# Patient Record
Sex: Male | Born: 1940
Health system: Southern US, Community
[De-identification: ages and names within clinical notes are randomized; demographics above are authoritative.]

## PROBLEM LIST (undated history)

## (undated) DIAGNOSIS — J45909 Unspecified asthma, uncomplicated: Secondary | ICD-10-CM

## (undated) DIAGNOSIS — I739 Peripheral vascular disease, unspecified: Secondary | ICD-10-CM

## (undated) DIAGNOSIS — I2510 Atherosclerotic heart disease of native coronary artery without angina pectoris: Secondary | ICD-10-CM

## (undated) DIAGNOSIS — M545 Low back pain: Secondary | ICD-10-CM

## (undated) DIAGNOSIS — H269 Unspecified cataract: Secondary | ICD-10-CM

## (undated) DIAGNOSIS — N2889 Other specified disorders of kidney and ureter: Secondary | ICD-10-CM

## (undated) DIAGNOSIS — M1990 Unspecified osteoarthritis, unspecified site: Secondary | ICD-10-CM

## (undated) DIAGNOSIS — C189 Malignant neoplasm of colon, unspecified: Secondary | ICD-10-CM

## (undated) DIAGNOSIS — I10 Essential (primary) hypertension: Secondary | ICD-10-CM

## (undated) DIAGNOSIS — I219 Acute myocardial infarction, unspecified: Secondary | ICD-10-CM

## (undated) DIAGNOSIS — Z5189 Encounter for other specified aftercare: Secondary | ICD-10-CM

## (undated) DIAGNOSIS — E785 Hyperlipidemia, unspecified: Secondary | ICD-10-CM

## (undated) DIAGNOSIS — I5032 Chronic diastolic (congestive) heart failure: Secondary | ICD-10-CM

## (undated) DIAGNOSIS — D649 Anemia, unspecified: Secondary | ICD-10-CM

## (undated) HISTORY — DX: Essential (primary) hypertension: I10

## (undated) HISTORY — DX: Other specified disorders of kidney and ureter: N28.89

## (undated) HISTORY — DX: Anemia, unspecified: D64.9

## (undated) HISTORY — DX: Malignant neoplasm of colon, unspecified: C18.9

## (undated) HISTORY — DX: Chronic diastolic (congestive) heart failure: I50.32

## (undated) HISTORY — DX: Low back pain: M54.5

## (undated) HISTORY — PX: POLYPECTOMY: SHX149

## (undated) HISTORY — DX: Unspecified osteoarthritis, unspecified site: M19.90

## (undated) HISTORY — DX: Unspecified asthma, uncomplicated: J45.909

## (undated) HISTORY — DX: Acute myocardial infarction, unspecified: I21.9

## (undated) HISTORY — DX: Peripheral vascular disease, unspecified: I73.9

## (undated) HISTORY — DX: Unspecified cataract: H26.9

## (undated) HISTORY — DX: Encounter for other specified aftercare: Z51.89

## (undated) HISTORY — DX: Hyperlipidemia, unspecified: E78.5

## (undated) HISTORY — PX: COLONOSCOPY: SHX174

## (undated) HISTORY — DX: Atherosclerotic heart disease of native coronary artery without angina pectoris: I25.10

---

## 1995-11-12 HISTORY — PX: OTHER SURGICAL HISTORY: SHX169

## 1996-01-29 ENCOUNTER — Encounter: Payer: Self-pay | Admitting: Internal Medicine

## 2007-02-10 DIAGNOSIS — N2889 Other specified disorders of kidney and ureter: Secondary | ICD-10-CM | POA: Insufficient documentation

## 2007-02-10 HISTORY — DX: Other specified disorders of kidney and ureter: N28.89

## 2007-02-10 HISTORY — PX: NEPHRECTOMY: SHX65

## 2007-02-20 ENCOUNTER — Encounter: Payer: Self-pay | Admitting: Internal Medicine

## 2007-03-23 ENCOUNTER — Encounter: Payer: Self-pay | Admitting: Internal Medicine

## 2007-03-30 ENCOUNTER — Encounter: Payer: Self-pay | Admitting: Internal Medicine

## 2007-05-21 ENCOUNTER — Encounter: Payer: Self-pay | Admitting: Internal Medicine

## 2007-11-26 ENCOUNTER — Encounter: Payer: Self-pay | Admitting: Internal Medicine

## 2007-11-26 LAB — CONVERTED CEMR LAB
Hemoglobin: 14.8 g/dL
Platelets: 259 10*3/uL
TSH: 1.81 microintl units/mL
WBC: 7.3 10*3/uL

## 2008-05-04 ENCOUNTER — Encounter: Payer: Self-pay | Admitting: Internal Medicine

## 2008-06-11 ENCOUNTER — Encounter: Payer: Self-pay | Admitting: Internal Medicine

## 2008-07-14 ENCOUNTER — Encounter: Payer: Self-pay | Admitting: Internal Medicine

## 2008-08-24 ENCOUNTER — Encounter: Payer: Self-pay | Admitting: Internal Medicine

## 2008-08-24 LAB — HM COLONOSCOPY

## 2009-01-11 ENCOUNTER — Encounter: Payer: Self-pay | Admitting: Internal Medicine

## 2009-03-08 LAB — CONVERTED CEMR LAB
ALT: 27 units/L
AST: 25 units/L
Albumin: 4 g/dL
Alkaline Phosphatase: 84 units/L
BUN: 19 mg/dL
CO2: 23 meq/L
Calcium: 9.1 mg/dL
Chloride: 108 meq/L
Cholesterol: 126 mg/dL
Creatinine, Ser: 1.57 mg/dL
Glucose, Bld: 98 mg/dL
HDL: 48 mg/dL
Hgb A1c MFr Bld: 5.9 %
LDL (calc): 62 mg/dL
Potassium: 4.5 meq/L
Sodium: 141 meq/L
Total Bilirubin: 0.7 mg/dL
Total Protein: 6.6 g/dL
Triglyceride fasting, serum: 79 mg/dL

## 2009-03-22 ENCOUNTER — Encounter: Payer: Self-pay | Admitting: Internal Medicine

## 2009-08-30 ENCOUNTER — Ambulatory Visit: Payer: Self-pay | Admitting: Internal Medicine

## 2009-08-30 ENCOUNTER — Telehealth: Payer: Self-pay | Admitting: Internal Medicine

## 2009-08-30 DIAGNOSIS — D649 Anemia, unspecified: Secondary | ICD-10-CM | POA: Insufficient documentation

## 2009-08-30 DIAGNOSIS — N289 Disorder of kidney and ureter, unspecified: Secondary | ICD-10-CM | POA: Insufficient documentation

## 2009-08-30 DIAGNOSIS — Z85038 Personal history of other malignant neoplasm of large intestine: Secondary | ICD-10-CM | POA: Insufficient documentation

## 2009-08-30 DIAGNOSIS — E785 Hyperlipidemia, unspecified: Secondary | ICD-10-CM | POA: Insufficient documentation

## 2009-08-30 DIAGNOSIS — I2510 Atherosclerotic heart disease of native coronary artery without angina pectoris: Secondary | ICD-10-CM | POA: Insufficient documentation

## 2009-08-30 DIAGNOSIS — M545 Low back pain, unspecified: Secondary | ICD-10-CM | POA: Insufficient documentation

## 2009-08-30 DIAGNOSIS — I10 Essential (primary) hypertension: Secondary | ICD-10-CM | POA: Insufficient documentation

## 2009-08-30 DIAGNOSIS — J45909 Unspecified asthma, uncomplicated: Secondary | ICD-10-CM | POA: Insufficient documentation

## 2009-08-30 DIAGNOSIS — J4520 Mild intermittent asthma, uncomplicated: Secondary | ICD-10-CM | POA: Insufficient documentation

## 2009-08-30 DIAGNOSIS — Z8719 Personal history of other diseases of the digestive system: Secondary | ICD-10-CM | POA: Insufficient documentation

## 2009-08-30 DIAGNOSIS — I739 Peripheral vascular disease, unspecified: Secondary | ICD-10-CM | POA: Insufficient documentation

## 2009-09-11 ENCOUNTER — Encounter: Payer: Self-pay | Admitting: Internal Medicine

## 2009-10-11 ENCOUNTER — Telehealth (INDEPENDENT_AMBULATORY_CARE_PROVIDER_SITE_OTHER): Payer: Self-pay | Admitting: *Deleted

## 2009-10-11 DIAGNOSIS — M545 Low back pain, unspecified: Secondary | ICD-10-CM | POA: Insufficient documentation

## 2009-10-11 HISTORY — PX: LUMBAR LAMINECTOMY/DECOMPRESSION MICRODISCECTOMY: SHX5026

## 2009-10-11 HISTORY — DX: Low back pain, unspecified: M54.50

## 2009-10-17 ENCOUNTER — Ambulatory Visit: Payer: Self-pay | Admitting: Internal Medicine

## 2009-10-20 ENCOUNTER — Telehealth: Payer: Self-pay | Admitting: Internal Medicine

## 2009-10-25 ENCOUNTER — Ambulatory Visit (HOSPITAL_COMMUNITY): Admission: RE | Admit: 2009-10-25 | Discharge: 2009-10-25 | Payer: Self-pay | Admitting: Internal Medicine

## 2009-10-26 ENCOUNTER — Telehealth: Payer: Self-pay | Admitting: Internal Medicine

## 2009-10-26 ENCOUNTER — Encounter: Payer: Self-pay | Admitting: Internal Medicine

## 2009-10-27 ENCOUNTER — Ambulatory Visit: Payer: Self-pay | Admitting: Internal Medicine

## 2009-10-27 LAB — CONVERTED CEMR LAB
ALT: 33 units/L (ref 0–53)
AST: 19 units/L (ref 0–37)
Albumin: 3.5 g/dL (ref 3.5–5.2)
Alkaline Phosphatase: 71 units/L (ref 39–117)
BUN: 15 mg/dL (ref 6–23)
Basophils Absolute: 0.1 10*3/uL (ref 0.0–0.1)
Basophils Relative: 1.1 % (ref 0.0–3.0)
Bilirubin, Direct: 0.1 mg/dL (ref 0.0–0.3)
CO2: 29 meq/L (ref 19–32)
Calcium: 9.2 mg/dL (ref 8.4–10.5)
Chloride: 104 meq/L (ref 96–112)
Creatinine, Ser: 1.4 mg/dL (ref 0.4–1.5)
Eosinophils Absolute: 0.4 10*3/uL (ref 0.0–0.7)
Eosinophils Relative: 4.9 % (ref 0.0–5.0)
GFR calc non Af Amer: 53.52 mL/min (ref >60)
Glucose, Bld: 86 mg/dL (ref 70–99)
HCT: 41.3 % (ref 39.0–52.0)
Hemoglobin: 14.2 g/dL (ref 13.0–17.0)
INR: 1 (ref 0.8–1.0)
Lymphocytes Relative: 24 % (ref 12.0–46.0)
Lymphs Abs: 1.7 10*3/uL (ref 0.7–4.0)
MCHC: 34.3 g/dL (ref 30.0–36.0)
MCV: 99.6 fL (ref 78.0–100.0)
Monocytes Absolute: 0.8 10*3/uL (ref 0.1–1.0)
Monocytes Relative: 11.8 % (ref 3.0–12.0)
Neutro Abs: 4.2 10*3/uL (ref 1.4–7.7)
Neutrophils Relative %: 58.2 % (ref 43.0–77.0)
Platelets: 229 10*3/uL (ref 150.0–400.0)
Potassium: 4.4 meq/L (ref 3.5–5.1)
Prothrombin Time: 10.9 s (ref 9.1–11.7)
RBC: 4.15 M/uL — ABNORMAL LOW (ref 4.22–5.81)
RDW: 12.9 % (ref 11.5–14.6)
Sodium: 137 meq/L (ref 135–145)
Total Bilirubin: 1 mg/dL (ref 0.3–1.2)
Total Protein: 6.2 g/dL (ref 6.0–8.3)
WBC: 7.2 10*3/uL (ref 4.5–10.5)
aPTT: 28.3 s (ref 21.7–28.8)

## 2009-11-23 ENCOUNTER — Encounter: Payer: Self-pay | Admitting: Internal Medicine

## 2009-11-28 ENCOUNTER — Ambulatory Visit: Payer: Self-pay | Admitting: Internal Medicine

## 2009-11-29 LAB — CONVERTED CEMR LAB
ALT: 22 units/L (ref 0–53)
AST: 19 units/L (ref 0–37)
Albumin: 3.5 g/dL (ref 3.5–5.2)
Alkaline Phosphatase: 63 units/L (ref 39–117)
BUN: 19 mg/dL (ref 6–23)
Bilirubin, Direct: 0.1 mg/dL (ref 0.0–0.3)
CO2: 25 meq/L (ref 19–32)
Calcium: 9.3 mg/dL (ref 8.4–10.5)
Chloride: 109 meq/L (ref 96–112)
Cholesterol: 209 mg/dL — ABNORMAL HIGH (ref 0–200)
Creatinine, Ser: 1.4 mg/dL (ref 0.4–1.5)
Direct LDL: 159.7 mg/dL
GFR calc non Af Amer: 53.5 mL/min (ref >60)
Glucose, Bld: 96 mg/dL (ref 70–99)
HDL: 36.5 mg/dL — ABNORMAL LOW (ref >39.00)
Potassium: 4.5 meq/L (ref 3.5–5.1)
Sodium: 142 meq/L (ref 135–145)
Total Bilirubin: 1 mg/dL (ref 0.3–1.2)
Total CHOL/HDL Ratio: 6
Total Protein: 6.2 g/dL (ref 6.0–8.3)
Triglycerides: 98 mg/dL (ref 0.0–149.0)
VLDL: 19.6 mg/dL (ref 0.0–40.0)

## 2009-12-06 ENCOUNTER — Telehealth (INDEPENDENT_AMBULATORY_CARE_PROVIDER_SITE_OTHER): Payer: Self-pay | Admitting: *Deleted

## 2009-12-06 ENCOUNTER — Ambulatory Visit: Payer: Self-pay | Admitting: Internal Medicine

## 2009-12-20 ENCOUNTER — Encounter: Payer: Self-pay | Admitting: Internal Medicine

## 2010-01-18 ENCOUNTER — Encounter: Payer: Self-pay | Admitting: Internal Medicine

## 2010-01-26 ENCOUNTER — Ambulatory Visit: Payer: Self-pay | Admitting: Cardiology

## 2010-02-05 ENCOUNTER — Telehealth: Payer: Self-pay | Admitting: Cardiology

## 2010-02-15 ENCOUNTER — Ambulatory Visit (HOSPITAL_COMMUNITY)
Admission: RE | Admit: 2010-02-15 | Discharge: 2010-02-15 | Payer: Self-pay | Source: Home / Self Care | Admitting: Cardiology

## 2010-02-15 ENCOUNTER — Ambulatory Visit: Payer: Self-pay | Admitting: Cardiology

## 2010-02-15 ENCOUNTER — Ambulatory Visit: Payer: Self-pay

## 2010-02-15 ENCOUNTER — Encounter: Payer: Self-pay | Admitting: Cardiology

## 2010-03-21 ENCOUNTER — Ambulatory Visit: Payer: Self-pay | Admitting: Internal Medicine

## 2010-03-21 DIAGNOSIS — I5032 Chronic diastolic (congestive) heart failure: Secondary | ICD-10-CM | POA: Insufficient documentation

## 2010-03-21 LAB — CONVERTED CEMR LAB
ALT: 30 units/L (ref 0–53)
AST: 25 units/L (ref 0–37)
Albumin: 4 g/dL (ref 3.5–5.2)
Alkaline Phosphatase: 80 units/L (ref 39–117)
Bilirubin, Direct: 0.2 mg/dL (ref 0.0–0.3)
Cholesterol: 135 mg/dL (ref 0–200)
HDL: 41.2 mg/dL (ref >39.00)
LDL Cholesterol: 76 mg/dL (ref 0–99)
Total Bilirubin: 1 mg/dL (ref 0.3–1.2)
Total CHOL/HDL Ratio: 3
Total Protein: 6.6 g/dL (ref 6.0–8.3)
Triglycerides: 90 mg/dL (ref 0.0–149.0)
VLDL: 18 mg/dL (ref 0.0–40.0)

## 2010-04-19 ENCOUNTER — Encounter: Payer: Self-pay | Admitting: Internal Medicine

## 2010-05-22 ENCOUNTER — Telehealth: Payer: Self-pay | Admitting: Internal Medicine

## 2010-07-25 ENCOUNTER — Ambulatory Visit: Payer: Self-pay | Admitting: Internal Medicine

## 2010-07-25 ENCOUNTER — Encounter: Payer: Self-pay | Admitting: Internal Medicine

## 2010-07-26 ENCOUNTER — Ambulatory Visit: Payer: Self-pay | Admitting: Oncology

## 2010-07-26 LAB — CONVERTED CEMR LAB
ALT: 29 units/L (ref 0–53)
AST: 21 units/L (ref 0–37)
Albumin: 3.8 g/dL (ref 3.5–5.2)
Alkaline Phosphatase: 73 units/L (ref 39–117)
Bilirubin, Direct: 0.2 mg/dL (ref 0.0–0.3)
Cholesterol: 130 mg/dL (ref 0–200)
HDL: 33.7 mg/dL — ABNORMAL LOW (ref >39.00)
LDL Cholesterol: 82 mg/dL (ref 0–99)
Total Bilirubin: 0.6 mg/dL (ref 0.3–1.2)
Total CHOL/HDL Ratio: 4
Total Protein: 6.3 g/dL (ref 6.0–8.3)
Triglycerides: 70 mg/dL (ref 0.0–149.0)
VLDL: 14 mg/dL (ref 0.0–40.0)

## 2010-08-09 ENCOUNTER — Ambulatory Visit: Payer: Self-pay | Admitting: Cardiology

## 2010-08-27 ENCOUNTER — Ambulatory Visit: Payer: Self-pay | Admitting: Oncology

## 2010-08-28 ENCOUNTER — Encounter: Payer: Self-pay | Admitting: Internal Medicine

## 2010-11-22 ENCOUNTER — Other Ambulatory Visit: Payer: Self-pay | Admitting: Internal Medicine

## 2010-11-22 ENCOUNTER — Ambulatory Visit
Admission: RE | Admit: 2010-11-22 | Discharge: 2010-11-22 | Payer: Self-pay | Source: Home / Self Care | Attending: Internal Medicine | Admitting: Internal Medicine

## 2010-11-22 LAB — HEPATIC FUNCTION PANEL
ALT: 28 U/L (ref 0–53)
AST: 22 U/L (ref 0–37)
Albumin: 3.8 g/dL (ref 3.5–5.2)
Alkaline Phosphatase: 79 U/L (ref 39–117)
Bilirubin, Direct: 0.1 mg/dL (ref 0.0–0.3)
Total Bilirubin: 0.9 mg/dL (ref 0.3–1.2)
Total Protein: 6.6 g/dL (ref 6.0–8.3)

## 2010-11-22 LAB — LIPID PANEL
Cholesterol: 138 mg/dL (ref 0–200)
HDL: 39.7 mg/dL (ref >39.00)
LDL Cholesterol: 82 mg/dL (ref 0–99)
Total CHOL/HDL Ratio: 3
Triglycerides: 84 mg/dL (ref 0.0–149.0)
VLDL: 16.8 mg/dL (ref 0.0–40.0)

## 2010-11-23 ENCOUNTER — Encounter: Payer: Self-pay | Admitting: Internal Medicine

## 2010-11-27 ENCOUNTER — Telehealth (INDEPENDENT_AMBULATORY_CARE_PROVIDER_SITE_OTHER): Payer: Self-pay | Admitting: *Deleted

## 2010-12-10 ENCOUNTER — Ambulatory Visit
Admission: RE | Admit: 2010-12-10 | Discharge: 2010-12-10 | Payer: Self-pay | Source: Home / Self Care | Attending: Internal Medicine | Admitting: Internal Medicine

## 2010-12-10 DIAGNOSIS — K5730 Diverticulosis of large intestine without perforation or abscess without bleeding: Secondary | ICD-10-CM | POA: Insufficient documentation

## 2010-12-11 NOTE — Assessment & Plan Note (Signed)
Summary: 4 MO ROV /NWS  #   Vital Signs:  Patient profile:   70 year old male Height:      71 inches (180.34 cm) Weight:      196.4 pounds (89.27 kg) O2 Sat:      95 % on Room air Temp:     98.0 degrees F (36.67 degrees C) oral Pulse rate:   84 / minute BP sitting:   140 / 84  (left arm) Cuff size:   regular  Vitals Entered By: Orlan Leavens RMA (July 25, 2010 10:37 AM)  O2 Flow:  Room air CC: 4 month follow-up Is Patient Diabetic? No Pain Assessment Patient in pain? no      Comments Req refill on enalapril & d[facility]em [facility]   Primary Care :  Newt Lukes MD  CC:  4 month follow-up.  History of Present Illness: here for followup -  1) low back pain with ruptured disc hx 10/2009 - s/p ortho spine surg 10/2009 -  overall improved - little residual pain, weakness has improved -  playing golf without problems, no longer in PT  2) HTN -  reports compliance with ongoing medical treatment and no changes in medication dose or frequency. denies adverse side effects related to current therapy.  no CP or edema - "BP always up at the doc's office - white coat"  3) dyslipidemia - resumed lipitor 11/2009, reports 100% med compliance - denies weakness or muscle pain, no adverse side effects - fasting for lab recheck today  4) CAD hx - no angina - did well w/o problem periop 10/2009 - reports compliance with ongoing medical treatment and no changes in medication dose or frequency. denies adverse side effects related to current therapy.  s/p review by cards 02/2009 - echo with no wma and norm lv ef  5) hx right renal mass 2008 - benign path s/p total nephrect (attempt at partial unsuccessful due to size) subsquent mild renal insuff by labs (on review) annually checks with uro for same - 04/2010 uro eval showed normal [place] - to reck 2 years  would like onc eval due to hx colon ca 1998 and renal growth/surg 2008 -  prev followed annually with onc in  [place]   Clinical Review Panels:  Lipid Management   Cholesterol:  135 (03/21/2010)   LDL (bad choesterol):  76 (03/21/2010)   HDL (good cholesterol):  41.20 (03/21/2010)   Triglycerides:  79 (03/08/2009)  CBC   WBC:  7.2 (10/27/2009)   RBC:  4.15 (10/27/2009)   Hgb:  14.2 (10/27/2009)   Hct:  41.3 (10/27/2009)   Platelets:  229.0 (10/27/2009)   MCV  99.6 (10/27/2009)   MCHC  34.3 (10/27/2009)   RDW  12.9 (10/27/2009)   PMN:  58.2 (10/27/2009)   Lymphs:  24.0 (10/27/2009)   Monos:  11.8 (10/27/2009)   Eosinophils:  4.9 (10/27/2009)   Basophil:  1.1 (10/27/2009)  Complete Metabolic Panel   Glucose:  96 (11/28/2009)   Sodium:  142 (11/28/2009)   Potassium:  4.5 (11/28/2009)   Chloride:  109 (11/28/2009)   CO2:  25 (11/28/2009)   BUN:  19 (11/28/2009)   Creatinine:  1.4 (11/28/2009)   Albumin:  4.0 (03/21/2010)   Total Protein:  6.6 (03/21/2010)   Calcium:  9.3 (11/28/2009)   Total Bili:  1.0 (03/21/2010)   Alk Phos:  80 (03/21/2010)   SGPT (ALT):  30 (03/21/2010)   SGOT (AST):  25 (03/21/2010)  Current Medications (verified): 1)  Enalapril Maleate 20 Mg Tabs (Enalapril Maleate) .... Take 1 Po Qd 2)  Diltiazem Hcl Coated Beads 180 Mg Xr24h-Cap (Diltiazem Hcl Coated Beads) .... Take 1 By Mouth Qd 3)  Bayer Low Strength 81 Mg Tbec (Aspirin) .... Take 1 By Mouth Qd 4)  Centrum Silver  Tabs (Multiple Vitamins-Minerals) .... Take 1 By Mouth Qd 5)  Proair Hfa 108 (90 Base) Mcg/act Aers (Albuterol Sulfate) .... Inhale 2 Puff Four Times A Day As Needed 6)  Lipitor 10 Mg Tabs (Atorvastatin Calcium) .Marland Kitchen.. 1 By Mouth At Bedtime 7)  Fish Oil Omega 3 2000mg  .... 1 By Mouth Once Daily  Allergies (verified): 1)  ! Penicillin  Past History:  Past Medical History: 1. Colon cancer - adenoca 1997 s/p rectosigmoid resection. 2. Coronary artery disease - 2 vessel disease by cath in 4/08 in [place]: 70% mid LAD, 90% dist RCA managed medically; LVEF 55%.  ETT-myoview 8/09 - fixed  deficit at basal inferior segment (small), low risk 3. PAD - Abnormal ABIs in 9/09 per patient.  He says a further workup was done (he is not sure what) and he was told things were "ok"   4. Diverticulitis, hx of 5. Hyperlipidemia 6. Hypertension 7. 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  8. Asthma 9. Low back pain s/p ruptured disc, had spine surgery in 12/10.   MD roster - ortho spine - cohen cards - Philbert Riser - wrenn  Review of Systems  The patient denies weight loss, chest pain, syncope, and headaches.    Physical Exam  General:  alert, well-developed, well-nourished, and cooperative to examination.   Lungs:  normal respiratory effort, no intercostal retractions or use of accessory muscles; normal breath sounds bilaterally - no crackles and no wheezes.    Heart:  normal rate, regular rhythm, no murmur, and no rub. BLE without edema.  Psych:  Oriented X3, memory intact for recent and remote, normally interactive, good eye contact, not anxious appearing, not depressed appearing, and not agitated.      Impression & Recommendations:  Problem # 1:  HYPERTENSION (ICD-401.9) always "white coat" - reports 120s/60s at home His updated medication list for this problem includes:    Enalapril Maleate 20 Mg Tabs (Enalapril maleate) .Marland Kitchen... Take 1 po qd    Diltiazem Hcl Coated Beads 180 Mg Xr24h-cap (Diltiazem hcl coated beads) .Marland Kitchen... Take 1 by mouth qd  BP today: 140/84 Prior BP: 142/80 (03/21/2010)  Labs Reviewed: K+: 4.5 (11/28/2009) Creat: : 1.4 (11/28/2009)   Chol: 135 (03/21/2010)   HDL: 41.20 (03/21/2010)   LDL: 76 (03/21/2010)   TG: 90.0 (03/21/2010)  Problem # 2:  HYPERLIPIDEMIA (ICD-272.4)  His updated medication list for this problem includes:    Lipitor 10 Mg Tabs (Atorvastatin calcium) .Marland Kitchen... 1 by mouth at bedtime  Orders: TLB-Lipid Panel (80061-LIPID) TLB-Lipid Panel (80061-LIPID)  Labs Reviewed: SGOT: 25 (03/21/2010)   SGPT: 30 (03/21/2010)    HDL:41.20 (03/21/2010), 36.50 (11/28/2009)  LDL:76 (03/21/2010), 62 (03/08/2009)  Chol:135 (03/21/2010), 209 (11/28/2009)  Trig:90.0 (03/21/2010), 98.0 (11/28/2009)  Problem # 3:  CORONARY ARTERY DISEASE (ICD-414.00)  His updated medication list for this problem includes:    Enalapril Maleate 20 Mg Tabs (Enalapril maleate) .Marland Kitchen... Take 1 po qd    Diltiazem Hcl Coated Beads 180 Mg Xr24h-cap (Diltiazem hcl coated beads) .Marland Kitchen... Take 1 by mouth qd    Bayer Low Strength 81 Mg Tbec (Aspirin) .Marland Kitchen... Take 1 by mouth qd  per last cards noted  02/2010: Stable with no ischemic symptoms.  Last myoview in 8/09 was low-risk.  Needs aggressive risk factor modification (BP control and lipids control).  Continue ASA, Lipitor, ACEI.  echocardiogram spring 2011: normal LV EF, grade 2 disat dysfx  Labs Reviewed: Chol: 209 (11/28/2009)   HDL: 36.50 (11/28/2009)   LDL: 62 (03/08/2009)   TG: 98.0 (11/28/2009)  Problem # 4:  KIDNEY DISEASE (ICD-593.9)  Orders: Oncology Referral (Oncology)  s/p right nephrectomy 5/08 b/c benign mass -  Cr elev but stable over past 12 mos reviewed uro consult 04/2010 - planned f/u q 2years to ensure stability (followed annually in [place] before moving here)  Problem # 5:  PERSONAL HISTORY MALIG NEOPLASM LARGE INTESTINE (ICD-V10.05)  Orders: Oncology Referral (Oncology)  last colonoscopy 2009 reviewed - no recurrence and neg CEA on labs - chart reviewed, copied into EMR prior for onc f/u at pt request  Problem # 6:  LUMBAGO (ICD-724.2)  His updated medication list for this problem includes:    Bayer Low Strength 81 Mg Tbec (Aspirin) .Marland Kitchen... Take 1 by mouth qd  s/p decompression surg 10/2009 - cohen - improved - cont activity as tol  Complete Medication List: 1)  Enalapril Maleate 20 Mg Tabs (Enalapril maleate) .... Take 1 po qd 2)  Diltiazem Hcl Coated Beads 180 Mg Xr24h-cap (Diltiazem hcl coated beads) .... Take 1 by mouth qd 3)  Bayer Low Strength 81 Mg Tbec (Aspirin) ....  Take 1 by mouth qd 4)  Centrum Silver Tabs (Multiple vitamins-minerals) .... Take 1 by mouth qd 5)  Proair Hfa 108 (90 Base) Mcg/act Aers (Albuterol sulfate) .... Inhale 2 puff four times a day as needed 6)  Lipitor 10 Mg Tabs (Atorvastatin calcium) .Marland Kitchen.. 1 by mouth at bedtime 7)  Fish Oil Omega 3 2000mg   .... 1 by mouth once daily  Other Orders: TLB-Hepatic/Liver Function Pnl (80076-HEPATIC) TLB-Hepatic/Liver Function Pnl (80076-HEPATIC)  Patient Instructions: 1)  it was good to see you today. 2)  test(s) ordered today - your results will be posted on the phone tree for review in 48-72 hours from the time of test completion; call [phone] and enter your 9 digit MRN (listed above on this page, just below your name); if any changes need to be made or there are abnormal results, you will be contacted directly.  3)  we'll make referral to oncology. Our office will contact you regarding this appointment once made.  4)  Please schedule a follow-up appointment in 4 months, sooner if problems. Prescriptions: PROAIR HFA 108 (90 BASE) MCG/ACT AERS (ALBUTEROL SULFATE) inhale 2 puff four times a day as needed  #4 x 1   Entered by:   Orlan Leavens RMA   Authorized by:   Newt Lukes MD   Signed by:   Orlan Leavens RMA on 07/25/2010   Method used:   Faxed to ...       [facility]* (mail-order)             , [place]         Ph: [phone]       Fax: [phone]   RxID:   [id number] DILTIAZEM HCL COATED BEADS 180 MG XR24H-CAP (DILTIAZEM HCL COATED BEADS) take 1 by mouth qd  #90 x 3   Entered by:   Orlan Leavens RMA   Authorized by:   Newt Lukes MD   Signed by:   Orlan Leavens RMA on 07/25/2010   Method used:   Faxed to .Marland KitchenMarland Kitchen  [facility]* (mail-order)             , [place]         Ph: [phone]       Fax: [phone]   RxID:   [id number] ENALAPRIL MALEATE 20 MG TABS (ENALAPRIL MALEATE) take 1 po qd  #90 x 3   Entered by:   Orlan Leavens RMA   Authorized by:   Newt Lukes  MD   Signed by:   Orlan Leavens RMA on 07/25/2010   Method used:   Faxed to ...       [facility]* (mail-order)             , [place]         Ph: [phone]       Fax: [phone]   RxID:   [id number]

## 2010-12-11 NOTE — Progress Notes (Signed)
Summary: B/P readings  Phone Note Outgoing Call   Call placed by: Katina Dung, RN, BSN,  February 05, 2010 8:30 AM Call placed to: Patient Summary of Call: B/P readings  Follow-up for Phone Call        get B/P readings--B/P 148/78 in office 01-26-10 [facility] Katina Dung, RN, BSN  February 05, 2010 10:43 AM  talked with pt 01/26/10 127/65 01/27/10 121/61 01/28/10 118/66 01/29/10 129/65 01/30/10 115/64 01/31/10 117/63 02/01/10 1115/60 02/02/10 124/69 02/03/10 114/62 02/04/10 127/65 pt without complaints will forward to Dr Shirlee Latch for review     Appended Document: B/P readings looks good, continue current meds.   Appended Document: B/P readings discussed with patient

## 2010-12-11 NOTE — Letter (Signed)
Summary: [facility]   Imported By: Sherian Rein 04/24/2010 10:19:32  _____________________________________________________________________  External Attachment:    Type:   Image     Comment:   External Document

## 2010-12-11 NOTE — Progress Notes (Signed)
----   Converted from flag ---- ---- 12/06/2009 11:52 AM, Edman Circle wrote: appt 2/8 @ 3:30 with Shirlee Latch  ---- 12/06/2009 11:01 AM, Dagoberto Reef wrote: Thanks  ---- 12/06/2009 10:57 AM, Newt Lukes MD wrote: The following orders have been entered for this patient and placed on Admin Hold:  Type:     Referral       Code:   Cardiology Description:   Cardiology Referral Order Date:   12/06/2009   Authorized By:   Newt Lukes MD Order #:   [id number] Clinical Notes:   [facility] cards - eval and tx CAD ------------------------------

## 2010-12-11 NOTE — Progress Notes (Signed)
Summary: pro air  Phone Note Refill Request Message from:  Patient wife on May 22, 2010 10:32 AM  Refills Requested: Medication #1:  PROAIR HFA 108 (90 BASE) MCG/ACT AERS use prn requesting [facility]  Initial call taken by: Orlan Leavens,  May 22, 2010 10:33 AM    New/Updated Medications: PROAIR HFA 108 (90 BASE) MCG/ACT AERS (ALBUTEROL SULFATE) inhale 2 puff four times a day as needed Prescriptions: PROAIR HFA 108 (90 BASE) MCG/ACT AERS (ALBUTEROL SULFATE) inhale 2 puff four times a day as needed  #1 x 2   Entered by:   Orlan Leavens   Authorized by:   Newt Lukes MD   Signed by:   Orlan Leavens on 05/22/2010   Method used:   Faxed to ...       [facility] [employer])             , [place]         Ph: [phone]       Fax: [phone]   RxID:   [id number]

## 2010-12-11 NOTE — Assessment & Plan Note (Signed)
Summary: f11m   Visit Type:  Follow-up Primary :  Newt Lukes MD  CC:  no complaints.  History of Present Illness: 70 yo with history of CAD, colon cancer, HTN, and right nephrectomy presentsfor cardiology followup.  Patient had pre-op testing before nephrectomy in 2008.  Stress test was suggestive of coronary disease.  He had never had chest pain or exertional dyspnea.  Cardiac cath was done showing 70% mid LAD and 90% distal RCA.  Given lack of symptoms, he was managed medically.  Since that time, he has had no cardiac problems. He had a low risk myoview in 2009.  He still has never had chest pain.  He walks for 2 miles on occasion for exercise with no exertional dyspnea.   He was not as active over the summer because it was too hot.  His lipid panel reflects this: excellent in 5/11, a little worse in 9/11.  Echo in 4/11 showed preserved LV systolic function and moderate diastolic dysfunction.  BP is good today.  No claudication.     Labs (1/11): creatinine 1.4, K 4.5, HDL 37, LDL 160 (off Lipitor) Labs (5/11): LDL 76, HDL 41 Labs (9/11): LDL 82, HDL 33.7  ECG: NSR, LAE, otherwise normal  Current Medications (verified): 1)  Enalapril Maleate 20 Mg Tabs (Enalapril Maleate) .... Take 1 Po Qd 2)  Diltiazem Hcl Coated Beads 180 Mg Xr24h-Cap (Diltiazem Hcl Coated Beads) .... Take 1 By Mouth Qd 3)  Bayer Low Strength 81 Mg Tbec (Aspirin) .... Take 1 By Mouth Qd 4)  Centrum Silver  Tabs (Multiple Vitamins-Minerals) .... Take 1 By Mouth Qd 5)  Proair Hfa 108 (90 Base) Mcg/act Aers (Albuterol Sulfate) .... Inhale 2 Puff Four Times A Day As Needed 6)  Lipitor 10 Mg Tabs (Atorvastatin Calcium) .Marland Kitchen.. 1 By Mouth At Bedtime 7)  Fish Oil Omega 3 2000mg  .... 1 By Mouth Once Daily  Allergies (verified): 1)  ! Penicillin  Past History:  Past Medical History: 1. Colon cancer - adenoca 1997 s/p rectosigmoid resection. 2. Coronary artery disease - 2 vessel disease by cath in 4/08 in [place]: 70% mid LAD, 90% dist RCA managed medically; LVEF 55%.  ETT-myoview 8/09 - fixed deficit at basal inferior segment (small), low risk.  Echo (4/11): EF 55-60%, no regional wall motion abnormalities, moderate diastolic dysfunction, normal RV and normal PA pressure.  3. PAD - Abnormal ABIs in 9/09 per patient.  He says a further workup was done (he is not sure what) and he was told things were "ok"   4. Diverticulitis, hx of 5. Hyperlipidemia 6. Hypertension 7. 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  8. Asthma 9. Low back pain s/p ruptured disc, had spine surgery in 12/10.   MD roster - ortho spine - cohen cards - Philbert Riser - wrenn  Vital Signs:  Patient profile:   70 year old male Height:      71 inches Weight:      198.50 pounds BMI:     27.79 Pulse rate:   72 / minute BP sitting:   134 / 74  (left arm) Cuff size:   large  Vitals Entered By: Caralee Ates CMA (August 09, 2010 11:40 AM)  Physical Exam  General:  Well developed, well nourished, in no acute distress. Neck:  Neck supple, no JVD. No masses, thyromegaly or abnormal cervical nodes. Lungs:  Clear bilaterally to auscultation and percussion. Heart:  Non-displaced PMI, chest non-tender; regular rate and rhythm,  S1, S2 without murmurs, rubs or gallops. Carotid upstroke normal, no bruit.  Pedals normal pulses. No edema, no varicosities. Abdomen:  Bowel sounds positive; abdomen soft and non-tender without masses, organomegaly, or hernias noted. No hepatosplenomegaly. Extremities:  No clubbing or cyanosis. Neurologic:  Alert and oriented x 3. Psych:  Normal affect.   Impression & Recommendations:  Problem # 1:  CORONARY ARTERY DISEASE (ICD-414.00) Stable with no ischemic symptoms.  Last myoview in 8/09 was low-risk.  Needs aggressive risk factor modification (BP control and lipids control).  Continue ASA, Lipitor, ACEI.  EF preserved on echo in 4/11.   Problem # 2:  HYPERLIPIDEMIA  (ICD-272.4) LDL a little higher and HDL a little lower in 9/11 than in 5/11.  Goal LDL < 70 given CAD.  He needs to get back into regular exercise and watch his diet.   Problem # 3:  HYPERTENSION (ICD-401.9) BP at goal on current regimen.   Patient Instructions: 1)  Your physician wants you to follow-up in: 1 year with Dr Shirlee Latch.   You will receive a reminder letter in the mail two months in advance. If you don't receive a letter, please call our office to schedule the follow-up appointment.

## 2010-12-11 NOTE — Consult Note (Signed)
Summary: [facility]ine [facility] Scoliosis   Lum[facility] Spine Pain/Spine & Scoliosis   Imported By: Sherian Rein 11/27/2009 11:53:30  _____________________________________________________________________  External Attachment:    Type:   Image     Comment:   External Document

## 2010-12-11 NOTE — Assessment & Plan Note (Signed)
Summary: 3 MO ROV /NWS #   Vital Signs:  Patient profile:   70 year old male Height:      71 inches (180.34 cm) Weight:      191.0 pounds (86.82 kg) O2 Sat:      97 % on Room air Temp:     99.0 degrees F (37.22 degrees C) oral Pulse rate:   83 / minute BP sitting:   148 / 86  (left arm) Cuff size:   large  Vitals Entered By: Orlan Leavens (December 06, 2009 10:26 AM)  O2 Flow:  Room air CC: 3 month follow-up Is Patient Diabetic? No Pain Assessment Patient in pain? no        Primary Care Provider:  Newt Lukes MD  CC:  3 month follow-up.  History of Present Illness: here for followup -  1)low back pain with ruptured disc - s/p ortho spine surg 10/2009 - now doing well - no residual pain, weakness has improved  2) HTN -  reports compliance with ongoing medical treatment and no changes in medication dose or frequency. denies adverse side effects related to current therapy.   3) dyslipidemia - stopped lipitor>1 mo ago due to muscl skel pain - as pain/weakness related to neurosurg issue, willing to resume statin now -   4) CAD hx - no angina - did well w/o probs periop 10/2009 - reports compliance with ongoing medical treatment and no changes in medication dose or frequency. denies adverse side effects related to current therapy.  would like review by cards for maintence  Current Medications (verified): 1)  Enalapril Maleate 20 Mg Tabs (Enalapril Maleate) .... Take 1 Po Qd 2)  Diltiazem Hcl Coated Beads 180 Mg Xr24h-Cap (Diltiazem Hcl Coated Beads) .... Take 1 By Mouth Qd 3)  Bayer Low Strength 81 Mg Tbec (Aspirin) .... Take 1 By Mouth Qd 4)  Centrum Silver  Tabs (Multiple Vitamins-Minerals) .... Take 1 By Mouth Qd 5)  Proair Hfa 108 (90 Base) Mcg/act Aers (Albuterol Sulfate) .... Use Prn  Allergies (verified): 1)  ! Penicillin  Past History:  Past Medical History: colon cancer - adenoca 1997 s/p rectsig recection Coronary artery disease - 2v cath 4/08:  70%midLAD, 90% dist RCA; LVEF 55%    exercise stress test 8/09 - fixed deficit at basal inf segment (small), low risk PAD -LLE - ABI 9/09 Diverticulitis, hx of Hyperlipidemia Hypertension hx renal mass (benign- oncocytoma) 4/08 hx asthma  MD rooster - ortho spine - cohen cards - ([facility]) uro -  Past Surgical History: right kidney removed (03/2007) rectosigmoidectomy (1997) lumbar decompression/laminectomy (10/2009 - cohen)  Review of Systems  The patient denies anorexia, fever, weight loss, chest pain, syncope, dyspnea on exertion, and headaches.    Physical Exam  General:  alert, well-developed, well-nourished, and cooperative to examination.   Lungs:  normal respiratory effort, no intercostal retractions or use of accessory muscles; normal breath sounds bilaterally - no crackles and no wheezes.    Heart:  normal rate, regular rhythm, no murmur, and no rub. BLE without edema.  Psych:  Oriented X3, memory intact for recent and remote, normally interactive, good eye contact, not anxious appearing, not depressed appearing, and not agitated.      Impression & Recommendations:  Problem # 1:  HYPERLIPIDEMIA (ICD-272.4)  labs reviewed - considerable adverse trend in FLP off meds -  will resume statin now - recheck 6-38mos His updated medication list for this problem includes:    Lipitor 10 Mg  Tabs (Atorvastatin calcium) .Marland Kitchen... 1 by mouth at bedtime  Labs Reviewed: SGOT: 19 (11/28/2009)   SGPT: 22 (11/28/2009)   HDL:36.50 (11/28/2009), 48 (03/08/2009)  LDL:62 (03/08/2009)  Chol:209 (11/28/2009), 126 (03/08/2009)  Trig:98.0 (11/28/2009), 79 (03/08/2009)  Problem # 2:  HYPERTENSION (ICD-401.9)  His updated medication list for this problem includes:    Enalapril Maleate 20 Mg Tabs (Enalapril maleate) .Marland Kitchen... Take 1 po qd    Diltiazem Hcl Coated Beads 180 Mg Xr24h-cap (Diltiazem hcl coated beads) .Marland Kitchen... Take 1 by mouth qd  BP today: 148/86 Prior BP: 128/62 (10/27/2009)  Labs  Reviewed: K+: 4.5 (11/28/2009) Creat: : 1.4 (11/28/2009)   Chol: 209 (11/28/2009)   HDL: 36.50 (11/28/2009)   LDL: 62 (03/08/2009)   TG: 98.0 (11/28/2009)  Problem # 3:  CORONARY ARTERY DISEASE (ICD-414.00)  His updated medication list for this problem includes:    Enalapril Maleate 20 Mg Tabs (Enalapril maleate) .Marland Kitchen... Take 1 po qd    Diltiazem Hcl Coated Beads 180 Mg Xr24h-cap (Diltiazem hcl coated beads) .Marland Kitchen... Take 1 by mouth qd    Bayer Low Strength 81 Mg Tbec (Aspirin) .Marland Kitchen... Take 1 by mouth qd  cath report 4/08 and stress test 9/09 reviewed no active symptoms -  cont med mgmt as ongoing will refer to cards locally  Orders: Cardiology Referral (Cardiology)  Problem # 4:  LUMBAGO (ICD-724.2) s/p decompression surg 10/2009 - cohen - improved - cont activity as tol His updated medication list for this problem includes:    Bayer Low Strength 81 Mg Tbec (Aspirin) .Marland Kitchen... Take 1 by mouth qd  Problem # 5:  KIDNEY DISEASE (ICD-593.9)  s/p right nephrectomy 5/08 b/c benign mass -  Cr elev but stable over past 12 mos plan refer for eval by uro in future OV to ensure stability (followed annually in [place] before moving here)  Complete Medication List: 1)  Enalapril Maleate 20 Mg Tabs (Enalapril maleate) .... Take 1 po qd 2)  Diltiazem Hcl Coated Beads 180 Mg Xr24h-cap (Diltiazem hcl coated beads) .... Take 1 by mouth qd 3)  Bayer Low Strength 81 Mg Tbec (Aspirin) .... Take 1 by mouth qd 4)  Centrum Silver Tabs (Multiple vitamins-minerals) .... Take 1 by mouth qd 5)  Proair Hfa 108 (90 Base) Mcg/act Aers (Albuterol sulfate) .... Use prn 6)  Lipitor 10 Mg Tabs (Atorvastatin calcium) .Marland Kitchen.. 1 by mouth at bedtime  Patient Instructions: 1)  it was good to see you today.  2)  resume Lipitor now as discussed - 3)  we'll make referral to [facility] cardiology. Our office will contact you regarding this appointment once made.  4)  Please schedule a follow-up appointment in 3-4 months, sooner if  problems.

## 2010-12-11 NOTE — Assessment & Plan Note (Signed)
Summary: np6/CAD/jml   Referring Provider:  Rene Paci, MD Primary Provider:  Newt Lukes MD  CC:  new patient with hx of silent heart attack in 2008.  Pt new to area and establishing.  Pt has no cardiac concerns at this time.Marland Kitchen  History of Present Illness: 70 yo with history of CAD, colon cancer, HTN, and right nephrectomy presents to establish cardiology care.  Patient had pre-op testing before nephrectomy in 2008.  Stress test was suggestive of coronary disease.  He had never had chest pain or exertional dyspnea.  Cardiac cath was done showing 70% mid LAD and 90% distal RCA.  Given lack of symptoms, he was managed medically.  Since that time, he has had no cardiac problems. He had a low risk myoview in 2009.  He still has never had chest pain.  He walks for 2 miles several times a week for exercise with no exertional dyspnea.  He was diagnosed with PAD by ABIs in 2009.  He had some form of followup testing (he does not remember what) and was told everything was "ok."  He has no claudication-type symptoms.  Patient had recent spinal surgery in 12/10 which he tolerated without problems.  He stopped his statin this winter due to concern that it was contributing to his back pain, so his last LDL was quite high.  He has subsequently restarted his statin.    BP is a bit high today but per patient has been running 115-125/65-70 at home.   Labs (1/11): creatinine 1.4, K 4.5, HDL 37, LDL 160 (off Lipitor)  ECG: NSR, slight inferior Qs  Current Medications (verified): 1)  Enalapril Maleate 20 Mg Tabs (Enalapril Maleate) .... Take 1 Po Qd 2)  Diltiazem Hcl Coated Beads 180 Mg Xr24h-Cap (Diltiazem Hcl Coated Beads) .... Take 1 By Mouth Qd 3)  Bayer Low Strength 81 Mg Tbec (Aspirin) .... Take 1 By Mouth Qd 4)  Centrum Silver  Tabs (Multiple Vitamins-Minerals) .... Take 1 By Mouth Qd 5)  Proair Hfa 108 (90 Base) Mcg/act Aers (Albuterol Sulfate) .... Use Prn 6)  Lipitor 10 Mg Tabs  (Atorvastatin Calcium) .Marland Kitchen.. 1 By Mouth At Bedtime  Allergies (verified): 1)  ! Penicillin  Past History:  Past Medical History: 1. Colon cancer - adenoca 1997 s/p rectosigmoid resection. 2. Coronary artery disease - 2 vessel disease by cath in 4/08 in [place]: 70% mid LAD, 90% dist RCA managed medically; LVEF 55%.  ETT-myoview 8/09 - fixed deficit at basal inferior segment (small), low risk 3. PAD - Abnormal ABIs in 9/09 per patient.  He says a further workup was done (he is not sure what) and he was told things were "ok."   4. Diverticulitis, hx of 5. Hyperlipidemia 6. Hypertension 7. Renal mass (benign- oncocytoma) 4/08: had right nephrectomy.  8. Asthma 9. Low back pain s/p ruptured disc, had spine surgery in 12/10.   MD rooster - ortho spine - cohen cards - ([facility]) uro -  Family History: Reviewed history from 08/30/2009 and no changes required. Family History of Alcoholism/Addiction (parent) Family History of Colon CA 1st degree relative <60 (parent) Heart disease (parent)  dad expired age 53 - MI mom expired age 25 - colon ca  Social History: Never smoked occ social alcohol use married, lives with wife, retired 2007 moved from [place] to [place] 04/2009  Review of Systems       All systems reviewed and negative except as per HPI.   Vital Signs:  Patient profile:   70 year old male Height:      71 inches Weight:      194 pounds BMI:     27.16 Pulse rate:   83 / minute Pulse rhythm:   regular BP sitting:   148 / 78  (left arm) Cuff size:   regular  Vitals Entered By: Judithe Modest CMA (January 26, 2010 10:46 AM)  Physical Exam  General:  Well developed, well nourished, in no acute distress. Head:  normocephalic and atraumatic Nose:  no deformity, discharge, inflammation, or lesions Mouth:  Teeth, gums and palate normal. Oral mucosa normal. Neck:  Neck supple, no JVD. No masses, thyromegaly or abnormal cervical nodes. Lungs:  Clear bilaterally to  auscultation and percussion. Heart:  Non-displaced PMI, chest non-tender; regular rate and rhythm, S1, S2 without murmurs, rubs. +S4. Carotid upstroke normal, no bruit.  Pedals normal pulses. No edema, no varicosities. Abdomen:  Bowel sounds positive; abdomen soft and non-tender without masses, organomegaly, or hernias noted. No hepatosplenomegaly. Msk:  Back normal, normal gait. Muscle strength and tone normal. Extremities:  No clubbing or cyanosis. Neurologic:  Alert and oriented x 3. Skin:  Intact without lesions or rashes. Psych:  Normal affect.   Impression & Recommendations:  Problem # 1:  CORONARY ARTERY DISEASE (ICD-414.00) Stable with no ischemic symptoms.  Last myoview in 8/09 was low-risk.  Needs aggressive risk factor modification (BP control and lipids control).  Continue ASA, Lipitor, ACEI.  I will get an echocardiogram to assess LV systolic function.    Problem # 2:  HYPERLIPIDEMIA (ICD-272.4) LDL much too high off Lipitor.  He has restarted Lipitor and needs followup lipids in 5/11 (ordered already by Dr. Felicity Coyer per patient).  Goal LDL < 70.   Problem # 3:  HYPERTENSION (ICD-401.9) BP is too high today but may be stress response to visiting a new doctor.  He will check and record BP daily for the next week.  We will call him to see what pressure is running at home.    Other Orders: EKG w/ Interpretation (93000) Echocardiogram (Echo)  Patient Instructions: 1)  Your physician has requested that you have an echocardiogram.  Echocardiography is a painless test that uses sound waves to create images of your heart. It provides your doctor with information about the size and shape of your heart and how well your heart's chambers and valves are working.  This procedure takes approximately one hour. There are no restrictions for this procedure. 2)  Take and record your blood pressure and pulse --I will call you in a week and get the readings Luana Shu [phone]  3)   Your physician wants you to follow-up in: 6 months with Dr Shirlee Latch.  You will receive a reminder letter in the mail two months in advance. If you don't receive a letter, please call our office to schedule the follow-up appointment.

## 2010-12-11 NOTE — Letter (Signed)
Summary: [facility]  [facility]   Imported By: Sherian Rein 09/07/2010 09:18:19  _____________________________________________________________________  External Attachment:    Type:   Image     Comment:   External Document

## 2010-12-11 NOTE — Miscellaneous (Signed)
Summary: PT Discharge Summary/[facility]  PT Discharge Summary/[facility]   Imported By: Sherian Rein 01/01/2010 13:52:55  _____________________________________________________________________  External Attachment:    Type:   Image     Comment:   External Document

## 2010-12-11 NOTE — Assessment & Plan Note (Signed)
Summary: 3-4 MO ROV /NWS  #   Vital Signs:  Patient profile:   70 year old male Height:      71 inches Weight:      194.13 pounds BMI:     27.17 O2 Sat:      97 % on Room air Temp:     9.4 degrees F oral Pulse rate:   88 / minute BP sitting:   142 / 80  (left arm) Cuff size:   regular  Vitals Entered By: Margaret Pyle, CMA (Mar 21, 2010 10:50 AM)  O2 Flow:  Room air  Primary Care Provider:  Newt Lukes MD   History of Present Illness: here for followup -  1) low back pain with ruptured disc hx 10/2009 - s/p ortho spine surg 10/2009 - now doing well - no residual pain, weakness has improved - playing golf without problems, no longer in PT  2) HTN -  reports compliance with ongoing medical treatment and no changes in medication dose or frequency. denies adverse side effects related to current therapy.  no CP or edema - "BP always up at the doc's office"  3) dyslipidemia - resumed lipitor 11/2009, reports 100% med compliance - denies weakness or muscle pain, no adverse side effects - fasting for lab recheck today  4) CAD hx - no angina - did well w/o problem periop 10/2009 - reports compliance with ongoing medical treatment and no changes in medication dose or frequency. denies adverse side effects related to current therapy.  s/p review by cards 02/2009 - echo with no wma and norm lv ef  5) hx right renal mass 2008 - benign path s/p total nephrect (attempt at partial unsuccessful due to size) subsquent mild renal insuff by labs (on review) annually checks with uro for same - overdue at this time   Clinical Review Panels:  Prevention   Last Colonoscopy:  Done @ [facility]  Hx of colon cancer  Impression: 1. Internal hemorrhoids 2. diverticulosis 3. a few diverticuli colon (08/24/2008)  Lipid Management   Cholesterol:  209 (11/28/2009)   HDL (good cholesterol):  36.50 (11/28/2009)   Triglycerides:  79 (03/08/2009)  CBC  WBC:  7.2 (10/27/2009)   RBC:  4.15 (10/27/2009)   Hgb:  14.2 (10/27/2009)   Hct:  41.3 (10/27/2009)   Platelets:  229.0 (10/27/2009)   MCV  99.6 (10/27/2009)   MCHC  34.3 (10/27/2009)   RDW  12.9 (10/27/2009)   PMN:  58.2 (10/27/2009)   Lymphs:  24.0 (10/27/2009)   Monos:  11.8 (10/27/2009)   Eosinophils:  4.9 (10/27/2009)   Basophil:  1.1 (10/27/2009)  Complete Metabolic Panel   Glucose:  96 (11/28/2009)   Sodium:  142 (11/28/2009)   Potassium:  4.5 (11/28/2009)   Chloride:  109 (11/28/2009)   CO2:  25 (11/28/2009)   BUN:  19 (11/28/2009)   Creatinine:  1.4 (11/28/2009)   Albumin:  3.5 (11/28/2009)   Total Protein:  6.2 (11/28/2009)   Calcium:  9.3 (11/28/2009)   Total Bili:  1.0 (11/28/2009)   Alk Phos:  63 (11/28/2009)   SGPT (ALT):  22 (11/28/2009)   SGOT (AST):  19 (11/28/2009)   Current Medications (verified): 1)  Enalapril Maleate 20 Mg Tabs (Enalapril Maleate) .... Take 1 Po Qd 2)  Diltiazem Hcl Coated Beads 180 Mg Xr24h-Cap (Diltiazem Hcl Coated Beads) .... Take 1 By Mouth Qd 3)  Bayer Low Strength 81 Mg Tbec (Aspirin) .... Take 1 By Mouth Qd  4)  Centrum Silver  Tabs (Multiple Vitamins-Minerals) .... Take 1 By Mouth Qd 5)  Proair Hfa 108 (90 Base) Mcg/act Aers (Albuterol Sulfate) .... Use Prn 6)  Lipitor 10 Mg Tabs (Atorvastatin Calcium) .Marland Kitchen.. 1 By Mouth At Bedtime 7)  Fish Oil Omega 3 2000mg  .... 1 By Mouth Once Daily  Allergies (verified): 1)  ! Penicillin  Past History:  Past Medical History: 1. Colon cancer - adenoca 1997 s/p rectosigmoid resection. 2. Coronary artery disease - 2 vessel disease by cath in 4/08 in [place]: 70% mid LAD, 90% dist RCA managed medically; LVEF 55%.  ETT-myoview 8/09 - fixed deficit at basal inferior segment (small), low risk 3. PAD - Abnormal ABIs in 9/09 per patient.  He says a further workup was done (he is not sure what) and he was told things were "ok."   4. Diverticulitis, hx of 5. Hyperlipidemia 6. Hypertension 7.  Renal mass (benign- oncocytoma) 4/08: had right nephrectomy.  8. Asthma 9. Low back pain s/p ruptured disc, had spine surgery in 12/10.   MD rooster - ortho spine - cohen cards - Philbert Riser -  Review of Systems  The patient denies anorexia, chest pain, syncope, and abdominal pain.    Physical Exam  General:  alert, well-developed, well-nourished, and cooperative to examination.   Lungs:  normal respiratory effort, no intercostal retractions or use of accessory muscles; normal breath sounds bilaterally - no crackles and no wheezes.    Heart:  normal rate, regular rhythm, no murmur, and no rub. BLE without edema.    Impression & Recommendations:  Problem # 1:  HYPERLIPIDEMIA (ICD-272.4)  His updated medication list for this problem includes:    Lipitor 10 Mg Tabs (Atorvastatin calcium) .Marland Kitchen... 1 by mouth at bedtime  Labs Reviewed: SGOT: 19 (11/28/2009)   SGPT: 22 (11/28/2009)   HDL:36.50 (11/28/2009), 48 (03/08/2009)  LDL:62 (03/08/2009)  Chol:209 (11/28/2009), 126 (03/08/2009)  Trig:98.0 (11/28/2009), 79 (03/08/2009)  Orders: TLB-Lipid Panel (80061-LIPID)  Problem # 2:  HYPERTENSION (ICD-401.9)  His updated medication list for this problem includes:    Enalapril Maleate 20 Mg Tabs (Enalapril maleate) .Marland Kitchen... Take 1 po qd    Diltiazem Hcl Coated Beads 180 Mg Xr24h-cap (Diltiazem hcl coated beads) .Marland Kitchen... Take 1 by mouth qd  BP today: 142/80 Prior BP: 148/78 (01/26/2010)  Labs Reviewed: K+: 4.5 (11/28/2009) Creat: : 1.4 (11/28/2009)   Chol: 209 (11/28/2009)   HDL: 36.50 (11/28/2009)   LDL: 62 (03/08/2009)   TG: 98.0 (11/28/2009)  Problem # 3:  LUMBAGO (ICD-724.2)  His updated medication list for this problem includes:    Bayer Low Strength 81 Mg Tbec (Aspirin) .Marland Kitchen... Take 1 by mouth qd  s/p decompression surg 10/2009 - cohen - improved - cont activity as tol  Problem # 4:  KIDNEY DISEASE (ICD-593.9)  s/p right nephrectomy 5/08 b/c benign mass -  Cr elev but stable over  past 12 mos plan refer for eval by uro  to ensure stability (followed annually in [place] before moving here)  Orders: Urology Referral (Urology)  Problem # 5:  ANEMIA-NOS (ICD-285.9) no evidence of same with last CBC - plan f/u onc/GI given hx colon ca but not urgent at this time  Hgb: 14.2 (10/27/2009)   Hct: 41.3 (10/27/2009)   Platelets: 229.0 (10/27/2009) RBC: 4.15 (10/27/2009)   RDW: 12.9 (10/27/2009)   WBC: 7.2 (10/27/2009) MCV: 99.6 (10/27/2009)   MCHC: 34.3 (10/27/2009) TSH: 1.81 (11/26/2007)  Problem # 6:  CORONARY ARTERY DISEASE (ICD-414.00)  His updated  medication list for this problem includes:    Enalapril Maleate 20 Mg Tabs (Enalapril maleate) .Marland Kitchen... Take 1 po qd    Diltiazem Hcl Coated Beads 180 Mg Xr24h-cap (Diltiazem hcl coated beads) .Marland Kitchen... Take 1 by mouth qd    Bayer Low Strength 81 Mg Tbec (Aspirin) .Marland Kitchen... Take 1 by mouth once daily  per last cards noted 02/2010: Stable with no ischemic symptoms.  Last myoview in 8/09 was low-risk.  Needs aggressive risk factor modification (BP control and lipids control).  Continue ASA, Lipitor, ACEI.  echocardiogram to assess LV systolic function.  - normal LV EF, grade 2 disat dysfx  Labs Reviewed: Chol: 209 (11/28/2009)   HDL: 36.50 (11/28/2009)   LDL: 62 (03/08/2009)   TG: 98.0 (11/28/2009)  Problem # 7:  CHRONIC DIASTOLIC HEART FAILURE (ICD-428.32) echo 02/19/10 reviewed - grade 2 - no edema or decompensation symptoms - cont med mgmt - per cards as needed  His updated medication list for this problem includes:    Enalapril Maleate 20 Mg Tabs (Enalapril maleate) .Marland Kitchen... Take 1 po qd    Bayer Low Strength 81 Mg Tbec (Aspirin) .Marland Kitchen... Take 1 by mouth qd  Complete Medication List: 1)  Enalapril Maleate 20 Mg Tabs (Enalapril maleate) .... Take 1 po qd 2)  Diltiazem Hcl Coated Beads 180 Mg Xr24h-cap (Diltiazem hcl coated beads) .... Take 1 by mouth qd 3)  Bayer Low Strength 81 Mg Tbec (Aspirin) .... Take 1 by mouth qd 4)  Centrum Silver  Tabs (Multiple vitamins-minerals) .... Take 1 by mouth qd 5)  Proair Hfa 108 (90 Base) Mcg/act Aers (Albuterol sulfate) .... Use prn 6)  Lipitor 10 Mg Tabs (Atorvastatin calcium) .Marland Kitchen.. 1 by mouth at bedtime 7)  Fish Oil Omega 3 2000mg   .... 1 by mouth once daily  Other Orders: TLB-Hepatic/Liver Function Pnl (80076-HEPATIC)  Patient Instructions: 1)  it was good to see you today. 2)  test(s) ordered today - your results will be posted on the phone tree for review in 48-72 hours from the time of test completion; call [phone] and enter your 9 digit MRN (listed above on this page, just below your name); if any changes need to be made or there are abnormal results, you will be contacted directly.  3)  we'll make referral to urology. Our office will contact you regarding this appointment once made.  4)  Please schedule a follow-up appointment in 4 months, sooner if problems. will plan referral to oncology as needed at that time Prescriptions: LIPITOR 10 MG TABS (ATORVASTATIN CALCIUM) 1 by mouth at bedtime  #90 x 2   Entered by:   Margaret Pyle, CMA   Authorized by:   Newt Lukes MD   Signed by:   Margaret Pyle, CMA on 03/21/2010   Method used:   Faxed to ...       [facility])             , [place]         Ph: [phone]       Fax: [phone]   RxID:   [id number] LIPITOR 10 MG TABS (ATORVASTATIN CALCIUM) 1 by mouth at bedtime  #90 x 2   Entered by:   Margaret Pyle, CMA   Authorized by:   Newt Lukes MD   Signed by:   Margaret Pyle, CMA on 03/21/2010   Method used:   Printed then faxed to ...       [facility] [employer] [facility])             ,  [place]         Ph: [phone]       Fax: [phone]   RxID:   [id number]

## 2010-12-11 NOTE — Consult Note (Signed)
Summary: [facility]  [facility]   Imported By: [place]ester [place] 01/24/2010 08:24:39  _____________________________________________________________________  External Attachment:    Type:   Image     Comment:   External Document

## 2010-12-13 NOTE — Letter (Signed)
Summary: New Patient letter  [facility]  [address] [place], [place] [address]   Phone: [phone]  Fax: [phone]       11/23/2010 MRN: [id number]  Caleb Gray [address] [place], [place]  [address]  Dear Mr. Caleb Gray,  Welcome to the Gastroenterology Division at [facility].    You are scheduled to see Dr.  Marina Goodell  on 12-10-2010 at 9:15am on the 3rd floor at [facility], [address].  We ask that you try to arrive at our office 15 minutes prior to your appointment time to allow for check-in.  We would like you to complete the enclosed self-administered evaluation form prior to your visit and bring it with you on the day of your appointment.  We will review it with you.  Also, please bring a complete list of all your medications or, if you prefer, bring the medication bottles and we will list them.  Please bring your insurance card so that we may make a copy of it.  If your insurance requires a referral to see a specialist, please bring your referral form from your primary care physician.  Co-payments are due at the time of your visit and may be paid by cash, check or credit card.     Your office visit will consist of a consult with your physician (includes a physical exam), any laboratory testing he/she may order, scheduling of any necessary diagnostic testing (e.g. x-ray, ultrasound, CT-scan), and scheduling of a procedure (e.g. Endoscopy, Colonoscopy) if required.  Please allow enough time on your schedule to allow for any/all of these possibilities.    If you cannot keep your appointment, please call [phone] to cancel or reschedule prior to your appointment date.  This allows [place] the opportunity to schedule an appointment for another patient in need of care.  If you do not cancel or reschedule by 5 p.m. the business day prior to your appointment date, you will be charged a $50.00 late cancellation/no-show fee.    Thank you for choosing   Gastroenterology for your medical needs.  We appreciate the opportunity to care for you.  Please visit [place] at our website  to learn more about our practice.                     Sincerely,                                                             The Gastroenterology Division

## 2010-12-13 NOTE — Assessment & Plan Note (Signed)
Summary: 4 MTH FU--STC   Vital Signs:  Patient profile:   70 year old male Height:      71 inches (180.34 cm) Weight:      197.8 pounds (89.91 kg) O2 Sat:      97 % on Room air Temp:     98.8 degrees F (37.11 degrees C) oral Pulse rate:   81 / minute BP sitting:   130 / 82  (left arm) Cuff size:   large  Vitals Entered By: Orlan Leavens RMA (November 22, 2010 10:36 AM)  O2 Flow:  Room air CC: 4 month follow-up Is Patient Diabetic? No Pain Assessment Patient in pain? no        Primary Care Provider:  Newt Lukes MD  CC:  4 month follow-up.  History of Present Illness: here for followup -  1) low back pain with ruptured disc hx 10/2009 - s/p ortho spine surg 10/2009 -  overall improved - little residual pain, weakness has improved - plays golf without problems, no longer in PT - spasm and tight after prolonged car travel across low back - relieved with motrin  2) HTN -  reports compliance with ongoing medical treatment and no changes in medication dose or frequency. denies adverse side effects related to current therapy.  no CP or edema - "BP always up at the doc's office - white coat"  3) dyslipidemia - resumed lipitor 11/2009, reports 100% med compliance - denies weakness or muscle pain, no adverse side effects - fasting for labs today - ?low hdl concern  4) CAD hx - no angina - reports compliance with ongoing medical treatment and no changes in medication dose or frequency. denies adverse side effects related to current therapy.  s/p review by cards 02/2009 - echo with no wma and norm lv ef  5) hx right renal mass 2008 - benign path s/p total nephrect (attempt at partial unsuccessful due to size) subsquent mild renal insuff by labs (on review) annually checks with uro for same - 04/2010 uro eval showed normal [place] - to reck 2 years  6) colon ca 1998 - s/p resection prev followed annually with onc in [place] - has seen local onc 07/2010 needs est with [facility] for colo  q2-3yr   Clinical Review Panels:  Prevention   Last Colonoscopy:  Done @ [facility]  Hx of colon cancer  Impression: 1. Internal hemorrhoids 2. diverticulosis 3. a few diverticuli colon (08/24/2008)  Lipid Management   Cholesterol:  130 (07/25/2010)   LDL (bad choesterol):  82 (07/25/2010)   HDL (good cholesterol):  33.70 (07/25/2010)   Triglycerides:  79 (03/08/2009)  Diabetes Management   HgBA1C:  5.9 (03/08/2009)   Creatinine:  1.4 (11/28/2009)  CBC   WBC:  7.2 (10/27/2009)   RBC:  4.15 (10/27/2009)   Hgb:  14.2 (10/27/2009)   Hct:  41.3 (10/27/2009)   Platelets:  229.0 (10/27/2009)   MCV  99.6 (10/27/2009)   MCHC  34.3 (10/27/2009)   RDW  12.9 (10/27/2009)   PMN:  58.2 (10/27/2009)   Lymphs:  24.0 (10/27/2009)   Monos:  11.8 (10/27/2009)   Eosinophils:  4.9 (10/27/2009)   Basophil:  1.1 (10/27/2009)  Complete Metabolic Panel   Glucose:  96 (11/28/2009)   Sodium:  142 (11/28/2009)   Potassium:  4.5 (11/28/2009)   Chloride:  109 (11/28/2009)   CO2:  25 (11/28/2009)   BUN:  19 (11/28/2009)   Creatinine:  1.4 (11/28/2009)  Albumin:  3.8 (07/25/2010)   Total Protein:  6.3 (07/25/2010)   Calcium:  9.3 (11/28/2009)   Total Bili:  0.6 (07/25/2010)   Alk Phos:  73 (07/25/2010)   SGPT (ALT):  29 (07/25/2010)   SGOT (AST):  21 (07/25/2010)   Current Medications (verified): 1)  Enalapril Maleate 20 Mg Tabs (Enalapril Maleate) .... Take 1 Po Qd 2)  Diltiazem Hcl Coated Beads 180 Mg Xr24h-Cap (Diltiazem Hcl Coated Beads) .... Take 1 By Mouth Qd 3)  Bayer Low Strength 81 Mg Tbec (Aspirin) .... Take 1 By Mouth Qd 4)  Centrum Silver  Tabs (Multiple Vitamins-Minerals) .... Take 1 By Mouth Qd 5)  Proair Hfa 108 (90 Base) Mcg/act Aers (Albuterol Sulfate) .... Inhale 2 Puff Four Times A Day As Needed 6)  Lipitor 10 Mg Tabs (Atorvastatin Calcium) .Marland Kitchen.. 1 By Mouth At Bedtime 7)  Fish Oil Omega 3 2000mg  .... 1 By Mouth Once Daily  Allergies  (verified): 1)  ! Penicillin  Past History:  Past Medical History: 1. Colon cancer - adenoca 1997 s/p rectosigmoid resection. 2. Coronary artery disease - 2 vessel disease by cath in 4/08 in [place]: 70% mid LAD, 90% dist RCA managed medically; LVEF 55%.  ETT-myoview 8/09 - fixed deficit at basal inferior segment (small), low risk.  Echo (4/11): EF 55-60%, no regional wall motion abnormalities, moderate diastolic dysfunction, normal RV and normal PA pressure.  3. PAD - Abnormal ABIs in 9/09 per patient.  He says a further workup was done (he is not sure what) and he was told things were "ok"   4. Diverticulitis, hx of 5. Hyperlipidemia 6. Hypertension 7. 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  8. Asthma 9. Low back pain s/p ruptured disc, -spine surgery in 12/10.   MD roster - ortho spine - cohen cards - Philbert Riser - [facility]renn onc - shadad  Review of Systems  The patient denies fever, weight loss, chest pain, dyspnea on exertion, peripheral edema, headaches, and difficulty walking.    Physical Exam  General:  alert, well-developed, well-nourished, and cooperative to examination.   Lungs:  normal respiratory effort, no intercostal retractions or use of accessory muscles; normal breath sounds bilaterally - no crackles and no wheezes.    Heart:  normal rate, regular rhythm, no murmur, and no rub. BLE without edema.  Msk:  back: full range of motion of lumbar spine. Min tender to palpation around lower paraspinal region due to myofascial spasm. Able to walk without difficulty and ambulates with a normal unaided gait.    Impression & Recommendations:  Problem # 1:  HYPERLIPIDEMIA (ICD-272.4)  His updated medication list for this problem includes:    Lipitor 10 Mg Tabs (Atorvastatin calcium) .Marland Kitchen... 1 by mouth at bedtime  Orders: TLB-Lipid Panel (80061-LIPID)  LDL a little higher and HDL a little lower in 9/11 than in 5/11.  Goal LDL < 70 given CAD.  He needs to  get back into regular exercise and watch his diet.  recheck now and consider inc lipitor dose  Labs Reviewed: SGOT: 21 (07/25/2010)   SGPT: 29 (07/25/2010)   HDL:33.70 (07/25/2010), 41.20 (03/21/2010)  LDL:82 (07/25/2010), 76 (03/21/2010)  Chol:130 (07/25/2010), 135 (03/21/2010)  Trig:70.0 (07/25/2010), 90.0 (03/21/2010)  Problem # 2:  LUMBAGO (ICD-724.2)  His updated medication list for this problem includes:    Bayer Low Strength 81 Mg Tbec (Aspirin) .Marland Kitchen... Take 1 by mouth qd    Tylenol Arthritis Pain 650 Mg Cr-tabs (Acetaminophen) .Marland Kitchen... 1 by mouth  two times a day    Robaxin 500 Mg Tabs (Methocarbamol) .Marland Kitchen... 1 by mouth at bedtime as needed spasm  s/p decompression surg 10/2009 - cohen - improved - cont activity as tol rec tyelnol for ddd component and robaxin for spasm pain as needed - cont back exercises as per PT education post op  Orders: Prescription Created Electronically [phone])  Problem # 3:  HYPERTENSION (ICD-401.9)  His updated medication list for this problem includes:    Enalapril Maleate 20 Mg Tabs (Enalapril maleate) .Marland Kitchen... Take 1 po qd    Diltiazem Hcl Coated Beads 180 Mg Xr24h-cap (Diltiazem hcl coated beads) .Marland Kitchen... Take 1 by mouth qd  BP today: 130/82 Prior BP: 134/74 (08/09/2010)  Labs Reviewed: K+: 4.5 (11/28/2009) Creat: : 1.4 (11/28/2009)   Chol: 130 (07/25/2010)   HDL: 33.70 (07/25/2010)   LDL: 82 (07/25/2010)   TG: 70.0 (07/25/2010)  Problem # 4:  PERSONAL HISTORY MALIG NEOPLASM LARGE INTESTINE (ICD-V10.05)  refr to GI as per onc to arrange for next screening colo when due f/u onc q6-81mo as planned  Orders: Gastroenterology Referral (GI)  Complete Medication List: 1)  Enalapril Maleate 20 Mg Tabs (Enalapril maleate) .... Take 1 po qd 2)  Diltiazem Hcl Coated Beads 180 Mg Xr24h-cap (Diltiazem hcl coated beads) .... Take 1 by mouth qd 3)  Bayer Low Strength 81 Mg Tbec (Aspirin) .... Take 1 by mouth qd 4)  Centrum Silver Tabs (Multiple  vitamins-minerals) .... Take 1 by mouth qd 5)  Proair Hfa 108 (90 Base) Mcg/act Aers (Albuterol sulfate) .... Inhale 2 puff four times a day as needed 6)  Lipitor 10 Mg Tabs (Atorvastatin calcium) .Marland Kitchen.. 1 by mouth at bedtime 7)  Fish Oil Omega 3 2000mg   .... 1 by mouth once daily 8)  Tylenol Arthritis Pain 650 Mg Cr-tabs (Acetaminophen) .Marland Kitchen.. 1 by mouth two times a day 9)  Robaxin 500 Mg Tabs (Methocarbamol) .Marland Kitchen.. 1 by mouth at bedtime as needed spasm  Other Orders: TLB-Hepatic/Liver Function Pnl (80076-HEPATIC)  Patient Instructions: 1)  it was good to see you today. 2)  test(s) ordered today - your results will be posted on the phone tree for review in 48-72 hours from the time of test completion; call [phone] and enter your 9 digit MRN (listed above on this page, just below your name); if any changes need to be made or there are abnormal results, you will be contacted directly.  3)  we'll make referral to [facility]. Our office will contact you regarding this appointment once made.  4)  start tylenol arthritis two times a day and use robaxin for muscle spasm qhsprn - your prescription has been electronically submitted to your pharmacy. Please take as directed. Contact our office if you believe you're having problems with the medication(s).  5)  Please schedule a follow-up appointment in 4-6 months, call sooner if problems.  Prescriptions: ROBAXIN 500 MG TABS (METHOCARBAMOL) 1 by mouth at bedtime as needed spasm  #30 x 1   Entered and Authorized by:   Newt Lukes MD   Signed by:   Newt Lukes MD on 11/22/2010   Method used:   Electronically to        [facility]  [place] (retail)       [address]       [place], [place]  [address]       Ph: [phone] or [phone]       Fax: [phone]   RxID:   [id number]  Orders Added: 1)  TLB-Lipid Panel [80061-LIPID] 2)  TLB-Hepatic/Liver Function Pnl [80076-HEPATIC] 3)  Est. Patient Level IV [46962] 4)   Prescription Created Electronically [[id number]] 5)  Gastroenterology Referral [GI]

## 2010-12-19 NOTE — Assessment & Plan Note (Signed)
Summary: HX OF COLON CA 1997 (to establish)   History of Present Illness Visit Type: Initial Consult Primary GI MD: Yancey Flemings MD Primary Provider: Rene Paci, MD Requesting Provider: Dr Rene Paci Chief Complaint: Patient here to discuss having a colonoscopy due to his previous hx of colon cancer in 1998. He denies any current GI problems. History of Present Illness:   70 year oldwhite male with hypertension, hyperlipidemia, coronary artery disease, asthma, and remote colon cancer. He presents today regarding surveillance colonoscopy. Patient has a history of stage III colon cancer (T3, N1) diagnosed in 1997. He is status post rectosigmoid resection followed by adjuvant chemotherapy. He tells me that he has had surveillance colonoscopies about every 2-1/2 years since. Initial followup exams revealed small benign polyps with more recent examinations being polyp free. I have reviewed his last colonoscopy report by Dr. Karl Bales in [place]. The examination was complete with a good preparation. Evidence of sigmoid resection noted. Diverticular disease and internal hemorrhoids present. No polyps. Patient's GI review of systems is unremarkable. Laboratories have been unremarkable. He has established with multiple local physicians upon his relocation to this area, including Dr. Clelia Croft of oncology.   GI Review of Systems      Denies abdominal pain, acid reflux, belching, bloating, chest pain, dysphagia with liquids, dysphagia with solids, heartburn, loss of appetite, nausea, vomiting, vomiting blood, weight loss, and  weight gain.      Reports diverticulosis and  hemorrhoids.     Denies anal fissure, black tarry stools, change in bowel habit, constipation, diarrhea, fecal incontinence, heme positive stool, irritable bowel syndrome, jaundice, light color stool, liver problems, rectal bleeding, and  rectal pain. Preventive Screening-Counseling & Management  Alcohol-Tobacco  Smoking Status: quit      Drug Use:  no.      Current Medications (verified): 1)  Enalapril Maleate 20 Mg Tabs (Enalapril Maleate) .... Take 1 Po Qd 2)  Diltiazem Hcl Coated Beads 180 Mg Xr24h-Cap (Diltiazem Hcl Coated Beads) .... Take 1 By Mouth Qd 3)  Bayer Low Strength 81 Mg Tbec (Aspirin) .... Take 1 By Mouth Qd 4)  Centrum Silver  Tabs (Multiple Vitamins-Minerals) .... Take 1 By Mouth Qd 5)  Proair Hfa 108 (90 Base) Mcg/act Aers (Albuterol Sulfate) .... Inhale 2 Puff Four Times A Day As Needed 6)  Lipitor 20 Mg Tabs (Atorvastatin Calcium) .Marland Kitchen.. 1 By Mouth At Bedtime 7)  Fish Oil Omega 3 2000mg  .... 1 By Mouth Once Daily 8)  Tylenol Arthritis Pain 650 Mg Cr-Tabs (Acetaminophen) .Marland Kitchen.. 1 By Mouth Two Times A Day  Allergies (verified): 1)  ! Penicillin  Past History:  Past Medical History: Reviewed history from 11/22/2010 and no changes required. 1. Colon cancer - adenoca 1997 s/p rectosigmoid resection. 2. Coronary artery disease - 2 vessel disease by cath in 4/08 in [place]: 70% mid LAD, 90% dist RCA managed medically; LVEF 55%.  ETT-myoview 8/09 - fixed deficit at basal inferior segment (small), low risk.  Echo (4/11): EF 55-60%, no regional wall motion abnormalities, moderate diastolic dysfunction, normal RV and normal PA pressure.  3. PAD - Abnormal ABIs in 9/09 per patient.  He says a further workup was done (he is not sure what) and he was told things were "ok"   4. Diverticulitis, hx of 5. Hyperlipidemia 6. Hypertension 7. 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  8. Asthma 9. Low back pain s/p ruptured disc, -spine surgery in 12/10.   MD roster - ortho spine - cohen  cards - Shirlee Latch uro - [facility] onc - shadad  Past Surgical History: right kidney removed (03/2007) rectosigmoidectomy (1997) lumbar decompression/laminectomy (10/2009 - cohen)  Family History: Family History of Alcoholism/Addiction (parent) Family History of Colon CA 1st degree relative  <60 (parent) Heart disease (parent) dad expired age 53 - MI mom expired age 56 - colon ca Family History of Diabetes: Mother  Social History: occ social alcohol use-2 drinks on weekends married, lives with wife, retired 2007 moved from [place] to [place] 04/2009 Patient is a former smoker. Stopped at age 69 (32s) Daily Caffeine Use-2 cups daily Illicit Drug Use - no Smoking Status:  quit Drug Use:  no  Review of Systems       The patient complains of allergy/sinus and back pain.  The patient denies anemia, anxiety-new, arthritis/joint pain, blood in urine, breast changes/lumps, change in vision, confusion, cough, coughing up blood, depression-new, fainting, fatigue, fever, headaches-new, hearing problems, heart murmur, heart rhythm changes, itching, menstrual pain, muscle pains/cramps, night sweats, nosebleeds, pregnancy symptoms, shortness of breath, skin rash, sleeping problems, sore throat, swelling of feet/legs, swollen lymph glands, thirst - excessive , urination - excessive , urination changes/pain, urine leakage, vision changes, and voice change.    Vital Signs:  Patient profile:   70 year old male Height:      71 inches Weight:      200.25 pounds BMI:     28.03 BSA:     2.11 Pulse rate:   84 / minute Pulse rhythm:   regular BP sitting:   138 / 80  (left arm)  Vitals Entered By: Lamona Curl CMA Duncan Dull) (December 10, 2010 9:01 AM)  Physical Exam  General:  Well developed, well nourished, no acute distress. Head:  Normocephalic and atraumatic. Eyes:  PERRLA, no icterus. Mouth:  No deformity or lesions. Neck:  Supple; no masses or thyromegaly. Lungs:  Clear throughout to auscultation. Heart:  Regular rate and rhythm; no murmurs, rubs,  or bruits. Abdomen:  Soft, nontender and nondistended. No masses, hepatosplenomegaly or hernias noted. Normal bowel sounds. Pulses:  Normal pulses noted. Neurologic:  Alert and  oriented x4. Skin:  Intact without  significant lesions or rashes. Psych:  Alert and cooperative. Normal mood and affect.   Impression & Recommendations:  Problem # 1:  PERSONAL HISTORY MALIG NEOPLASM LARGE INTESTINE (ICD-V10.05) remote history of colon cancer as described. Multiple surveillance colonoscopies with the most recent examination in October of 2009 being negative for neoplasia. Currently asymptomatic. I have reviewed with him the current guidelines for colon cancer surveillance in patients with a history of colon cancer. Based on these, his followup interval would be 5 years from his last colonoscopy, or October 2014. He understands and is appreciative. A recall has been placed in the computer for this date  Problem # 2:  DIVERTICULOSIS-COLON (ICD-562.10) asymptomatic  Patient Instructions: 1)  Recall colonoscopy will be September 2014.  You will receive a letter to let you know when it is time to schedule. 2)  Please schedule a follow-up appointment as needed.  3)  Copy sent to : Rene Paci, MD, Eli Hose, MD 4)  The medication list was reviewed and reconciled.  All changed / newly prescribed medications were explained.  A complete medication list was provided to the patient / caregiver.

## 2010-12-27 NOTE — Progress Notes (Signed)
Summary: PA-Methocarbarmol  Phone Note From Pharmacy   Summary of Call: PA-Methocarbamol called [facility] @ [phone], awaiting form. Dagoberto Reef  November 27, 2010 4:40 PM Form received and given to Dr Felicity Coyer to complete. Dagoberto Reef  November 27, 2010 4:48 PM Faxed to [facility] @ [phone], Malva Cogan approval. Dagoberto Reef  November 28, 2010 4:13 PM Medication Removed on 12/10/10 . Initial call taken by: Dagoberto Reef,  December 18, 2010 4:41 PM

## 2011-01-15 ENCOUNTER — Encounter: Payer: Self-pay | Admitting: Internal Medicine

## 2011-01-15 ENCOUNTER — Telehealth: Payer: Self-pay | Admitting: Internal Medicine

## 2011-01-16 ENCOUNTER — Other Ambulatory Visit (HOSPITAL_COMMUNITY): Payer: Self-pay | Admitting: Orthopaedic Surgery

## 2011-01-16 DIAGNOSIS — M549 Dorsalgia, unspecified: Secondary | ICD-10-CM

## 2011-01-22 NOTE — Progress Notes (Signed)
Summary: Rx?  Phone Note Call from Patient Call back at [facility] Phone [phone]   Caller: Patient Summary of Call: Pt called stating Dr Noel Gerold Rxd Celebrex but pt is requesting VAL's advisement on if this medication is okay to take with other prescription meds.  Initial call taken by: Margaret Pyle, CMA,  January 15, 2011 1:34 PM  Follow-up for Phone Call        added med to list - yes ok to take Follow-up by: Newt Lukes MD,  January 16, 2011 8:20 AM  Additional Follow-up for Phone Call Additional follow up Details #1::        no answer, no VM.Margaret Pyle, CMA  January 16, 2011 8:29 AM   Pt informed Additional Follow-up by: Margaret Pyle, CMA,  January 16, 2011 10:05 AM    New/Updated Medications: CELEBREX 200 MG CAPS (CELECOXIB) 1 by mouth once daily (or as directed)

## 2011-01-24 ENCOUNTER — Ambulatory Visit (HOSPITAL_COMMUNITY)
Admission: RE | Admit: 2011-01-24 | Discharge: 2011-01-24 | Disposition: A | Discharge status: Home / Self Care | Payer: Medicare Other | Source: Ambulatory Visit | Attending: Orthopaedic Surgery | Admitting: Orthopaedic Surgery

## 2011-01-24 DIAGNOSIS — M549 Dorsalgia, unspecified: Secondary | ICD-10-CM

## 2011-01-24 DIAGNOSIS — M5126 Other intervertebral disc displacement, lumbar region: Secondary | ICD-10-CM | POA: Insufficient documentation

## 2011-01-29 NOTE — Consult Note (Signed)
Summary: [facility]  [facility]   Imported By: Sherian Rein 01/25/2011 12:30:50  _____________________________________________________________________  External Attachment:    Type:   Image     Comment:   External Document

## 2011-02-12 LAB — CREATININE, SERUM
Creatinine, Ser: 1.41 mg/dL (ref 0.4–1.5)
GFR calc non Af Amer: 50 mL/min — ABNORMAL LOW (ref >60)

## 2011-02-28 ENCOUNTER — Other Ambulatory Visit: Payer: Self-pay | Admitting: Oncology

## 2011-02-28 ENCOUNTER — Encounter (HOSPITAL_BASED_OUTPATIENT_CLINIC_OR_DEPARTMENT_OTHER): Payer: Medicare Other | Admitting: Oncology

## 2011-02-28 DIAGNOSIS — C189 Malignant neoplasm of colon, unspecified: Secondary | ICD-10-CM

## 2011-02-28 DIAGNOSIS — Z905 Acquired absence of kidney: Secondary | ICD-10-CM

## 2011-02-28 DIAGNOSIS — C649 Malignant neoplasm of unspecified kidney, except renal pelvis: Secondary | ICD-10-CM

## 2011-02-28 DIAGNOSIS — Z9049 Acquired absence of other specified parts of digestive tract: Secondary | ICD-10-CM

## 2011-02-28 LAB — COMPREHENSIVE METABOLIC PANEL
ALT: 33 U/L (ref 0–53)
AST: 24 U/L (ref 0–37)
Alkaline Phosphatase: 77 U/L (ref 39–117)
Creatinine, Ser: 1.43 mg/dL (ref 0.40–1.50)
Sodium: 138 mEq/L (ref 135–145)
Total Bilirubin: 0.8 mg/dL (ref 0.3–1.2)
Total Protein: 6.8 g/dL (ref 6.0–8.3)

## 2011-02-28 LAB — CBC WITH DIFFERENTIAL/PLATELET
BASO%: 0.9 % (ref 0.0–2.0)
Basophils Absolute: 0.1 10*3/uL (ref 0.0–0.1)
EOS%: 2.7 % (ref 0.0–7.0)
HGB: 14.9 g/dL (ref 13.0–17.1)
MCH: 33 pg (ref 27.2–33.4)
RBC: 4.51 10*6/uL (ref 4.20–5.82)
RDW: 13.2 % (ref 11.0–14.6)
lymph#: 1.7 10*3/uL (ref 0.9–3.3)

## 2011-03-26 ENCOUNTER — Encounter: Payer: Self-pay | Admitting: Internal Medicine

## 2011-04-25 ENCOUNTER — Ambulatory Visit (INDEPENDENT_AMBULATORY_CARE_PROVIDER_SITE_OTHER): Payer: Medicare Other | Admitting: Internal Medicine

## 2011-04-25 ENCOUNTER — Other Ambulatory Visit (INDEPENDENT_AMBULATORY_CARE_PROVIDER_SITE_OTHER): Payer: Medicare Other

## 2011-04-25 ENCOUNTER — Encounter: Payer: Self-pay | Admitting: Internal Medicine

## 2011-04-25 VITALS — BP 134/80 | HR 72 | Temp 98.6°F | Ht 71.0 in | Wt 198.0 lb

## 2011-04-25 DIAGNOSIS — M545 Low back pain, unspecified: Secondary | ICD-10-CM

## 2011-04-25 DIAGNOSIS — E785 Hyperlipidemia, unspecified: Secondary | ICD-10-CM

## 2011-04-25 DIAGNOSIS — I10 Essential (primary) hypertension: Secondary | ICD-10-CM

## 2011-04-25 LAB — LIPID PANEL
HDL: 43.7 mg/dL (ref >39.00)
Total CHOL/HDL Ratio: 3
VLDL: 13.4 mg/dL (ref 0.0–40.0)

## 2011-04-25 MED ORDER — ALBUTEROL SULFATE HFA 108 (90 BASE) MCG/ACT IN AERS: 2.0000 | INHALATION_SPRAY | Freq: Four times a day (QID) | RESPIRATORY_TRACT | Status: DC | PRN

## 2011-04-25 NOTE — Assessment & Plan Note (Signed)
S/p L4-5 laminectomy/decompression 10/2009 - residual back issues but initial leg pain resolved Ongoing evl/tx by PMR and PT ([facility]) - continue same Alternate tylenol and NSAIDs as ongoing for pain relief as needed

## 2011-04-25 NOTE — Patient Instructions (Signed)
It was good to see you today. We have reviewed your prior records including labs and tests today Test(s) ordered today. Your results will be called to you after review (48-72hours after test completion). If any changes need to be made, you will be notified at that time. Medications reviewed, no changes at this time. Refill on medication(s) as discussed today. Please schedule followup in 6 months, call sooner if problems.  

## 2011-04-25 NOTE — Assessment & Plan Note (Signed)
The current medical regimen is effective;  continue present plan and medications. BP Readings from Last 3 Encounters:  04/25/11 134/80  12/10/10 138/80  11/22/10 130/82

## 2011-04-25 NOTE — Assessment & Plan Note (Signed)
Lipitor increase 11/2010 from 10qd to 20qd to get LDL<70 Recheck now - adjust prn 

## 2011-04-25 NOTE — Progress Notes (Signed)
Subjective:    Patient ID: Caleb Gray, male    DOB: [birth year], 70 y.o.   MRN: [id number]  HPI Here for follow up - reviewed chronic medical issues today:  low back pain with ruptured disc hx 10/2009 - s/p decompression/laminectomy 10/2009 -  overall improved - little residual pain, weakness has improved - plays golf without problems  ongoing PT for mild daily discomfort-  spasm and tight after prolonged car travel (or walking) across low back  - relieved with motrin  Also ongoing ESI with Dr. Retia Passe as needed  HTN - reports compliance with ongoing medical treatment and no changes in medication dose or frequency. denies adverse side effects related to current therapy. no CP or edema - "BP always up at the doc's office - white coat"   dyslipidemia - resumed lipitor 11/2009, increased dose 02/2011 as LDL>70 - reports 100% med compliance - denies weakness or muscle pain, no adverse side effects -   CAD hx - no angina - reports compliance with ongoing medical treatment and no changes in medication dose or frequency. denies adverse side effects related to current therapy. Follows with cards annually- 02/2009 echo with no wma and norm lv ef   hx right renal mass 2008 - benign path oncocytoma s/p total nephrect (attempt at partial unsuccessful due to size)  subsquent mild renal insuff by labs (on review)  annually checks with uro for same - last uro eval showed normal [place] - to reck 2 years (2013)  colon ca 1998 - s/p resection  prev followed annually with onc in [place] - est with local onc 07/2010  s/p est with [facility] 11/2010 - recommends 5year follow up 08/2013 (sooner if symptoms)  Past Medical History  Diagnosis Date  . CORONARY ARTERY DISEASE   . ANEMIA-NOS   . ASTHMA   . Colon cancer     Adenoca 1997 stage III T3, N1 s/p rectosigmoid resection  . HYPERLIPIDEMIA   . HYPERTENSION   . Chronic diastolic heart failure   . UNSPECIFIED PERIPHERAL VASCULAR DISEASE   . Low back pain 10/2009   s/p laminectomy/decompression L4-5  . Right kidney mass 02/2007    benign (oncocytoma) s/p resection/total nephrectomy     Review of Systems  Respiratory: Negative for cough.   Cardiovascular: Negative for chest pain.  Neurological: Negative for weakness and headaches.       Objective:   Physical Exam BP 134/80  Pulse 72  Temp(Src) 98.6 F (37 C) (Oral)  Ht 5\' 11"  (1.803 m)  Wt 198 lb (89.812 kg)  BMI 27.62 kg/m2  SpO2 98% Wt Readings from Last 3 Encounters:  04/25/11 198 lb (89.812 kg)  12/10/10 200 lb 4 oz (90.833 kg)  11/22/10 197 lb 12.8 oz (89.721 kg)   Physical Exam  Constitutional:  oriented to person, place, and time. appears well-developed and well-nourished. No distress.  Neck: Normal range of motion. Neck supple. No JVD present. No thyromegaly present.  Cardiovascular: Normal rate, regular rhythm and normal heart sounds.  No murmur heard. no BLE edema Pulmonary/Chest: Effort normal and breath sounds normal. No respiratory distress. no wheezes.  Neurological: he is alert and oriented to person, place, and time. No cranial nerve deficit. Coordination normal.  Psychiatric: he has a normal mood and affect. behavior is normal. Judgment and thought content normal.   Lab Results  Component Value Date   WBC 7.2 10/27/2009   HGB 14.9 02/28/2011   HCT 43.4 02/28/2011   PLT 230 02/28/2011  CHOL 138 11/22/2010   TRIG 84.0 11/22/2010   HDL 39.70 11/22/2010   LDLDIRECT 159.7 11/28/2009   ALT 33 02/28/2011   ALT 33 02/28/2011   AST 24 02/28/2011   AST 24 02/28/2011   NA 138 02/28/2011   NA 138 02/28/2011   K 4.4 02/28/2011   K 4.4 02/28/2011   CL 102 02/28/2011   CL 102 02/28/2011   CREATININE 1.43 02/28/2011   CREATININE 1.43 02/28/2011   BUN 17 02/28/2011   BUN 17 02/28/2011   CO2 26 02/28/2011   CO2 26 02/28/2011   TSH 1.81 11/26/2007   INR 1.0 ratio 10/27/2009   HGBA1C 5.9 03/08/2009       Assessment & Plan:  See problem list. Medications and labs reviewed today. Time  spent with pt today 25 minutes, greater than 50% time spent counseling patient on back pain, hypertension, lipids and medication review. Also review of prior records

## 2011-07-05 ENCOUNTER — Other Ambulatory Visit: Payer: Self-pay

## 2011-07-05 MED ORDER — DILTIAZEM HCL ER COATED BEADS 180 MG PO CP24: 180.0000 mg | ORAL_CAPSULE | Freq: Every day | ORAL | Status: DC

## 2011-07-30 ENCOUNTER — Other Ambulatory Visit: Payer: Self-pay | Admitting: *Deleted

## 2011-07-30 MED ORDER — ENALAPRIL MALEATE 20 MG PO TABS: 20.0000 mg | ORAL_TABLET | Freq: Every day | ORAL | Status: DC

## 2011-07-30 MED ORDER — ATORVASTATIN CALCIUM 20 MG PO TABS: 20.0000 mg | ORAL_TABLET | Freq: Every day | ORAL | Status: DC

## 2011-07-30 NOTE — Telephone Encounter (Signed)
[facility].Marland KitchenMarland Kitchen9/18/12@1 :30pm/LMB

## 2011-09-06 ENCOUNTER — Other Ambulatory Visit: Payer: Self-pay | Admitting: Oncology

## 2011-09-06 ENCOUNTER — Encounter (HOSPITAL_BASED_OUTPATIENT_CLINIC_OR_DEPARTMENT_OTHER): Payer: Medicare Other | Admitting: Oncology

## 2011-09-06 DIAGNOSIS — C649 Malignant neoplasm of unspecified kidney, except renal pelvis: Secondary | ICD-10-CM

## 2011-09-06 DIAGNOSIS — C189 Malignant neoplasm of colon, unspecified: Secondary | ICD-10-CM

## 2011-09-06 DIAGNOSIS — Z9049 Acquired absence of other specified parts of digestive tract: Secondary | ICD-10-CM

## 2011-09-06 DIAGNOSIS — Z905 Acquired absence of kidney: Secondary | ICD-10-CM

## 2011-09-06 LAB — CEA: CEA: <0.5 ng/mL (ref 0.0–5.0)

## 2011-09-06 LAB — CBC WITH DIFFERENTIAL/PLATELET
BASO%: 0.3 % (ref 0.0–2.0)
HCT: 40.6 % (ref 38.4–49.9)
LYMPH%: 20.9 % (ref 14.0–49.0)
MCHC: 34.5 g/dL (ref 32.0–36.0)
MCV: 98.8 fL — ABNORMAL HIGH (ref 79.3–98.0)
MONO#: 0.7 10*3/uL (ref 0.1–0.9)
MONO%: 9 % (ref 0.0–14.0)
NEUT%: 63.4 % (ref 39.0–75.0)
Platelets: 230 10*3/uL (ref 140–400)
RBC: 4.11 10*6/uL — ABNORMAL LOW (ref 4.20–5.82)
WBC: 7.9 10*3/uL (ref 4.0–10.3)

## 2011-09-06 LAB — COMPREHENSIVE METABOLIC PANEL
ALT: 32 U/L (ref 0–53)
Alkaline Phosphatase: 65 U/L (ref 39–117)
CO2: 24 mEq/L (ref 19–32)
Creatinine, Ser: 1.34 mg/dL (ref 0.50–1.35)
Glucose, Bld: 103 mg/dL — ABNORMAL HIGH (ref 70–99)
Total Bilirubin: 0.6 mg/dL (ref 0.3–1.2)

## 2011-09-17 ENCOUNTER — Encounter: Payer: Self-pay | Admitting: Cardiology

## 2011-09-17 ENCOUNTER — Ambulatory Visit (INDEPENDENT_AMBULATORY_CARE_PROVIDER_SITE_OTHER): Payer: Medicare Other | Admitting: Cardiology

## 2011-09-17 VITALS — BP 166/78 | HR 76 | Ht 71.0 in | Wt 202.0 lb

## 2011-09-17 DIAGNOSIS — I10 Essential (primary) hypertension: Secondary | ICD-10-CM

## 2011-09-17 DIAGNOSIS — E785 Hyperlipidemia, unspecified: Secondary | ICD-10-CM

## 2011-09-17 DIAGNOSIS — I2510 Atherosclerotic heart disease of native coronary artery without angina pectoris: Secondary | ICD-10-CM

## 2011-09-17 NOTE — Patient Instructions (Signed)
Take and record your blood pressure. Call me in 2 weeks to give me the readings. Luana Shu [phone]  Your physician wants you to follow-up in: 1 year with Dr Shirlee Latch. (November 2013). You will receive a reminder letter in the mail two months in advance. If you don't receive a letter, please call our office to schedule the follow-up appointment.

## 2011-09-18 NOTE — Assessment & Plan Note (Signed)
LDL was near goal (< 70) when last checked.

## 2011-09-18 NOTE — Progress Notes (Signed)
PCP: Dr. Felicity Coyer  70 yo with history of CAD, colon cancer, HTN, and right nephrectomy presents for cardiology followup. Patient had pre-op testing before nephrectomy in 2008. Stress test was suggestive of coronary disease. He had never had chest pain or exertional dyspnea. Cardiac cath was done showing 70% mid LAD and 90% distal RCA. Given lack of symptoms, he was managed medically. Since that time, he has had no cardiac problems. He had a low risk myoview in 2009. He still has never had chest pain. He walks for 2 miles on occasion for exercise and golfs with no exertional dyspnea. Echo in 4/11 showed preserved LV systolic function and moderate diastolic dysfunction.  No claudication.  BP is high today at 166/78.   Labs (1/11): creatinine 1.4, K 4.5, HDL 37, LDL 160 (off Lipitor)  Labs (5/11): LDL 76, HDL 41  Labs (9/11): LDL 82, HDL 33.7  Labs (6/12): LDL 72, HDL 44 Labs (10/12): K 4.2, creatinine 1.34  ECG: NSR, old inferior MI  Allergies (verified):  1) ! Penicillin   Past Medical History:  1. Colon cancer - adenoca 1997 s/p rectosigmoid resection.  2. Coronary artery disease - 2 vessel disease by cath in 4/08 in [place]: 70% mid LAD, 90% dist RCA managed medically; LVEF 55%. ETT-myoview 8/09 - fixed deficit at basal inferior segment (small), low risk. Echo (4/11): EF 55-60%, no regional wall motion abnormalities, moderate diastolic dysfunction, normal RV and normal PA pressure.  3. PAD - Abnormal ABIs in 9/09 per patient. He says a further workup was done (he is not sure what) and he was told things were "ok"  4. Diverticulitis, hx of  5. Hyperlipidemia  6. Hypertension  7. 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  8. Asthma  9. Low back pain s/p ruptured disc, had spine surgery in 12/10.   Current Outpatient Prescriptions  Medication Sig Dispense Refill  . acetaminophen (TYLENOL) 650 MG CR tablet Take 650 mg by mouth as needed.       Marland Kitchen albuterol (PROAIR HFA)  108 (90 BASE) MCG/ACT inhaler Inhale 2 puffs into the lungs 4 (four) times daily as needed.  18 g  3  . aspirin 81 MG tablet Take 81 mg by mouth daily.        Marland Kitchen atorvastatin (LIPITOR) 20 MG tablet Take 1 tablet (20 mg total) by mouth at bedtime.  90 tablet  3  . diltiazem (CARDIZEM CD) 180 MG 24 hr capsule Take 1 capsule (180 mg total) by mouth daily.  90 capsule  3  . enalapril (VASOTEC) 20 MG tablet Take 1 tablet (20 mg total) by mouth daily.  90 tablet  3  . ibuprofen (ADVIL,MOTRIN) 800 MG tablet Take 800 mg by mouth as needed.        . Multiple Vitamins-Minerals (CENTRUM SILVER) tablet Take 1 tablet by mouth daily.        . Omega-3 Fatty Acids (FISH OIL) 1000 MG CAPS Take by mouth daily.          BP 166/78  Pulse 76  Ht 5\' 11"  (1.803 m)  Wt 91.627 kg (202 lb)  BMI 28.17 kg/m2 General: NAD Neck: No JVD, no thyromegaly or thyroid nodule.  Lungs: Clear to auscultation bilaterally with normal respiratory effort. CV: Nondisplaced PMI.  Heart regular S1/S2, no S3/S4, no murmur.  No peripheral edema.  No carotid bruit.  Normal pedal pulses.  Abdomen: Soft, nontender, no hepatosplenomegaly, no distention.  Neurologic: Alert and oriented x 3.  Psych: Normal affect. Extremities: No clubbing or cyanosis.

## 2011-09-18 NOTE — Assessment & Plan Note (Signed)
Stable with no ischemic symptoms.  His ECG this year shows inferior Qs.  It is possible that his distal RCA occluded (90% stenosis on prior cath) but he has had no symptoms consistent with this.  Continue ASA, statin, enalapril.

## 2011-09-18 NOTE — Assessment & Plan Note (Signed)
BP is high today but tends to run better at home.  He will check his BP daily for 2 weeks and call us with the numbers to see if his antihypertensive regimen needs to be adjusted (would probably change diltiazem to amlodipine).

## 2011-10-08 ENCOUNTER — Telehealth: Payer: Self-pay | Admitting: Cardiology

## 2011-10-08 NOTE — Telephone Encounter (Signed)
Most of these are ok.  No changes.

## 2011-10-08 NOTE — Telephone Encounter (Signed)
I talked with pt. He is aware no changes.

## 2011-10-08 NOTE — Telephone Encounter (Signed)
Pt calling re BP readings

## 2011-10-08 NOTE — Telephone Encounter (Signed)
I talked with pt. Recent BP readings: 09/18/11 139/75  09/19/11 125/76   09/20/11 134/73  09/21/11  132/75  09/22/11  126/68  09/23/11  133/74  09/24/11  148/73   09/25/11  134/70  09/26/11  142/80  09/27/11  129/64  09/28/11  133/75  09/29/11  130/69  09/30/11 124/67   10/01/11 132/72   10/02/11 121/69  Pt has no complaints. I will forward to Dr Shirlee Latch for review.

## 2011-10-24 ENCOUNTER — Encounter: Payer: Self-pay | Admitting: Internal Medicine

## 2011-10-24 ENCOUNTER — Other Ambulatory Visit (INDEPENDENT_AMBULATORY_CARE_PROVIDER_SITE_OTHER): Payer: Medicare Other

## 2011-10-24 ENCOUNTER — Ambulatory Visit (INDEPENDENT_AMBULATORY_CARE_PROVIDER_SITE_OTHER): Payer: Medicare Other | Admitting: Internal Medicine

## 2011-10-24 DIAGNOSIS — E785 Hyperlipidemia, unspecified: Secondary | ICD-10-CM

## 2011-10-24 DIAGNOSIS — I10 Essential (primary) hypertension: Secondary | ICD-10-CM

## 2011-10-24 LAB — LIPID PANEL
LDL Cholesterol: 53 mg/dL (ref 0–99)
Total CHOL/HDL Ratio: 3
Triglycerides: 83 mg/dL (ref 0.0–149.0)

## 2011-10-24 NOTE — Progress Notes (Signed)
Subjective:    Patient ID: Caleb Gray, male    DOB: [birth year], 70 y.o.   MRN: [id number]  HPI  Here for follow up - reviewed chronic medical issues today:  low back pain with ruptured disc hx 10/2009 - s/p decompression/laminectomy 10/2009 -  overall improved - little residual pain, weakness has improved - plays golf without problems  ongoing PT for mild daily discomfort-  spasm and tight after prolonged car travel (or walking) across low back  - relieved with motrin - periodic ESI with Dr. Retia Passe as needed  HTN - reports compliance with ongoing medical treatment and no changes in medication dose or frequency. denies adverse side effects related to current therapy. no chest pain  or edema - "BP always high at the doc's office -> white coat"   dyslipidemia - resumed lipitor 11/2009, increased dose 02/2011 as LDL>70 - reports 100% med compliance - denies weakness or muscle pain, no adverse side effects -   CAD hx - no angina - reports compliance with ongoing medical treatment and no changes in medication dose or frequency. denies adverse side effects related to current therapy. Follows with cards annually- 02/2009 echo with no wma and norm lv ef   hx right renal mass 2008 - benign path oncocytoma s/p total nephrect (attempt at partial unsuccessful due to size)  subsquent mild renal insuff by labs (on review)  annually checks with uro for same - last uro eval showed normal [place] -recommended recheck 2013  colon ca 1998 - s/p resection  prev followed annually with onc in [place] - est with local onc 07/2010  s/p est with [facility] 11/2010 - recommends 5year follow up 08/2013 (sooner if symptoms)  Past Medical History  Diagnosis Date  . CORONARY ARTERY DISEASE   . ANEMIA-NOS   . ASTHMA   . Colon cancer     [place]den[place] 1997 stage III T3, N1 s/p rectosigmoid resection  . HYPERLIPIDEMIA   . HYPERTENSION   . Chronic diastolic heart failure   . UNSPECIFIED PERIPHERAL VASCULAR DISEASE   . Low back pain  10/2009    s/p laminectomy/decompression L4-5  . Right kidney mass 02/2007    benign (oncocytoma) s/p resection/total nephrectomy     Review of Systems  Respiratory: Negative for cough and shortness of breath.   Cardiovascular: Negative for chest pain and leg swelling.  Neurological: Negative for weakness and headaches.       Objective:   Physical Exam  BP 128/74  Pulse 81  Temp(Src) 98.8 F (37.1 C) (Oral)  Wt 201 lb 3.2 oz (91.264 kg)  SpO2 97% Wt Readings from Last 3 Encounters:  10/24/11 201 lb 3.2 oz (91.264 kg)  09/17/11 202 lb (91.627 kg)  04/25/11 198 lb (89.812 kg)   Constitutional: He appears well-developed and well-nourished. No distress.  Neck: Normal range of motion. Neck supple. No JVD present. No thyromegaly present.  Cardiovascular: Normal rate, regular rhythm and normal heart sounds.  No murmur heard. no BLE edema Pulmonary/Chest: Effort normal and breath sounds normal. No respiratory distress. no wheezes.  Psychiatric: he has a normal mood and affect. behavior is normal. Judgment and thought content normal.   Lab Results  Component Value Date   WBC 7.9 09/06/2011   HGB 14.0 09/06/2011   HCT 40.6 09/06/2011   PLT 230 09/06/2011   CHOL 129 04/25/2011   TRIG 67.0 04/25/2011   HDL 43.70 04/25/2011   LDLDIRECT 159.7 11/28/2009   ALT 32 09/06/2011   ALT 32  09/06/2011   AST 26 09/06/2011   AST 26 09/06/2011   NA 141 09/06/2011   NA 141 09/06/2011   K 4.2 09/06/2011   K 4.2 09/06/2011   CL 108 09/06/2011   CL 108 09/06/2011   CREATININE 1.34 09/06/2011   CREATININE 1.34 09/06/2011   BUN 19 09/06/2011   BUN 19 09/06/2011   CO2 24 09/06/2011   CO2 24 09/06/2011   TSH 1.81 11/26/2007   INR 1.0 ratio 10/27/2009   HGBA1C 5.9 03/08/2009       Assessment & Plan:  See problem list. Medications and labs reviewed today.

## 2011-10-24 NOTE — Assessment & Plan Note (Signed)
The current medical regimen is effective;  continue present plan and medications. BP Readings from Last 3 Encounters:  10/24/11 128/74  09/17/11 166/78  04/25/11 134/80

## 2011-10-24 NOTE — Assessment & Plan Note (Signed)
Lipitor increase 11/2010 from 10qd to 20qd to get LDL<70 Recheck now - adjust prn 

## 2011-10-24 NOTE — Patient Instructions (Signed)
It was good to see you today. We have reviewed your prior records including labs and tests today Test(s) ordered today. Your results will be called to you after review (48-72hours after test completion). If any changes need to be made, you will be notified at that time. Medications reviewed, no changes at this time. Refill on medication(s) as discussed today. Please schedule followup in 6 months, call sooner if problems.  

## 2011-11-27 ENCOUNTER — Ambulatory Visit (INDEPENDENT_AMBULATORY_CARE_PROVIDER_SITE_OTHER): Payer: Medicare Other | Admitting: Internal Medicine

## 2011-11-27 ENCOUNTER — Encounter: Payer: Self-pay | Admitting: Internal Medicine

## 2011-11-27 VITALS — BP 148/92 | HR 87 | Temp 98.7°F | Wt 197.8 lb

## 2011-11-27 DIAGNOSIS — J329 Chronic sinusitis, unspecified: Secondary | ICD-10-CM

## 2011-11-27 DIAGNOSIS — J069 Acute upper respiratory infection, unspecified: Secondary | ICD-10-CM

## 2011-11-27 DIAGNOSIS — I10 Essential (primary) hypertension: Secondary | ICD-10-CM

## 2011-11-27 DIAGNOSIS — J209 Acute bronchitis, unspecified: Secondary | ICD-10-CM

## 2011-11-27 MED ORDER — AZITHROMYCIN 250 MG PO TABS: ORAL_TABLET | ORAL | Status: AC

## 2011-11-27 NOTE — Assessment & Plan Note (Signed)
The current medical regimen is generally effective;  continue present plan and medications. Educated to avoid decongestants as able BP Readings from Last 3 Encounters:  11/27/11 148/92  10/24/11 128/74  09/17/11 166/78

## 2011-11-27 NOTE — Patient Instructions (Signed)
It was good to see you today. Zpak antibiotics - Your prescription(s) have been submitted to your pharmacy. Please take as directed and contact our office if you believe you are having problem(s) with the medication(s). Continue Robitussin DM as neeed - keep hydrated and rest! Alternate between ibuprofen and tylenol for aches, pain and fever symptoms as discussed Call if symptoms worse or unimporved

## 2011-11-27 NOTE — Progress Notes (Signed)
  Subjective:    HPI  complains of cold symptoms  Onset >3 week ago, wax/wane symptoms  Initially associated with rhinorrhea, sore throat, mild headache and low grade fever Also myalgias, sinus pressure with nasal discharge and mild-mod chest congestion >thick yellow/white sputum No relief with OTC meds Precipitated by sick contacts and holiday travels  Past Medical History  Diagnosis Date  . CORONARY ARTERY DISEASE   . ANEMIA-NOS   . ASTHMA   . Colon cancer     Aden[place] 1997 stage III T3, N1 s/p rectosigmoid resection  . HYPERLIPIDEMIA   . HYPERTENSION   . Chronic diastolic heart failure   . UNSPECIFIED PERIPHERAL VASCULAR DISEASE   . Low back pain 10/2009    s/p laminectomy/decompression L4-5  . Right kidney mass 02/2007    benign (oncocytoma) s/p resection/total nephrectomy    Review of Systems Constitutional: No night sweats, no unexpected weight change Pulmonary: No pleurisy or hemoptysis Cardiovascular: No chest pain or palpitations     Objective:   Physical Exam BP 148/92  Pulse 87  Temp(Src) 98.7 F (37.1 C) (Oral)  Wt 197 lb 12.8 oz (89.721 kg)  SpO2 97% GEN: mildly ill appearing and audible head/chest congestion HENT: NCAT, mild sinus tenderness bilaterally, nares with clear discharge, oropharynx mild erythema, no exudate Eyes: Vision grossly intact, no conjunctivitis Lungs: Clear to auscultation without rhonchi or wheeze, no increased work of breathing Cardiovascular: Regular rate and rhythm, no bilateral edema      Assessment & Plan:  Viral URI 3 weeks ago>> residual sinusitis and bronchitis Cough, postnasal drip related to above    Empiric antibiotics prescribed due to symptom duration greater than 7 days OTC cough suppression - new prescriptions done Symptomatic care with Tylenol or Advil, hydration and rest -  salt gargle advised as needed

## 2012-01-15 DIAGNOSIS — H2510 Age-related nuclear cataract, unspecified eye: Secondary | ICD-10-CM | POA: Diagnosis not present

## 2012-03-05 ENCOUNTER — Ambulatory Visit (HOSPITAL_BASED_OUTPATIENT_CLINIC_OR_DEPARTMENT_OTHER): Payer: Medicare Other | Admitting: Oncology

## 2012-03-05 ENCOUNTER — Telehealth: Payer: Self-pay | Admitting: Oncology

## 2012-03-05 ENCOUNTER — Other Ambulatory Visit (HOSPITAL_BASED_OUTPATIENT_CLINIC_OR_DEPARTMENT_OTHER): Payer: Medicare Other | Admitting: Lab

## 2012-03-05 ENCOUNTER — Ambulatory Visit: Payer: Medicare Other | Admitting: Oncology

## 2012-03-05 ENCOUNTER — Other Ambulatory Visit: Payer: Self-pay | Admitting: Oncology

## 2012-03-05 VITALS — BP 162/83 | HR 68 | Temp 97.9°F | Ht 71.0 in | Wt 200.6 lb

## 2012-03-05 DIAGNOSIS — Z905 Acquired absence of kidney: Secondary | ICD-10-CM | POA: Diagnosis not present

## 2012-03-05 DIAGNOSIS — Z85528 Personal history of other malignant neoplasm of kidney: Secondary | ICD-10-CM

## 2012-03-05 DIAGNOSIS — Z85038 Personal history of other malignant neoplasm of large intestine: Secondary | ICD-10-CM

## 2012-03-05 DIAGNOSIS — C189 Malignant neoplasm of colon, unspecified: Secondary | ICD-10-CM | POA: Diagnosis not present

## 2012-03-05 DIAGNOSIS — Z9049 Acquired absence of other specified parts of digestive tract: Secondary | ICD-10-CM | POA: Diagnosis not present

## 2012-03-05 LAB — CBC WITH DIFFERENTIAL/PLATELET
BASO%: 1.2 % (ref 0.0–2.0)
Basophils Absolute: 0.1 10*3/uL (ref 0.0–0.1)
EOS%: 4.9 % (ref 0.0–7.0)
HGB: 13.6 g/dL (ref 13.0–17.1)
MCH: 33 pg (ref 27.2–33.4)
MCHC: 34.8 g/dL (ref 32.0–36.0)
MONO#: 0.7 10*3/uL (ref 0.1–0.9)
RDW: 12.7 % (ref 11.0–14.6)
WBC: 7.4 10*3/uL (ref 4.0–10.3)
lymph#: 1.8 10*3/uL (ref 0.9–3.3)

## 2012-03-05 LAB — COMPREHENSIVE METABOLIC PANEL
AST: 24 U/L (ref 0–37)
BUN: 15 mg/dL (ref 6–23)
CO2: 27 mEq/L (ref 19–32)
Calcium: 9.3 mg/dL (ref 8.4–10.5)
Chloride: 106 mEq/L (ref 96–112)
Creatinine, Ser: 1.49 mg/dL — ABNORMAL HIGH (ref 0.50–1.35)
Glucose, Bld: 76 mg/dL (ref 70–99)

## 2012-03-05 LAB — CEA: CEA: <0.5 ng/mL (ref 0.0–5.0)

## 2012-03-05 NOTE — Progress Notes (Signed)
Hematology and Oncology Follow Up Visit  Caleb Gray [id number] [birth year] 71 y.o. 03/05/2012 2:11 PM  CC: Caleb Ports A. Felicity Coyer, MD  Caleb Gray, M.D.    Principle Diagnosis:  This is a 71 year old gentleman with the following diagnoses: 1. History of T3 N1 colon cancer diagnosed in 1997, status post rectosigmoid resection, adjuvant chemotherapy with 5-FU without any evidence to suggest recurrent disease. 2.     Oncocytic tumor of the kidney, status post nephrectomy of the right kidney.  No evidence of any recurrent disease since 2008.    Current therapy: :  Observation and surveillance.  Interim History: [name] presents today for a followup visit.  He is a very pleasant gentleman with the above history.  His previous treatment was done in [place], predominantly his nephrectomy as well as colon resection.  He established care with me in October 2011.  Since the last time I saw him, he had not reported any major problems, had not reported any chest pain, had not reported any difficulty breathing, had not reported any abdominal pain or urinary symptoms.  For the most part, activity level remains about the same. No new complaints today.   Medications: I have reviewed the patient's current medications. Current outpatient prescriptions:albuterol (PROAIR HFA) 108 (90 BASE) MCG/ACT inhaler, Inhale 2 puffs into the lungs 4 (four) times daily as needed., Disp: 18 g, Rfl: 3;  aspirin 81 MG tablet, Take 81 mg by mouth daily.  , Disp: , Rfl: ;  atorvastatin (LIPITOR) 20 MG tablet, Take 1 tablet (20 mg total) by mouth at bedtime., Disp: 90 tablet, Rfl: 3 diltiazem (CARDIZEM CD) 180 MG 24 hr capsule, Take 1 capsule (180 mg total) by mouth daily., Disp: 90 capsule, Rfl: 3;  enalapril (VASOTEC) 20 MG tablet, Take 1 tablet (20 mg total) by mouth daily., Disp: 90 tablet, Rfl: 3;  ibuprofen (ADVIL,MOTRIN) 800 MG tablet, Take 800 mg by mouth as needed.  , Disp: , Rfl: ;  Multiple Vitamins-Minerals  (CENTRUM SILVER) tablet, Take 1 tablet by mouth daily.  , Disp: , Rfl:  Omega-3 Fatty Acids (FISH OIL) 1000 MG CAPS, Take by mouth 2 (two) times daily. , Disp: , Rfl:   Allergies:  Allergies  Allergen Reactions  . Penicillins     Past Medical History, Surgical history, Social history, and Family History were reviewed and updated.  Review of Systems:no chest pain or dyspnea on exertion Constitutional:  Negative for fever, chills, night sweats, anorexia, weight loss, pain. Cardiovascular: negative. Respiratory: negative Neurological: negative Dermatological: negative ENT: negative Skin: Negative. Gastrointestinal: negative Genito-Urinary: negative Hematological and Lymphatic: negative Breast: negative Musculoskeletal: negative Remaining ROS negative. Physical Exam: Blood pressure 162/83, pulse 68, temperature 97.9 F (36.6 C), temperature source Oral, height 5\' 11"  (1.803 m), weight 200 lb 9.6 oz (90.992 kg). ECOG: 0 General appearance: alert Head: Normocephalic, without obvious abnormality, atraumatic Neck: no adenopathy, no carotid bruit, no JVD, supple, symmetrical, trachea midline and thyroid not enlarged, symmetric, no tenderness/mass/nodules Lymph nodes: Cervical, supraclavicular, and axillary nodes normal. Heart:regular rate and rhythm, S1, S2 normal, no murmur, click, rub or gallop Lung:chest clear, no wheezing, rales, normal symmetric air entry Abdomin: soft, non-tender, without masses or organomegaly EXT:no evidence of joint effusion   Lab Results: Lab Results  Component Value Date   WBC 7.4 03/05/2012   HGB 13.6 03/05/2012   HCT 39.1 03/05/2012   MCV 94.8 03/05/2012   PLT 214 03/05/2012      Impression and Plan:  This is a  pleasant 71 year old gentleman with the following issues: 1. Colon cancer diagnosed in 1997.  He had stage III disease with no evidence to suggest recurrent disease.  I will repeat his liver function tests and CEA today.  2. Oncocytic  tumor of the kidney, status post nephrectomy.  No evidence to suggest recurrent disease.  Continue on active surveillance.  I will schedule him for followup every 6 months.    ,, MD4/25/20132:11 PM

## 2012-03-05 NOTE — Telephone Encounter (Signed)
appt made and printed for pt aom °

## 2012-03-11 ENCOUNTER — Telehealth: Payer: Self-pay | Admitting: Cardiology

## 2012-03-11 MED ORDER — AMLODIPINE BESYLATE 5 MG PO TABS: 5.0000 mg | ORAL_TABLET | Freq: Every day | ORAL | Status: DC

## 2012-03-11 NOTE — Telephone Encounter (Signed)
Pt b/p is elevated at 3:30 it was 192/104 and pt was wondering why this would be ans he wants to talk to someone about

## 2012-03-11 NOTE — Telephone Encounter (Signed)
Spoke with pt. He has had several readings today in the 190/100 range. Pt denies any changes. Pt states his BP yesterday was 140/82. Pt did check his BP on his neighbor's machine --?120s/70s. Pt not sure this is accurate. Pt is going to drug store to check his BP now.

## 2012-03-11 NOTE — Telephone Encounter (Signed)
Spoke with pt. Pt states his BP at drug store was in the 190/200/100-110 range. Pt went home and took a diltiazem CD 180. He states his BP is now 194/101.

## 2012-03-11 NOTE — Telephone Encounter (Signed)
Per Dr Tawanna Solo diltiazem and start amlodipine 5mg  daily. Spoke with pt about Dr Alford Highland recommendations. He will call in about 2 weeks with BP readings. I also discussed limiting ibuprofen use with pt.

## 2012-03-11 NOTE — Telephone Encounter (Signed)
I reviewed with Dr Shirlee Latch. Per Dr Shirlee Latch pt can take extra enalapril tonight.

## 2012-04-15 DIAGNOSIS — D3000 Benign neoplasm of unspecified kidney: Secondary | ICD-10-CM | POA: Diagnosis not present

## 2012-04-15 DIAGNOSIS — N189 Chronic kidney disease, unspecified: Secondary | ICD-10-CM | POA: Diagnosis not present

## 2012-04-15 DIAGNOSIS — N400 Enlarged prostate without lower urinary tract symptoms: Secondary | ICD-10-CM | POA: Diagnosis not present

## 2012-04-16 DIAGNOSIS — D3000 Benign neoplasm of unspecified kidney: Secondary | ICD-10-CM | POA: Diagnosis not present

## 2012-04-23 ENCOUNTER — Other Ambulatory Visit (INDEPENDENT_AMBULATORY_CARE_PROVIDER_SITE_OTHER): Payer: Medicare Other

## 2012-04-23 ENCOUNTER — Encounter: Payer: Self-pay | Admitting: Internal Medicine

## 2012-04-23 ENCOUNTER — Ambulatory Visit (INDEPENDENT_AMBULATORY_CARE_PROVIDER_SITE_OTHER): Payer: Medicare Other | Admitting: Internal Medicine

## 2012-04-23 VITALS — BP 146/82 | HR 78 | Temp 97.9°F | Ht 71.0 in | Wt 196.8 lb

## 2012-04-23 DIAGNOSIS — E785 Hyperlipidemia, unspecified: Secondary | ICD-10-CM

## 2012-04-23 DIAGNOSIS — Z79899 Other long term (current) drug therapy: Secondary | ICD-10-CM | POA: Diagnosis not present

## 2012-04-23 DIAGNOSIS — M545 Low back pain, unspecified: Secondary | ICD-10-CM | POA: Diagnosis not present

## 2012-04-23 DIAGNOSIS — I10 Essential (primary) hypertension: Secondary | ICD-10-CM | POA: Diagnosis not present

## 2012-04-23 LAB — LIPID PANEL
Cholesterol: 107 mg/dL (ref 0–200)
HDL: 43.3 mg/dL (ref >39.00)
Triglycerides: 68 mg/dL (ref 0.0–149.0)

## 2012-04-23 LAB — HEPATIC FUNCTION PANEL
ALT: 26 U/L (ref 0–53)
AST: 29 U/L (ref 0–37)
Albumin: 3.8 g/dL (ref 3.5–5.2)
Alkaline Phosphatase: 80 U/L (ref 39–117)

## 2012-04-23 MED ORDER — IBUPROFEN 800 MG PO TABS: 800.0000 mg | ORAL_TABLET | ORAL | Status: DC | PRN

## 2012-04-23 NOTE — Assessment & Plan Note (Signed)
S/p L4-5 laminectomy/decompression 10/2009 - mild residual back issues but initial leg pain resolved s/p eval/tx by PMR and PT ([facility]) - continue same as needed Alternate tylenol and NSAIDs as ongoing for pain relief as needed

## 2012-04-23 NOTE — Assessment & Plan Note (Signed)
Lipitor increase 11/2010 from 10qd to 20qd to get LDL<70 Recheck now - adjust prn 

## 2012-04-23 NOTE — Assessment & Plan Note (Signed)
The current medical regimen is generally effective;  continue present plan and medications. Educated to avoid decongestants as able BP Readings from Last 3 Encounters:  04/23/12 146/82  03/05/12 162/83  11/27/11 148/92

## 2012-04-23 NOTE — Patient Instructions (Signed)
It was good to see you today. We have reviewed your prior records including labs and tests today Test(s) ordered today. Your results will be called to you after review (48-72hours after test completion). If any changes need to be made, you will be notified at that time. Medications reviewed, no changes at this time. Refill on medication(s) as discussed today. Please schedule followup in 6 months, call sooner if problems.  

## 2012-04-23 NOTE — Progress Notes (Signed)
Subjective:    Patient ID: Caleb Gray, male    DOB: September 19, 1941, 71 y.o.   MRN: [id number]  HPI  Here for follow up - reviewed chronic medical issues today:  low back pain with ruptured disc hx 10/2009 - s/p decompression/laminectomy 10/2009 -  overall improved - mild residual pain, no weakness - plays golf without problems  ongoing PT for mild daily discomfort-  spasm and tight after prolonged car travel (or walking) across low back  - relieved with motrin  - requests refills - occ ESI with Dr. Retia Passe as needed  HTN - reports compliance with ongoing medical treatment and no changes in medication dose or frequency. denies adverse side effects related to current therapy. no chest pain or edema - "BP always up at the doc's office - white coat"   dyslipidemia - resumed lipitor 11/2009, increased dose 02/2011 as LDL>70 - reports 100% med compliance - denies weakness or muscle pain, no adverse side effects -   CAD hx - no angina - reports compliance with ongoing medical treatment and no changes in medication dose or frequency. denies adverse side effects related to current therapy. Follows with cards annually- 02/2009 echo with no wma and norm lv ef   hx right renal mass 2008 - benign path oncocytoma s/p total nephrect (attempt at partial unsuccessful due to size)  subsquent mild renal insuff by labs (on review)  annually checks with uro for same - last uro eval showed normal [place] - to reck 2 years (2013)  colon ca 1998 - s/p resection  prev followed annually with onc in [place] - est with local onc 07/2010  s/p est with [facility] 11/2010 - recommends 5year follow up 08/2013 (sooner if symptoms)  Past Medical History  Diagnosis Date  . CORONARY ARTERY DISEASE   . ANEMIA-NOS   . ASTHMA   . Colon cancer     [place] 1997 stage III T3, N1 s/p rectosigmoid resection  . HYPERLIPIDEMIA   . HYPERTENSION   . Chronic diastolic heart failure   . UNSPECIFIED PERIPHERAL VASCULAR DISEASE   . Low back pain  10/2009    s/p laminectomy/decompression L4-5  . Right kidney mass 02/2007    benign (oncocytoma) s/p resection/total nephrectomy     Review of Systems  Respiratory: Negative for cough and shortness of breath.   Cardiovascular: Negative for chest pain and leg swelling.  Neurological: Negative for weakness and headaches.       Objective:   Physical Exam  BP 146/82  Pulse 78  Temp 97.9 F (36.6 C) (Oral)  Ht 5\' 11"  (1.803 m)  Wt 196 lb 12.8 oz (89.268 kg)  BMI 27.45 kg/m2  SpO2 97% Wt Readings from Last 3 Encounters:  04/23/12 196 lb 12.8 oz (89.268 kg)  03/05/12 200 lb 9.6 oz (90.992 kg)  11/27/11 197 lb 12.8 oz (89.721 kg)    Constitutional:  he appears well-developed and well-nourished. No distress.  Neck: Normal range of motion. Neck supple. No JVD present. No thyromegaly present.  Cardiovascular: Normal rate, regular rhythm and normal heart sounds.  No murmur heard. no BLE edema Pulmonary/Chest: Effort normal and breath sounds normal. No respiratory distress. no wheezes.  Neurological: he is alert and oriented to person, place, and time. No cranial nerve deficit. Coordination normal.  Psychiatric: he has a normal mood and affect. behavior is normal. Judgment and thought content normal.   Lab Results  Component Value Date   WBC 7.4 03/05/2012   HGB 13.6 03/05/2012  HCT 39.1 03/05/2012   PLT 214 03/05/2012   CHOL 112 10/24/2011   TRIG 83.0 10/24/2011   HDL 42.00 10/24/2011   LDLDIRECT 159.7 11/28/2009   ALT 25 03/05/2012   AST 24 03/05/2012   NA 139 03/05/2012   K 4.5 03/05/2012   CL 106 03/05/2012   CREATININE 1.49* 03/05/2012   BUN 15 03/05/2012   CO2 27 03/05/2012   TSH 1.81 11/26/2007   INR 1.0 ratio 10/27/2009   HGBA1C 5.9 03/08/2009       Assessment & Plan:  See problem list. Medications and labs reviewed today.

## 2012-07-06 ENCOUNTER — Telehealth: Payer: Self-pay | Admitting: Internal Medicine

## 2012-07-06 MED ORDER — ENALAPRIL MALEATE 20 MG PO TABS: 20.0000 mg | ORAL_TABLET | Freq: Every day | ORAL | Status: DC

## 2012-07-06 MED ORDER — ATORVASTATIN CALCIUM 20 MG PO TABS: 20.0000 mg | ORAL_TABLET | Freq: Every day | ORAL | Status: DC

## 2012-07-06 MED ORDER — ALBUTEROL SULFATE HFA 108 (90 BASE) MCG/ACT IN AERS: 2.0000 | INHALATION_SPRAY | Freq: Four times a day (QID) | RESPIRATORY_TRACT | Status: DC | PRN

## 2012-07-06 NOTE — Telephone Encounter (Signed)
Not[facility]... 07/06/12@11 :58am/LMB

## 2012-07-06 NOTE — Telephone Encounter (Signed)
Pt req med refill to be send to [facility] 90 days supply. Atrovastatin, Enalapril and Proair. Please req phone call from the nurse. Please call pt when you get a chance.

## 2012-08-06 ENCOUNTER — Other Ambulatory Visit: Payer: Self-pay | Admitting: Internal Medicine

## 2012-08-07 ENCOUNTER — Other Ambulatory Visit: Payer: Self-pay | Admitting: General Practice

## 2012-08-07 MED ORDER — IBUPROFEN 800 MG PO TABS: 800.0000 mg | ORAL_TABLET | ORAL | Status: DC | PRN

## 2012-09-03 ENCOUNTER — Telehealth: Payer: Self-pay | Admitting: Oncology

## 2012-09-03 ENCOUNTER — Ambulatory Visit (HOSPITAL_BASED_OUTPATIENT_CLINIC_OR_DEPARTMENT_OTHER): Payer: Medicare Other | Admitting: Oncology

## 2012-09-03 ENCOUNTER — Other Ambulatory Visit (HOSPITAL_BASED_OUTPATIENT_CLINIC_OR_DEPARTMENT_OTHER): Payer: Medicare Other | Admitting: Lab

## 2012-09-03 VITALS — BP 175/94 | HR 97 | Temp 99.2°F | Wt 202.1 lb

## 2012-09-03 DIAGNOSIS — C649 Malignant neoplasm of unspecified kidney, except renal pelvis: Secondary | ICD-10-CM

## 2012-09-03 DIAGNOSIS — D649 Anemia, unspecified: Secondary | ICD-10-CM

## 2012-09-03 DIAGNOSIS — Z85038 Personal history of other malignant neoplasm of large intestine: Secondary | ICD-10-CM

## 2012-09-03 DIAGNOSIS — C189 Malignant neoplasm of colon, unspecified: Secondary | ICD-10-CM

## 2012-09-03 LAB — COMPREHENSIVE METABOLIC PANEL (CC13)
Albumin: 3.8 g/dL (ref 3.5–5.0)
Alkaline Phosphatase: 86 U/L (ref 40–150)
BUN: 17 mg/dL (ref 7.0–26.0)
Calcium: 9.7 mg/dL (ref 8.4–10.4)
Glucose: 82 mg/dl (ref 70–99)
Sodium: 140 mEq/L (ref 136–145)
Total Bilirubin: 0.7 mg/dL (ref 0.20–1.20)
Total Protein: 6.6 g/dL (ref 6.4–8.3)

## 2012-09-03 LAB — CBC WITH DIFFERENTIAL/PLATELET
Eosinophils Absolute: 0.4 10*3/uL (ref 0.0–0.5)
LYMPH%: 21.9 % (ref 14.0–49.0)
MCHC: 35.1 g/dL (ref 32.0–36.0)
MCV: 94.5 fL (ref 79.3–98.0)
MONO%: 8.7 % (ref 0.0–14.0)
NEUT#: 5.1 10*3/uL (ref 1.5–6.5)
Platelets: 238 10*3/uL (ref 140–400)
RBC: 4.32 10*6/uL (ref 4.20–5.82)

## 2012-09-03 NOTE — Telephone Encounter (Signed)
Printed and gv pt appt schedule for OCT 2014.

## 2012-09-03 NOTE — Progress Notes (Signed)
Hematology and Oncology Follow Up Visit  Caleb Gray [id number] 1941-06-09 71 y.o. 09/03/2012 1:32 PM  CC: Caleb Ports A. Felicity Coyer, MD  Caleb Gray, M.D.    Principle Diagnosis:  This is a 71 year old gentleman with the following diagnoses: 1. History of T3 N1 colon cancer diagnosed in 1997, status post rectosigmoid resection, adjuvant chemotherapy with 5-FU without any evidence to suggest recurrent disease. 2.     Oncocytic tumor of the kidney, status post nephrectomy of the right kidney.  No evidence of any recurrent disease since 2008.  Current therapy: Observation and surveillance.  Interim History: Caleb Gray presents today for a followup visit.  He is a very pleasant gentleman with the above history.  His previous treatment was done in [place], predominantly his nephrectomy as well as colon resection.  He established care with me in October 2011.  Since the last time I saw him, he had not reported any major problems, had not reported any chest pain, had not reported any difficulty breathing, had not reported any abdominal pain or urinary symptoms.  For the most part, activity level remains about the same. No new complaints today.   Medications: I have reviewed the patient's current medications. Current outpatient prescriptions:albuterol (PROAIR HFA) 108 (90 BASE) MCG/ACT inhaler, Inhale 2 puffs into the lungs 4 (four) times daily as needed., Disp: 72 g, Rfl: 0;  amLODipine (NORVASC) 5 MG tablet, Take 1 tablet (5 mg total) by mouth daily., Disp: 30 tablet, Rfl: 6;  aspirin 81 MG tablet, Take 81 mg by mouth daily.  , Disp: , Rfl: ;  atorvastatin (LIPITOR) 20 MG tablet, Take 1 tablet (20 mg total) by mouth at bedtime., Disp: 90 tablet, Rfl: 3 enalapril (VASOTEC) 20 MG tablet, Take 1 tablet (20 mg total) by mouth daily., Disp: 90 tablet, Rfl: 3;  ibuprofen (ADVIL,MOTRIN) 800 MG tablet, Take 1 tablet (800 mg total) by mouth as needed., Disp: 30 tablet, Rfl: 1;  Multiple Vitamins-Minerals  (CENTRUM SILVER) tablet, Take 1 tablet by mouth daily.  , Disp: , Rfl: ;  Omega-3 Fatty Acids (FISH OIL) 1000 MG CAPS, Take by mouth 2 (two) times daily. , Disp: , Rfl:   Allergies:  Allergies  Allergen Reactions  . Penicillins     Past Medical History, Surgical history, Social history, and Family History were reviewed and updated.  Review of Systems:no chest pain or dyspnea on exertion Constitutional:  Negative for fever, chills, night sweats, anorexia, weight loss, pain. Cardiovascular: negative. Respiratory: negative Neurological: negative Dermatological: negative ENT: negative Skin: Negative. Gastrointestinal: negative Genito-Urinary: negative Hematological and Lymphatic: negative Breast: negative Musculoskeletal: negative Remaining ROS negative. Physical Exam: Blood pressure 175/94, pulse 97, temperature 99.2 F (37.3 C), temperature source Oral, weight 202 lb 1 oz (91.655 kg). ECOG: 0 General appearance: alert Head: Normocephalic, without obvious abnormality, atraumatic Neck: no adenopathy, no carotid bruit, no JVD, supple, symmetrical, trachea midline and thyroid not enlarged, symmetric, no tenderness/mass/nodules Lymph nodes: Cervical, supraclavicular, and axillary nodes normal. Heart:regular rate and rhythm, S1, S2 normal, no murmur, click, rub or gallop Lung:chest clear, no wheezing, rales, normal symmetric air entry Abdomin: soft, non-tender, without masses or organomegaly EXT:no evidence of joint effusion   Lab Results: Lab Results  Component Value Date   WBC 8.0 09/03/2012   HGB 14.3 09/03/2012   HCT 40.8 09/03/2012   MCV 94.5 09/03/2012   PLT 238 09/03/2012    Impression and Plan:  This is a pleasant 71 year-old gentleman with the following issues: 1. Colon cancer  diagnosed in 1997.  He had stage III disease with no evidence to suggest recurrent disease.   2. Oncocytic tumor of the kidney, status post nephrectomy.  No evidence to suggest recurrent  disease.  Continue on active surveillance.  I will schedule him for followup every 12 months.    ,, MD10/24/20131:32 PM

## 2012-09-16 ENCOUNTER — Ambulatory Visit (INDEPENDENT_AMBULATORY_CARE_PROVIDER_SITE_OTHER): Payer: Medicare Other | Admitting: Cardiology

## 2012-09-16 ENCOUNTER — Encounter: Payer: Self-pay | Admitting: Cardiology

## 2012-09-16 VITALS — BP 132/86 | HR 79 | Ht 71.0 in | Wt 197.0 lb

## 2012-09-16 DIAGNOSIS — E785 Hyperlipidemia, unspecified: Secondary | ICD-10-CM

## 2012-09-16 DIAGNOSIS — I10 Essential (primary) hypertension: Secondary | ICD-10-CM

## 2012-09-16 DIAGNOSIS — I2510 Atherosclerotic heart disease of native coronary artery without angina pectoris: Secondary | ICD-10-CM

## 2012-09-16 MED ORDER — AMLODIPINE BESYLATE 5 MG PO TABS: 5.0000 mg | ORAL_TABLET | Freq: Every day | ORAL | Status: DC

## 2012-09-16 NOTE — Patient Instructions (Addendum)
Your physician wants you to follow-up in: 1 year with Dr McLean. (November 2014).  You will receive a reminder letter in the mail two months in advance. If you don't receive a letter, please call our office to schedule the follow-up appointment.  

## 2012-09-18 NOTE — Progress Notes (Signed)
Patient ID: Caleb Gray, male   DOB: 1941-04-26, 71 y.o.   MRN: [id number] PCP: Dr. Felicity Coyer  71 yo with history of CAD, colon cancer, HTN, and right nephrectomy presents for cardiology followup. Patient had pre-op testing before nephrectomy in 2008. Stress test was suggestive of coronary disease. He had never had chest pain or exertional dyspnea. Cardiac cath was done showing 70% mid LAD and 90% distal RCA. Given lack of symptoms, he was managed medically. Since that time, he has had no cardiac problems. He had a low risk myoview in 2009. He still has never had chest pain. He walks for 2 miles on occasion for exercise and golfs with no exertional dyspnea. Echo in 4/11 showed preserved LV systolic function and moderate diastolic dysfunction.  No claudication.  BP has been under good control.    Labs (1/11): creatinine 1.4, K 4.5, HDL 37, LDL 160 (off Lipitor)  Labs (5/11): LDL 76, HDL 41  Labs (9/11): LDL 82, HDL 33.7  Labs (6/12): LDL 72, HDL 44 Labs (10/12): K 4.2, creatinine 1.34 Labs (6/13): LDL 50, HDL 43 Labs (10/13): K 4.3, creatinine 1.1, HCT 40.8  ECG: NSR, old inferior MI  Allergies (verified):  1) ! Penicillin   Past Medical History:  1. Colon cancer - adenoca 1997 s/p rectosigmoid resection.  2. Coronary artery disease - 2 vessel disease by cath in 4/08 in [place]: 70% mid LAD, 90% dist RCA managed medically; LVEF 55%. ETT-myoview 8/09 - fixed deficit at basal inferior segment (small), low risk. Echo (4/11): EF 55-60%, no regional wall motion abnormalities, moderate diastolic dysfunction, normal RV and normal PA pressure.  3. PAD - Abnormal ABIs in 9/09 per patient. He says a further workup was done (he is not sure what) and he was told things were "ok"  4. Diverticulitis, hx of  5. Hyperlipidemia  6. Hypertension  7. 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  8. Asthma  9. Low back pain s/p ruptured disc, had spine surgery in 12/10.   Current Outpatient  Prescriptions  Medication Sig Dispense Refill  . albuterol (PROAIR HFA) 108 (90 BASE) MCG/ACT inhaler Inhale 2 puffs into the lungs 4 (four) times daily as needed.  72 g  0  . amLODipine (NORVASC) 5 MG tablet Take 1 tablet (5 mg total) by mouth daily.  90 tablet  3  . aspirin 81 MG tablet Take 81 mg by mouth daily.        Marland Kitchen atorvastatin (LIPITOR) 20 MG tablet Take 1 tablet (20 mg total) by mouth at bedtime.  90 tablet  3  . enalapril (VASOTEC) 20 MG tablet Take 1 tablet (20 mg total) by mouth daily.  90 tablet  3  . ibuprofen (ADVIL,MOTRIN) 800 MG tablet Take 1 tablet (800 mg total) by mouth as needed.  30 tablet  1  . Multiple Vitamins-Minerals (CENTRUM SILVER) tablet Take 1 tablet by mouth daily.        . Omega-3 Fatty Acids (FISH OIL) 1000 MG CAPS Take by mouth 2 (two) times daily.         BP 132/86  Pulse 79  Ht 5\' 11"  (1.803 m)  Wt 197 lb (89.359 kg)  BMI 27.48 kg/m2 General: NAD Neck: No JVD, no thyromegaly or thyroid nodule.  Lungs: Clear to auscultation bilaterally with normal respiratory effort. CV: Nondisplaced PMI.  Heart regular S1/S2, no S3/S4, no murmur.  No peripheral edema.  No carotid bruit.  Normal pedal pulses.  Abdomen: Soft, nontender,  no hepatosplenomegaly, no distention.  Neurologic: Alert and oriented x 3.  Psych: Normal affect. Extremities: No clubbing or cyanosis.   Assessment/Plan:   HYPERLIPIDEMIA LDL was at goal (< 70) when last checked.  CORONARY ARTERY DISEASE  Stable with no ischemic symptoms. His ECG this year shows inferior Qs. It is possible that his distal RCA occluded (90% stenosis on prior cath) but he has had no symptoms consistent with this. Continue ASA, statin, enalapril.  HYPERTENSION  BP looks better on amlodipine.  Continue current regimen.   Marca Ancona 09/18/2012

## 2012-10-21 ENCOUNTER — Encounter: Payer: Self-pay | Admitting: Internal Medicine

## 2012-10-21 ENCOUNTER — Ambulatory Visit (INDEPENDENT_AMBULATORY_CARE_PROVIDER_SITE_OTHER): Payer: Medicare Other | Admitting: Internal Medicine

## 2012-10-21 ENCOUNTER — Other Ambulatory Visit (INDEPENDENT_AMBULATORY_CARE_PROVIDER_SITE_OTHER): Payer: Medicare Other

## 2012-10-21 VITALS — BP 144/82 | HR 89 | Temp 98.4°F | Ht 71.0 in | Wt 199.6 lb

## 2012-10-21 DIAGNOSIS — M545 Low back pain, unspecified: Secondary | ICD-10-CM

## 2012-10-21 DIAGNOSIS — E785 Hyperlipidemia, unspecified: Secondary | ICD-10-CM | POA: Diagnosis not present

## 2012-10-21 DIAGNOSIS — I10 Essential (primary) hypertension: Secondary | ICD-10-CM

## 2012-10-21 LAB — LIPID PANEL
Cholesterol: 116 mg/dL (ref 0–200)
HDL: 38.4 mg/dL — ABNORMAL LOW (ref >39.00)
VLDL: 13.8 mg/dL (ref 0.0–40.0)

## 2012-10-21 MED ORDER — IBUPROFEN 800 MG PO TABS: 800.0000 mg | ORAL_TABLET | ORAL | Status: DC | PRN

## 2012-10-21 NOTE — Assessment & Plan Note (Signed)
The current medical regimen is generally effective;  continue present plan and medications.  Educated to avoid decongestants and NSAIDs as able BP Readings from Last 3 Encounters:  10/21/12 144/82  09/16/12 132/86  09/03/12 175/94

## 2012-10-21 NOTE — Assessment & Plan Note (Signed)
Lipitor increase 11/2010 from 10qd to 20qd to get LDL<70 Recheck now - adjust prn

## 2012-10-21 NOTE — Assessment & Plan Note (Signed)
S/p L4-5 laminectomy/decompression 10/2009 - mild intermittent residual back issues but initial leg pain resolved s/p eval/tx by PMR and PT ([facility]) - continue same as needed Alternate tylenol and NSAIDs as ongoing for pain relief as needed

## 2012-10-21 NOTE — Patient Instructions (Signed)
It was good to see you today. We have reviewed your prior records including labs and tests today Test(s) ordered today. Your results will be released to MyChart (or called to you) after review, usually within 72hours after test completion. If any changes need to be made, you will be notified at that same time. Medications reviewed, no changes at this time. Refill on medication(s) as discussed today. Please schedule followup in 6 months, call sooner if problems.

## 2012-10-21 NOTE — Progress Notes (Signed)
Subjective:    Patient ID: Caleb Gray, male    DOB: [birth year], 71 y.o.   MRN: [id number]  HPI  Here for follow up - reviewed chronic medical issues today:  low back pain with ruptured disc hx 10/2009 - s/p decompression/laminectomy 10/2009 -  overall improved - mild residual pain, no weakness - plays golf without problems  ongoing PT for mild daily discomfort-  spasm and tight after prolonged car travel (or walking) across low back  - relieved with motrin  - requests refills - occ ESI with Dr. Retia Passe as needed  HTN - reports compliance with ongoing medical treatment and no changes in medication dose or frequency. denies adverse side effects related to current therapy. no chest pain or edema - "BP always up at the doc's office - white coat"   dyslipidemia - resumed lipitor 11/2009, increased dose 02/2011 as LDL>70 - reports 100% med compliance - denies weakness or muscle pain, no adverse side effects -   CAD hx - no angina - reports compliance with ongoing medical treatment and no changes in medication dose or frequency. denies adverse side effects related to current therapy. Follows with cards annually- 02/2009 echo with no wma and norm lv ef   hx right renal mass 2008 - benign path oncocytoma s/p total nephrect (attempt at partial unsuccessful due to size)  subsquent mild renal insuff by labs (on review)  annually checks with uro for same - last uro eval showed normal [place] - to reck 2 years (2013)  colon ca 1998 - s/p resection  prev followed annually with onc in [place] - est with local onc 07/2010  s/p est with [facility] 11/2010 - recommends 5year follow up 08/2013 (sooner if symptoms)  Past Medical History  Diagnosis Date  . CORONARY ARTERY DISEASE   . ANEMIA-NOS   . ASTHMA   . Colon cancer     [place] 1997 stage III T3, N1 s/p rectosigmoid resection  . HYPERLIPIDEMIA   . HYPERTENSION   . Chronic diastolic heart failure   . UNSPECIFIED PERIPHERAL VASCULAR DISEASE   . Low back pain  10/2009    s/p laminectomy/decompression L4-5  . Right kidney mass 02/2007    benign (oncocytoma) s/p resection/total nephrectomy     Review of Systems  Respiratory: Negative for cough and shortness of breath.   Cardiovascular: Negative for chest pain and leg swelling.  Neurological: Negative for weakness and headaches.       Objective:   Physical Exam  BP 144/82  Pulse 89  Temp 98.4 F (36.9 C) (Oral)  Ht 5\' 11"  (1.803 m)  Wt 199 lb 9.6 oz (90.538 kg)  BMI 27.84 kg/m2  SpO2 96% Wt Readings from Last 3 Encounters:  10/21/12 199 lb 9.6 oz (90.538 kg)  09/16/12 197 lb (89.359 kg)  09/03/12 202 lb 1 oz (91.655 kg)    Constitutional:  he appears well-developed and well-nourished. No distress.  Neck: Normal range of motion. Neck supple. No JVD present. No thyromegaly present.  Cardiovascular: Normal rate, regular rhythm and normal heart sounds.  No murmur heard. no BLE edema Pulmonary/Chest: Effort normal and breath sounds normal. No respiratory distress. no wheezes.  Neurological: he is alert and oriented to person, place, and time. No cranial nerve deficit. Coordination normal.  Psychiatric: he has a normal mood and affect. behavior is normal. Judgment and thought content normal.   Lab Results  Component Value Date   WBC 8.0 09/03/2012   HGB 14.3 09/03/2012  HCT 40.8 09/03/2012   PLT 238 09/03/2012   CHOL 107 04/23/2012   TRIG 68.0 04/23/2012   HDL 43.30 04/23/2012   LDLDIRECT 159.7 11/28/2009   ALT 32 09/03/2012   AST 24 09/03/2012   NA 140 09/03/2012   K 4.3 09/03/2012   CL 106 09/03/2012   CREATININE 1.1 09/03/2012   BUN 17.0 09/03/2012   CO2 25 09/03/2012   TSH 1.81 11/26/2007   INR 1.0 ratio 10/27/2009   HGBA1C 5.9 03/08/2009       Assessment & Plan:  See problem list. Medications and labs reviewed today.

## 2012-11-02 ENCOUNTER — Ambulatory Visit (INDEPENDENT_AMBULATORY_CARE_PROVIDER_SITE_OTHER): Payer: Medicare Other | Admitting: Internal Medicine

## 2012-11-02 ENCOUNTER — Ambulatory Visit (INDEPENDENT_AMBULATORY_CARE_PROVIDER_SITE_OTHER)
Admission: RE | Admit: 2012-11-02 | Discharge: 2012-11-02 | Disposition: A | Discharge status: Home / Self Care | Payer: Medicare Other | Source: Ambulatory Visit | Attending: Internal Medicine | Admitting: Internal Medicine

## 2012-11-02 ENCOUNTER — Encounter: Payer: Self-pay | Admitting: Internal Medicine

## 2012-11-02 VITALS — BP 142/78 | HR 102 | Temp 100.8°F | Ht 71.0 in | Wt 199.0 lb

## 2012-11-02 DIAGNOSIS — R062 Wheezing: Secondary | ICD-10-CM | POA: Diagnosis not present

## 2012-11-02 DIAGNOSIS — J45909 Unspecified asthma, uncomplicated: Secondary | ICD-10-CM

## 2012-11-02 DIAGNOSIS — R059 Cough, unspecified: Secondary | ICD-10-CM | POA: Diagnosis not present

## 2012-11-02 DIAGNOSIS — R0602 Shortness of breath: Secondary | ICD-10-CM | POA: Diagnosis not present

## 2012-11-02 DIAGNOSIS — R05 Cough: Secondary | ICD-10-CM | POA: Diagnosis not present

## 2012-11-02 MED ORDER — AZITHROMYCIN 250 MG PO TABS: ORAL_TABLET | ORAL | Status: DC

## 2012-11-02 MED ORDER — PREDNISONE (PAK) 10 MG PO TABS: 10.0000 mg | ORAL_TABLET | ORAL | Status: DC

## 2012-11-02 NOTE — Progress Notes (Signed)
  Subjective:    Patient ID: Caleb Gray, male    DOB: Mar 27, 1941, 71 y.o.   MRN: [id number]  Cough This is a new problem. The current episode started in the past 7 days. The problem has been gradually worsening. The problem occurs constantly. The cough is productive of sputum. Associated symptoms include headaches, a sore throat, shortness of breath and wheezing. Pertinent negatives include no ear pain, fever, hemoptysis, nasal congestion, postnasal drip or rhinorrhea. The symptoms are aggravated by cold air, exercise and lying down. He has tried a beta-agonist inhaler for the symptoms. The treatment provided mild relief. His past medical history is significant for asthma.   Past Medical History  Diagnosis Date  . CORONARY ARTERY DISEASE   . ANEMIA-NOS   . ASTHMA   . Colon cancer     [place] 1997 stage III T3, N1 s/p rectosigmoid resection  . HYPERLIPIDEMIA   . HYPERTENSION   . Chronic diastolic heart failure   . UNSPECIFIED PERIPHERAL VASCULAR DISEASE   . Low back pain 10/2009    s/p laminectomy/decompression L4-5  . Right kidney mass 02/2007    benign (oncocytoma) s/p resection/total nephrectomy     Review of Systems  Constitutional: Negative for fever.  HENT: Positive for sore throat. Negative for ear pain, rhinorrhea and postnasal drip.   Respiratory: Positive for cough, shortness of breath and wheezing. Negative for hemoptysis.   Neurological: Positive for headaches.       Objective:   Physical Exam BP 142/78  Pulse 102  Temp 100.8 F (38.2 C) (Oral)  Ht 5\' 11"  (1.803 m)  Wt 199 lb (90.266 kg)  BMI 27.75 kg/m2  SpO2 95% Wt Readings from Last 3 Encounters:  11/02/12 199 lb (90.266 kg)  10/21/12 199 lb 9.6 oz (90.538 kg)  09/16/12 197 lb (89.359 kg)   Constitutional:  He appears well-developed and well-nourished. No distress.  Neck: Normal range of motion. Neck supple. No JVD present. No thyromegaly present.  Cardiovascular: Normal rate, regular rhythm and  normal heart sounds.  No murmur heard. no BLE edema Pulmonary/Chest: Effort normal - right side breath sounds with wheeze. No respiratory distress.   Lab Results  Component Value Date   WBC 8.0 09/03/2012   HGB 14.3 09/03/2012   HCT 40.8 09/03/2012   PLT 238 09/03/2012   GLUCOSE 82 09/03/2012   CHOL 116 10/21/2012   TRIG 69.0 10/21/2012   HDL 38.40* 10/21/2012   LDLDIRECT 159.7 11/28/2009   LDLCALC 64 10/21/2012   ALT 32 09/03/2012   AST 24 09/03/2012   NA 140 09/03/2012   K 4.3 09/03/2012   CL 106 09/03/2012   CREATININE 1.1 09/03/2012   BUN 17.0 09/03/2012   CO2 25 09/03/2012   TSH 1.81 11/26/2007   INR 1.0 ratio 10/27/2009   HGBA1C 5.9 03/08/2009       Assessment & Plan:   Asthmatic bronchitis  Check CXR given R side pulm ausculation assymmetry, rule out pneumonia tx with 6d pred taper and empiric Zpak

## 2012-11-02 NOTE — Patient Instructions (Signed)
It was good to see you today. Zpak antibiotics and prescription pred taper - Your prescription(s) have been submitted to your pharmacy. Please take as directed and contact our office if you believe you are having problem(s) with the medication(s). Test(s) ordered today. Your results will be released to MyChart (or called to you) after review, usually within 72hours after test completion. If any changes need to be made, you will be notified at that same time. Alternate between ibuprofen and tylenol for aches, pain and fever symptoms as discussed Hydrate, rest and call if worse or unimproved

## 2013-01-06 ENCOUNTER — Other Ambulatory Visit: Payer: Self-pay | Admitting: Internal Medicine

## 2013-03-15 ENCOUNTER — Other Ambulatory Visit: Payer: Self-pay | Admitting: Internal Medicine

## 2013-05-05 ENCOUNTER — Encounter: Payer: Self-pay | Admitting: Internal Medicine

## 2013-05-05 ENCOUNTER — Other Ambulatory Visit (INDEPENDENT_AMBULATORY_CARE_PROVIDER_SITE_OTHER): Payer: Medicare Other

## 2013-05-05 ENCOUNTER — Ambulatory Visit (INDEPENDENT_AMBULATORY_CARE_PROVIDER_SITE_OTHER): Payer: Medicare Other | Admitting: Internal Medicine

## 2013-05-05 VITALS — BP 145/90 | HR 86 | Temp 98.2°F | Wt 198.4 lb

## 2013-05-05 DIAGNOSIS — E785 Hyperlipidemia, unspecified: Secondary | ICD-10-CM

## 2013-05-05 DIAGNOSIS — M545 Low back pain, unspecified: Secondary | ICD-10-CM

## 2013-05-05 DIAGNOSIS — Z0000 Encounter for general adult medical examination without abnormal findings: Secondary | ICD-10-CM

## 2013-05-05 DIAGNOSIS — I10 Essential (primary) hypertension: Secondary | ICD-10-CM

## 2013-05-05 LAB — BASIC METABOLIC PANEL
BUN: 15 mg/dL (ref 6–23)
Calcium: 9.6 mg/dL (ref 8.4–10.5)
Creatinine, Ser: 1.4 mg/dL (ref 0.4–1.5)
GFR: 54.78 mL/min — ABNORMAL LOW (ref >60.00)
Glucose, Bld: 109 mg/dL — ABNORMAL HIGH (ref 70–99)
Potassium: 4.5 mEq/L (ref 3.5–5.1)

## 2013-05-05 LAB — LIPID PANEL
Cholesterol: 126 mg/dL (ref 0–200)
HDL: 41.8 mg/dL (ref >39.00)
LDL Cholesterol: 64 mg/dL (ref 0–99)
Triglycerides: 101 mg/dL (ref 0.0–149.0)
VLDL: 20.2 mg/dL (ref 0.0–40.0)

## 2013-05-05 MED ORDER — IBUPROFEN 800 MG PO TABS: 800.0000 mg | ORAL_TABLET | Freq: Every day | ORAL | Status: DC

## 2013-05-05 NOTE — Assessment & Plan Note (Addendum)
The current medical regimen is generally effective;  continue present plan and medications.  Educated to avoid NSAIDs as pain will allow BP Readings from Last 3 Encounters:  05/05/13 168/94  11/02/12 142/78  10/21/12 144/82   Manual recheck 145/90 - reports home BP 120-140/<80s

## 2013-05-05 NOTE — Progress Notes (Signed)
Subjective:    Patient ID: Caleb Gray, male    DOB: 12/15/1940, 72 y.o.   MRN: [id number]  HPI   Here for medicare wellness  Diet: heart healthy Physical activity: sedentary to low level aerobic Depression/mood screen: negative Hearing: intact to whispered voice Visual acuity: grossly normal, performs annual eye exam  ADLs: capable Fall risk: none Home safety: good Cognitive evaluation: intact to orientation, naming, recall and repetition EOL planning: adv directives, full code/ I agree  I have personally reviewed and have noted 1. The patient's medical and social history 2. Their use of alcohol, tobacco or illicit drugs 3. Their current medications and supplements 4. The patient's functional ability including ADL's, fall risks, home safety risks and hearing or visual impairment. 5. Diet and physical activities 6. Evidence for depression or mood disorders  Also reviewed chronic medical issues today:  low back pain - ruptured disc hx 10/2009 - s/p decompression/laminectomy 10/2009 -  overall improved - mild residual pain, no weakness - plays golf without problems  ongoing PT for mild daily discomfort-  spasm and tight after prolonged car travel (or walking) across low back  - relieved with motrin  - requests refills - occ ESI with Dr. Retia Passe as needed  HTN - reports compliance with ongoing medical treatment and no changes in medication dose or frequency. denies adverse side effects related to current therapy. no chest pain or edema - "BP always up at the doc's office - white coat"   dyslipidemia - resumed lipitor 11/2009, increased dose 02/2011 as LDL>70 - reports 100% med compliance - denies weakness or muscle pain, no adverse side effects -   CAD hx - no angina - reports compliance with ongoing medical treatment and no changes in medication dose or frequency. denies adverse side effects related to current therapy. Follows with cards annually- 02/2009 echo with no wma and norm  lv ef   colon ca 1998 - s/p resection  prev followed annually with onc in [place] - est with local onc 07/2010  s/p est with [facility] 11/2010 - recommends 5year follow up 08/2013 (sooner if symptoms)  Past Medical History  Diagnosis Date  . CORONARY ARTERY DISEASE   . ANEMIA-NOS   . ASTHMA   . Colon cancer     [place] 1997 stage III T3, N1 s/p rectosigmoid resection  . HYPERLIPIDEMIA   . HYPERTENSION   . Chronic diastolic heart failure   . UNSPECIFIED PERIPHERAL VASCULAR DISEASE   . Low back pain 10/2009    s/p laminectomy/decompression L4-5  . Right kidney mass 02/2007    benign (oncocytoma) s/p resection/total nephrectomy   Family History  Problem Relation Age of Onset  . Colon cancer Mother   . Heart disease Father   . Alcohol abuse Other     Parents   History  Substance Use Topics  . Smoking status: Former Smoker    Types: Cigarettes    Quit date: 11/11/1968  . Smokeless tobacco: Not on file     Comment: Married, lives with wife, retired in 2007. Moved from [place] to [place] 04/2009  . Alcohol Use:     Review of Systems  Respiratory: Negative for cough and shortness of breath.   Cardiovascular: Negative for chest pain and leg swelling.  Neurological: Negative for weakness and headaches.  No other specific complaints in a complete review of systems (except as listed in HPI above).      Objective:   Physical Exam  BP 145/90  Pulse 86  Temp(Src) 98.2 F (36.8 C) (Oral)  Wt 198 lb 6.4 oz (89.994 kg)  BMI 27.68 kg/m2  SpO2 96% Wt Readings from Last 3 Encounters:  05/05/13 198 lb 6.4 oz (89.994 kg)  11/02/12 199 lb (90.266 kg)  10/21/12 199 lb 9.6 oz (90.538 kg)    Constitutional: he appears well-developed and well-nourished. No distress.  Neck: Normal range of motion. Neck supple. No JVD present. No thyromegaly present.  Cardiovascular: Normal rate, regular rhythm and normal heart sounds.  No murmur heard. No BLE edema. Pulmonary/Chest: Effort normal  and breath sounds normal. No respiratory distress. he has no wheezes.  Psychiatric: he has a normal mood and affect. behavior is normal. Judgment and thought content normal.  Lab Results  Component Value Date   WBC 8.0 09/03/2012   HGB 14.3 09/03/2012   HCT 40.8 09/03/2012   PLT 238 09/03/2012   CHOL 116 10/21/2012   TRIG 69.0 10/21/2012   HDL 38.40* 10/21/2012   LDLDIRECT 159.7 11/28/2009   ALT 32 09/03/2012   AST 24 09/03/2012   NA 140 09/03/2012   K 4.3 09/03/2012   CL 106 09/03/2012   CREATININE 1.1 09/03/2012   BUN 17.0 09/03/2012   CO2 25 09/03/2012   TSH 1.81 11/26/2007   INR 1.0 ratio 10/27/2009   HGBA1C 5.9 03/08/2009       Assessment & Plan:  AWV/v70.0 - Today patient counseled on age appropriate routine health concerns for screening and prevention, each reviewed and up to date or declined. Immunizations reviewed and up to date or declined. Labs/ECG reviewed. Risk factors for depression reviewed and negative. Hearing function and visual acuity are intact. ADLs screened and addressed as needed. Functional ability and level of safety reviewed and appropriate. Education, counseling and referrals performed based on assessed risks today. Patient provided with a copy of personalized plan for preventive services.  Also See problem list. Medications and labs reviewed today.

## 2013-05-05 NOTE — Assessment & Plan Note (Signed)
Lipitor increase 11/2010 from 10qd to 20qd to get LDL<70 Recheck now - adjust prn

## 2013-05-05 NOTE — Patient Instructions (Addendum)
It was good to see you today. Health Maintenance reviewed - all recommended immunizations and age-appropriate screenings are up-to-date or declined. We have reviewed your prior records including labs and tests today Test(s) ordered today. Your results will be released to MyChart (or called to you) after review, usually within 72hours after test completion. If any changes need to be made, you will be notified at that same time. Medications reviewed, no changes at this time. Refill on medication(s) as discussed today. Please schedule followup in 6 months, call sooner if problems.Health Maintenance, Males A healthy lifestyle and preventative care can promote health and wellness.  Maintain regular health, dental, and eye exams.  Eat a healthy diet. Foods like vegetables, fruits, whole grains, low-fat dairy products, and lean protein foods contain the nutrients you need without too many calories. Decrease your intake of foods high in solid fats, added sugars, and salt. Get information about a proper diet from your caregiver, if necessary.  Regular physical exercise is one of the most important things you can do for your health. Most adults should get at least 150 minutes of moderate-intensity exercise (any activity that increases your heart rate and causes you to sweat) each week. In addition, most adults need muscle-strengthening exercises on 2 or more days a week.   Maintain a healthy weight. The body mass index (BMI) is a screening tool to identify possible weight problems. It provides an estimate of body fat based on height and weight. Your caregiver can help determine your BMI, and can help you achieve or maintain a healthy weight. For adults 20 years and older:  A BMI below 18.5 is considered underweight.  A BMI of 18.5 to 24.9 is normal.  A BMI of 25 to 29.9 is considered overweight.  A BMI of 30 and above is considered obese.  Maintain normal blood lipids and cholesterol by exercising  and minimizing your intake of saturated fat. Eat a balanced diet with plenty of fruits and vegetables. Blood tests for lipids and cholesterol should begin at age 57 and be repeated every 5 years. If your lipid or cholesterol levels are high, you are over 50, or you are a high risk for heart disease, you may need your cholesterol levels checked more frequently.Ongoing high lipid and cholesterol levels should be treated with medicines, if diet and exercise are not effective.  If you smoke, find out from your caregiver how to quit. If you do not use tobacco, do not start.  If you choose to drink alcohol, do not exceed 2 drinks per day. One drink is considered to be 12 ounces (355 mL) of beer, 5 ounces (148 mL) of wine, or 1.5 ounces (44 mL) of liquor.  Avoid use of street drugs. Do not share needles with anyone. Ask for help if you need support or instructions about stopping the use of drugs.  High blood pressure causes heart disease and increases the risk of stroke. Blood pressure should be checked at least every 1 to 2 years. Ongoing high blood pressure should be treated with medicines if weight loss and exercise are not effective.  If you are 38 to 72 years old, ask your caregiver if you should take aspirin to prevent heart disease.  Diabetes screening involves taking a blood sample to check your fasting blood sugar level. This should be done once every 3 years, after age 11, if you are within normal weight and without risk factors for diabetes. Testing should be considered at a younger age or  be carried out more frequently if you are overweight and have at least 1 risk factor for diabetes.  Colorectal cancer can be detected and often prevented. Most routine colorectal cancer screening begins at the age of 12 and continues through age 103. However, your caregiver may recommend screening at an earlier age if you have risk factors for colon cancer. On a yearly basis, your caregiver may provide home test  kits to check for hidden blood in the stool. Use of a small camera at the end of a tube, to directly examine the colon (sigmoidoscopy or colonoscopy), can detect the earliest forms of colorectal cancer. Talk to your caregiver about this at age 62, when routine screening begins. Direct examination of the colon should be repeated every 5 to 10 years through age 72, unless early forms of pre-cancerous polyps or small growths are found.  Hepatitis C blood testing is recommended for all people born from [birth year] through 1965 and any individual with known risks for hepatitis C.  Healthy men should no longer receive prostate-specific antigen (PSA) blood tests as part of routine cancer screening. Consult with your caregiver about prostate cancer screening.  Testicular cancer screening is not recommended for adolescents or adult males who have no symptoms. Screening includes self-exam, caregiver exam, and other screening tests. Consult with your caregiver about any symptoms you have or any concerns you have about testicular cancer.  Practice safe sex. Use condoms and avoid high-risk sexual practices to reduce the spread of sexually transmitted infections (STIs).  Use sunscreen with a sun protection factor (SPF) of 30 or greater. Apply sunscreen liberally and repeatedly throughout the day. You should seek shade when your shadow is shorter than you. Protect yourself by wearing long sleeves, pants, a wide-brimmed hat, and sunglasses year round, whenever you are outdoors.  Notify your caregiver of new moles or changes in moles, especially if there is a change in shape or color. Also notify your caregiver if a mole is larger than the size of a pencil eraser.  A one-time screening for abdominal aortic aneurysm (AAA) and surgical repair of large AAAs by sound wave imaging (ultrasonography) is recommended for ages 86 to 55 years who are current or former smokers.  Stay current with your immunizations. Document Released:  04/25/2008 Document Revised: 01/20/2012 Document Reviewed: 03/25/2011 [facility] Patient Information 2014 [place], [place].

## 2013-05-05 NOTE — Assessment & Plan Note (Signed)
S/p L4-5 laminectomy/decompression 10/2009 - mild intermittent residual back issues but initial leg pain resolved s/p eval/tx by PMR and PT ([facility]) - continue same as needed Alternate tylenol and NSAIDs as ongoing for pain relief as needed

## 2013-05-12 DIAGNOSIS — D3000 Benign neoplasm of unspecified kidney: Secondary | ICD-10-CM | POA: Diagnosis not present

## 2013-05-12 DIAGNOSIS — N281 Cyst of kidney, acquired: Secondary | ICD-10-CM | POA: Diagnosis not present

## 2013-05-24 ENCOUNTER — Encounter: Payer: Self-pay | Admitting: Internal Medicine

## 2013-05-24 ENCOUNTER — Ambulatory Visit (INDEPENDENT_AMBULATORY_CARE_PROVIDER_SITE_OTHER): Payer: Medicare Other | Admitting: Internal Medicine

## 2013-05-24 VITALS — BP 142/70 | HR 106 | Temp 99.7°F | Wt 200.4 lb

## 2013-05-24 DIAGNOSIS — J069 Acute upper respiratory infection, unspecified: Secondary | ICD-10-CM

## 2013-05-24 MED ORDER — IPRATROPIUM BROMIDE 0.03 % NA SOLN: 2.0000 | Freq: Three times a day (TID) | NASAL | Status: DC

## 2013-05-24 MED ORDER — HYDROCOD POLST-CHLORPHEN POLST 10-8 MG/5ML PO LQCR: 5.0000 mL | Freq: Two times a day (BID) | ORAL | Status: DC | PRN

## 2013-05-24 NOTE — Patient Instructions (Signed)
It was good to see you today. If you develop worsening symptoms or fever, call and we can reconsider antibiotics, but it does not appear necessary to use antibiotics at this time. Atrovent nasal spray and prescription cough syrup - Your prescription(s) have been submitted to your pharmacy. Please take as directed and contact our office if you believe you are having problem(s) with the medication(s). Alternate between ibuprofen and tylenol for aches, pain and fever symptoms as discussed Hydrate, rest and call if worse or unimproved

## 2013-05-24 NOTE — Progress Notes (Signed)
  Subjective:    HPI  complains of head cold symptoms, ?sinusitus Onset >1 week ago, initially improved then relapsing and worse symptoms  First associated with rhinorrhea, sneezing, sore throat, mild headache and low grade fever Now sinus pressure and mild-mod nasal congestion, clear discharge No relief with OTC meds Precipitated by sick contacts and weather change  Past Medical History  Diagnosis Date  . CORONARY ARTERY DISEASE   . ANEMIA-NOS   . ASTHMA   . Colon cancer     [place] 1997 stage III T3, N1 s/p rectosigmoid resection  . HYPERLIPIDEMIA   . HYPERTENSION   . Chronic diastolic heart failure   . UNSPECIFIED PERIPHERAL VASCULAR DISEASE   . Low back pain 10/2009    s/p laminectomy/decompression L4-5  . Right kidney mass 02/2007    benign (oncocytoma) s/p resection/total nephrectomy    Review of Systems Constitutional: No night sweats, no unexpected weight change Pulmonary: No pleurisy or hemoptysis Cardiovascular: No chest pain or palpitations     Objective:   Physical Exam BP 142/70  Pulse 106  Temp(Src) 99.7 F (37.6 C) (Oral)  Wt 200 lb 6.4 oz (90.901 kg)  BMI 27.96 kg/m2  SpO2 97% GEN: mildly ill appearing and audible head/chest congestion HENT: NCAT, no sinus tenderness bilaterally, nares with thin clear discharge and turbinate swelling, oropharynx mild erythema and PND, no exudate; TMs with clear effusion, no erythema or injection Eyes: Vision grossly intact, mild right greater than left conjunctivitis Lungs: Clear to auscultation without rhonchi or wheeze, no increased work of breathing Cardiovascular: Regular rate and rhythm, no bilateral edema  Lab Results  Component Value Date   WBC 8.0 09/03/2012   HGB 14.3 09/03/2012   HCT 40.8 09/03/2012   PLT 238 09/03/2012   GLUCOSE 109* 05/05/2013   CHOL 126 05/05/2013   TRIG 101.0 05/05/2013   HDL 41.80 05/05/2013   LDLDIRECT 159.7 11/28/2009   LDLCALC 64 05/05/2013   ALT 32 09/03/2012   AST 24  09/03/2012   NA 139 05/05/2013   K 4.5 05/05/2013   CL 106 05/05/2013   CREATININE 1.4 05/05/2013   BUN 15 05/05/2013   CO2 25 05/05/2013   TSH 1.81 11/26/2007   INR 1.0 ratio 10/27/2009   HGBA1C 5.9 03/08/2009      Assessment & Plan:  Viral URI > associated with mild conjunctivitis and sinusitis Cough, postnasal drip related to above   Advised against empiric antibiotic at this time, the patient educated on "red flags" for which to call if symptoms worse or unimproved Prescription cough suppression and nasal Atrovent for rhinorrhea - new prescriptions done Symptomatic care with Tylenol or Advil, decongestants, antihistamine, hydration and rest -  Saline irrigation and salt gargle advised as needed

## 2013-06-21 ENCOUNTER — Other Ambulatory Visit: Payer: Self-pay | Admitting: *Deleted

## 2013-06-21 MED ORDER — ATORVASTATIN CALCIUM 20 MG PO TABS: 20.0000 mg | ORAL_TABLET | Freq: Every day | ORAL | Status: DC

## 2013-06-21 MED ORDER — ENALAPRIL MALEATE 20 MG PO TABS: 20.0000 mg | ORAL_TABLET | Freq: Every day | ORAL | Status: DC

## 2013-07-05 ENCOUNTER — Encounter: Payer: Self-pay | Admitting: Internal Medicine

## 2013-08-02 ENCOUNTER — Other Ambulatory Visit: Payer: Self-pay | Admitting: Internal Medicine

## 2013-08-10 DIAGNOSIS — Z23 Encounter for immunization: Secondary | ICD-10-CM | POA: Diagnosis not present

## 2013-09-01 ENCOUNTER — Ambulatory Visit (AMBULATORY_SURGERY_CENTER): Payer: Self-pay

## 2013-09-01 VITALS — Ht 71.0 in | Wt 195.0 lb

## 2013-09-01 DIAGNOSIS — Z800 Family history of malignant neoplasm of digestive organs: Secondary | ICD-10-CM

## 2013-09-01 MED ORDER — MOVIPREP 100 G PO SOLR: 1.0000 | Freq: Once | ORAL | Status: DC

## 2013-09-02 ENCOUNTER — Telehealth: Payer: Self-pay | Admitting: Oncology

## 2013-09-02 ENCOUNTER — Other Ambulatory Visit (HOSPITAL_BASED_OUTPATIENT_CLINIC_OR_DEPARTMENT_OTHER): Payer: Medicare Other | Admitting: Lab

## 2013-09-02 ENCOUNTER — Ambulatory Visit (HOSPITAL_BASED_OUTPATIENT_CLINIC_OR_DEPARTMENT_OTHER): Payer: Medicare Other | Admitting: Oncology

## 2013-09-02 VITALS — BP 173/94 | HR 85 | Temp 98.7°F | Resp 20 | Wt 200.0 lb

## 2013-09-02 DIAGNOSIS — D649 Anemia, unspecified: Secondary | ICD-10-CM

## 2013-09-02 DIAGNOSIS — C649 Malignant neoplasm of unspecified kidney, except renal pelvis: Secondary | ICD-10-CM

## 2013-09-02 DIAGNOSIS — Z85038 Personal history of other malignant neoplasm of large intestine: Secondary | ICD-10-CM

## 2013-09-02 DIAGNOSIS — C189 Malignant neoplasm of colon, unspecified: Secondary | ICD-10-CM

## 2013-09-02 LAB — CBC WITH DIFFERENTIAL/PLATELET
BASO%: 1.2 % (ref 0.0–2.0)
EOS%: 6.1 % (ref 0.0–7.0)
HCT: 39.9 % (ref 38.4–49.9)
MCH: 32.3 pg (ref 27.2–33.4)
MCHC: 35 g/dL (ref 32.0–36.0)
MONO#: 0.8 10*3/uL (ref 0.1–0.9)
NEUT%: 56.8 % (ref 39.0–75.0)
RBC: 4.32 10*6/uL (ref 4.20–5.82)
RDW: 13.9 % (ref 11.0–14.6)
WBC: 8.3 10*3/uL (ref 4.0–10.3)
lymph#: 2.1 10*3/uL (ref 0.9–3.3)

## 2013-09-02 LAB — COMPREHENSIVE METABOLIC PANEL (CC13)
Albumin: 3.5 g/dL (ref 3.5–5.0)
Alkaline Phosphatase: 78 U/L (ref 40–150)
Anion Gap: 9 mEq/L (ref 3–11)
BUN: 15.1 mg/dL (ref 7.0–26.0)
CO2: 25 mEq/L (ref 22–29)
Calcium: 9.5 mg/dL (ref 8.4–10.4)
Chloride: 108 mEq/L (ref 98–109)
Glucose: 87 mg/dl (ref 70–140)
Potassium: 4.5 mEq/L (ref 3.5–5.1)
Sodium: 141 mEq/L (ref 136–145)
Total Protein: 6.8 g/dL (ref 6.4–8.3)

## 2013-09-02 NOTE — Telephone Encounter (Signed)
gv and printed appt sched and avs for pt for OCT 2015  °

## 2013-09-02 NOTE — Progress Notes (Signed)
Hematology and Oncology Follow Up Visit  Caleb Gray [id number] 04-02-1941 72 y.o. 09/02/2013 1:23 PM  CC: Caleb Ports A. Felicity Coyer, MD  Caleb Gray. Caleb Gray, M.D.    Principle Diagnosis:  This is a 72 year old gentleman with the following diagnoses: 1. History of T3 N1 colon cancer diagnosed in 1997, status post rectosigmoid resection, adjuvant chemotherapy with 5-FU without any evidence to suggest recurrent disease. 2.     Oncocytic tumor of the kidney, status post nephrectomy of the right kidney.  No evidence of any recurrent disease since 2008.  Current therapy: Observation and surveillance.  Interim History: Caleb Gray presents today for a followup visit.  He is a very pleasant gentleman with the above history. His previous treatment was done in [place], predominantly his nephrectomy as well as colon resection.  He established care with me in October 2011.  Since the last time I saw him, he had not reported any major problems, had not reported any chest pain, had not reported any difficulty breathing, had not reported any abdominal pain or urinary symptoms.  For the most part, activity level remains about the same. No new complaints today. He is scheduled for a colonoscopy in 2 weeks.   Medications: I have reviewed the patient's current medications. Current Outpatient Prescriptions  Medication Sig Dispense Refill  . albuterol (PROAIR HFA) 108 (90 BASE) MCG/ACT inhaler Inhale 2 puffs into the lungs 4 (four) times daily as needed.  72 g  0  . amLODipine (NORVASC) 5 MG tablet Take 1 tablet (5 mg total) by mouth daily.  90 tablet  3  . aspirin 81 MG tablet Take 81 mg by mouth daily.        Marland Kitchen atorvastatin (LIPITOR) 20 MG tablet Take 1 tablet (20 mg total) by mouth at bedtime.  90 tablet  3  . enalapril (VASOTEC) 20 MG tablet Take 1 tablet (20 mg total) by mouth daily.  90 tablet  3  . ibuprofen (ADVIL,MOTRIN) 800 MG tablet TAKE 1 TABLET BY MOUTH EVERY DAY  90 tablet  0  . MOVIPREP 100 G SOLR  Take 1 kit (200 g total) by mouth once.  1 kit  0  . Multiple Vitamins-Minerals (CENTRUM SILVER) tablet Take 1 tablet by mouth daily.        . Omega-3 Fatty Acids (FISH OIL) 1000 MG CAPS Take by mouth 2 (two) times daily.        No current facility-administered medications for this visit.    Allergies:  Allergies  Allergen Reactions  . Penicillins     Past Medical History, Surgical history, Social history, and Family History were reviewed and updated.  Review of Systems:no chest pain or dyspnea on exertion  Remaining ROS negative. Physical Exam: Blood pressure 173/94, pulse 85, temperature 98.7 F (37.1 C), temperature source Oral, resp. rate 20, weight 200 lb (90.719 kg). ECOG: 0 General appearance: alert Head: Normocephalic, without obvious abnormality, atraumatic Neck: no adenopathy, no carotid bruit, no JVD, supple, symmetrical, trachea midline and thyroid not enlarged, symmetric, no tenderness/mass/nodules Lymph nodes: Cervical, supraclavicular, and axillary nodes normal. Heart:regular rate and rhythm, S1, S2 normal, no murmur, click, rub or gallop Lung:chest clear, no wheezing, rales, normal symmetric air entry Abdomin: soft, non-tender, without masses or organomegaly EXT:no evidence of joint effusion   Lab Results: Lab Results  Component Value Date   WBC 8.3 09/02/2013   HGB 14.0 09/02/2013   HCT 39.9 09/02/2013   MCV 92.3 09/02/2013   PLT 228 09/02/2013  Impression and Plan:  This is a pleasant 72 year-old gentleman with the following issues: 1. Colon cancer diagnosed in 1997.  He had stage III disease with no evidence to suggest recurrent disease.  He is scheduled for a colonoscopy in 2 weeks. 2. Oncocytic tumor of the kidney, status post nephrectomy.  No evidence to suggest recurrent disease.  Continue on active surveillance.  I will schedule him for followup every 12 months.    ,, MD10/23/20141:23 PM

## 2013-09-16 ENCOUNTER — Other Ambulatory Visit: Payer: Self-pay

## 2013-09-16 ENCOUNTER — Encounter: Payer: Self-pay | Admitting: Internal Medicine

## 2013-09-16 ENCOUNTER — Ambulatory Visit (AMBULATORY_SURGERY_CENTER): Payer: Medicare Other | Admitting: Internal Medicine

## 2013-09-16 ENCOUNTER — Other Ambulatory Visit: Payer: Self-pay | Admitting: Internal Medicine

## 2013-09-16 VITALS — BP 155/85 | HR 78 | Temp 97.1°F | Resp 27 | Ht 71.0 in | Wt 195.0 lb

## 2013-09-16 DIAGNOSIS — I10 Essential (primary) hypertension: Secondary | ICD-10-CM | POA: Diagnosis not present

## 2013-09-16 DIAGNOSIS — Z800 Family history of malignant neoplasm of digestive organs: Secondary | ICD-10-CM

## 2013-09-16 DIAGNOSIS — Z86010 Personal history of colonic polyps: Secondary | ICD-10-CM

## 2013-09-16 DIAGNOSIS — D126 Benign neoplasm of colon, unspecified: Secondary | ICD-10-CM | POA: Diagnosis not present

## 2013-09-16 DIAGNOSIS — Z85038 Personal history of other malignant neoplasm of large intestine: Secondary | ICD-10-CM

## 2013-09-16 DIAGNOSIS — I5032 Chronic diastolic (congestive) heart failure: Secondary | ICD-10-CM | POA: Diagnosis not present

## 2013-09-16 DIAGNOSIS — Z9049 Acquired absence of other specified parts of digestive tract: Secondary | ICD-10-CM | POA: Diagnosis not present

## 2013-09-16 DIAGNOSIS — I2510 Atherosclerotic heart disease of native coronary artery without angina pectoris: Secondary | ICD-10-CM | POA: Diagnosis not present

## 2013-09-16 DIAGNOSIS — J45909 Unspecified asthma, uncomplicated: Secondary | ICD-10-CM | POA: Diagnosis not present

## 2013-09-16 MED ORDER — SODIUM CHLORIDE 0.9 % IV SOLN: 500.0000 mL | INTRAVENOUS | Status: DC

## 2013-09-16 NOTE — Progress Notes (Signed)
Pt states, "my blood pressure always goes up at the doctor's office.  I didn't take my BP meds today."

## 2013-09-16 NOTE — Progress Notes (Signed)
Patient did not experience any of the following events: a burn prior to discharge; a fall within the facility; wrong site/side/patient/procedure/implant event; or a hospital transfer or hospital admission upon discharge from the facility. (G8907) Patient did not have preoperative order for IV antibiotic SSI prophylaxis. (G8918)  

## 2013-09-16 NOTE — Patient Instructions (Signed)
YOU HAD AN ENDOSCOPIC PROCEDURE TODAY AT THE [facility]: Refer to the procedure report that was given to you for any specific questions about what was found during the examination.  If the procedure report does not answer your questions, please call your gastroenterologist to clarify.  If you requested that your care partner not be given the details of your procedure findings, then the procedure report has been included in a sealed envelope for you to review at your convenience later.  YOU SHOULD EXPECT: Some feelings of bloating in the abdomen. Passage of more gas than usual.  Walking can help get rid of the air that was put into your GI tract during the procedure and reduce the bloating. If you had a lower endoscopy (such as a colonoscopy or flexible sigmoidoscopy) you may notice spotting of blood in your stool or on the toilet paper. If you underwent a bowel prep for your procedure, then you may not have a normal bowel movement for a few days.  DIET: Your first meal following the procedure should be a light meal and then it is ok to progress to your normal diet.  A half-sandwich or bowl of soup is an example of a good first meal.  Heavy or fried foods are harder to digest and may make you feel nauseous or bloated.  Likewise meals heavy in dairy and vegetables can cause extra gas to form and this can also increase the bloating.  Drink plenty of fluids but you should avoid alcoholic beverages for 24 hours.  ACTIVITY: Your care partner should take you home directly after the procedure.  You should plan to take it easy, moving slowly for the rest of the day.  You can resume normal activity the day after the procedure however you should NOT DRIVE or use heavy machinery for 24 hours (because of the sedation medicines used during the test).    SYMPTOMS TO REPORT IMMEDIATELY: A gastroenterologist can be reached at any hour.  During normal business hours, 8:30 AM to 5:00 PM Monday through Friday,  call [phone].  After hours and on weekends, please call the GI answering service at [phone] who will take a message and have the physician on call contact you.   Following lower endoscopy (colonoscopy or flexible sigmoidoscopy):  Excessive amounts of blood in the stool  Significant tenderness or worsening of abdominal pains  Swelling of the abdomen that is new, acute  Fever of 100F or higher    FOLLOW UP: If any biopsies were taken you will be contacted by phone or by letter within the next 1-3 weeks.  Call your gastroenterologist if you have not heard about the biopsies in 3 weeks.  Our staff will call the home number listed on your records the next business day following your procedure to check on you and address any questions or concerns that you may have at that time regarding the information given to you following your procedure. This is a courtesy call and so if there is no answer at the home number and we have not heard from you through the emergency physician on call, we will assume that you have returned to your regular daily activities without incident.  SIGNATURES/CONFIDENTIALITY: You and/or your care partner have signed paperwork which will be entered into your electronic medical record.  These signatures attest to the fact that that the information above on your After Visit Summary has been reviewed and is understood.  Full responsibility of the confidentiality   of this discharge information lies with you and/or your care-partner.    Polyp and diverticulosis information given.    Follow-up colonoscopy in 5 years.,

## 2013-09-16 NOTE — Op Note (Signed)
[facility] [address] [place], [address]   COLONOSCOPY PROCEDURE REPORT  PATIENT: Caleb, Gray  MR#: [id number] BIRTHDATE: Mar 31, 1941 , 72  yrs. old GENDER: Male ENDOSCOPIST: Roxy Cedar, MD REFERRED BM:[id number] Program Recall PROCEDURE DATE:  09/16/2013 PROCEDURE:   Colonoscopy with snare polypectomy x 2 First Screening Colonoscopy - Avg.  risk and is 50 yrs.  old or older - No.  Prior Negative Screening - Now for repeat screening. N/A  History of Adenoma - Now for follow-up colonoscopy & has been > or = to 3 yrs.  Yes hx of adenoma.  Has been 3 or more years since last colonoscopy.  Polyps Removed Today? Yes. ASA CLASS:   Class II INDICATIONS:High risk patient with personal history of colon cancer (1997 T3,N1 - elsewhere)  and Patient's personal history of adenomatous colon polyps.- Last exam 08-2008 (negative) MEDICATIONS: MAC sedation, administered by CRNA and propofol (Diprivan) 200mg  IV  DESCRIPTION OF PROCEDURE:   After the risks benefits and alternatives of the procedure were thoroughly explained, informed consent was obtained.  A digital rectal exam revealed no abnormalities of the rectum.   The LB [id number]  endoscope was introduced through the anus and advanced to the cecum, which was identified by both the appendix and ileocecal valve. No adverse events experienced.   The quality of the prep was good, using MoviPrep  The instrument was then slowly withdrawn as the colon was fully examined.  COLON FINDINGS: Two diminutive polyps were found in the transverse colon.  A polypectomy was performed with a cold snare.  The resection was complete and the polyp tissue was completely retrieved.   Moderate diverticulosis was noted The finding was in the left colon.   There was evidence of a prior colo-colonic surgical anastomosis in the sigmoid colon.   The colon mucosa was otherwise normal.  Retroflexed views revealed internal  hemorrhoids. The time to cecum=2 minutes 46 seconds.  Withdrawal time=14 minutes 41 seconds.  The scope was withdrawn and the procedure completed. COMPLICATIONS: There were no complications.  ENDOSCOPIC IMPRESSION: 1.   Two diminutive polyps were found in the transverse colon; polypectomy was performed with a cold snare 2.   Moderate diverticulosis was noted in the left colon 3.   There was evidence of a prior colo-colonic surgical anastomosis in the sigmoid colon  RECOMMENDATIONS: 1. Follow up colonoscopy in 5 years   eSigned:  Roxy Cedar, MD 09/16/2013 8:50 AM   cc: Newt Lukes, MD and The Patient   PATIENT NAME:  Caleb, Gray MR#: [id number]

## 2013-09-16 NOTE — Progress Notes (Signed)
Called to room to assist during endoscopic procedure.  Patient ID and intended procedure confirmed with present staff. Received instructions for my participation in the procedure from the performing physician. ewm 

## 2013-09-17 ENCOUNTER — Telehealth: Payer: Self-pay | Admitting: *Deleted

## 2013-09-17 NOTE — Telephone Encounter (Signed)
  Follow up Call-  Call back number 09/16/2013  Post procedure Call Back phone  # [phone]  Permission to leave phone message Yes     Patient questions:  Do you have a fever, pain , or abdominal swelling? no Pain Score  0 *  Have you tolerated food without any problems? yes  Have you been able to return to your normal activities? yes  Do you have any questions about your discharge instructions: Diet   no Medications  no Follow up visit  no  Do you have questions or concerns about your Care? no  Actions: * If pain score is 4 or above: No action needed, pain <4.

## 2013-09-20 ENCOUNTER — Encounter: Payer: Self-pay | Admitting: Internal Medicine

## 2013-09-22 ENCOUNTER — Encounter: Payer: Self-pay | Admitting: Internal Medicine

## 2013-09-23 ENCOUNTER — Encounter: Payer: Self-pay | Admitting: Cardiology

## 2013-09-23 ENCOUNTER — Ambulatory Visit (INDEPENDENT_AMBULATORY_CARE_PROVIDER_SITE_OTHER): Payer: Medicare Other | Admitting: Cardiology

## 2013-09-23 VITALS — BP 160/88 | HR 62 | Ht 71.0 in | Wt 198.0 lb

## 2013-09-23 DIAGNOSIS — E785 Hyperlipidemia, unspecified: Secondary | ICD-10-CM | POA: Diagnosis not present

## 2013-09-23 DIAGNOSIS — I2510 Atherosclerotic heart disease of native coronary artery without angina pectoris: Secondary | ICD-10-CM | POA: Diagnosis not present

## 2013-09-23 DIAGNOSIS — I10 Essential (primary) hypertension: Secondary | ICD-10-CM | POA: Diagnosis not present

## 2013-09-23 MED ORDER — AMLODIPINE BESYLATE 10 MG PO TABS: 10.0000 mg | ORAL_TABLET | Freq: Every day | ORAL | Status: DC

## 2013-09-23 NOTE — Patient Instructions (Signed)
Increase amlodipine to 10mg  daily.    Your physician wants you to follow-up in: 1 year with Dr Shirlee Latch. (November 2015).  You will receive a reminder letter in the mail two months in advance. If you don't receive a letter, please call our office to schedule the follow-up appointment.

## 2013-09-24 NOTE — Progress Notes (Signed)
Patient ID: Caleb Gray, male   DOB: 1941/06/12, 72 y.o.   MRN: [id number] PCP: Dr. Felicity Coyer  72 yo with history of CAD, colon cancer, HTN, and right nephrectomy presents for cardiology followup. Patient had pre-op testing before nephrectomy in 2008. Stress test was suggestive of coronary disease. He had never had chest pain or exertional dyspnea. Cardiac cath was done showing 70% mid LAD and 90% distal RCA. Given lack of symptoms, he was managed medically. Since that time, he has had no cardiac problems. He had a low risk myoview in 2009. He still has never had chest pain. He walks for 2 miles on occasion for exercise and golfs with no exertional dyspnea. He and his wife spent 2 weeks in [place] recently.  Echo in 4/11 showed preserved LV systolic function and moderate diastolic dysfunction.  No claudication.  BP has begun to run a bit higher, up to the 150s and 160/88 today.     Labs (1/11): creatinine 1.4, K 4.5, HDL 37, LDL 160 (off Lipitor)  Labs (5/11): LDL 76, HDL 41  Labs (9/11): LDL 82, HDL 33.7  Labs (6/12): LDL 72, HDL 44 Labs (10/12): K 4.2, creatinine 1.34 Labs (6/13): LDL 50, HDL 43 Labs (10/13): K 4.3, creatinine 1.1, HCT 40.8 Labs (6/14): LDL 64, HDL 42 Labs (10/14): K 4.5, creatinine 1.4  ECG: NSR, borderline inferior Qs  SH: Married, retired, lives in [place], nonsmoker.   FH: CAD  ROS: All systems reviewed and negative except as per HPI.   Allergies (verified):  1) ! Penicillin   Past Medical History:  1. Colon cancer - adenoca 1997 s/p rectosigmoid resection.  2. Coronary artery disease - 2 vessel disease by cath in 4/08 in [place]: 70% mid LAD, 90% dist RCA managed medically; LVEF 55%. ETT-myoview 8/09 - fixed deficit at basal inferior segment (small), low risk. Echo (4/11): EF 55-60%, no regional wall motion abnormalities, moderate diastolic dysfunction, normal RV and normal PA pressure.  3. PAD - Abnormal ABIs in 9/09 per patient. He says a further workup  was done (he is not sure what) and he was told things were "ok"  4. Diverticulitis, hx of  5. Hyperlipidemia  6. Hypertension  7. 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  8. Asthma  9. Low back pain s/p ruptured disc, had spine surgery in 12/10.   Current Outpatient Prescriptions  Medication Sig Dispense Refill  . albuterol (PROAIR HFA) 108 (90 BASE) MCG/ACT inhaler Inhale 2 puffs into the lungs 4 (four) times daily as needed.  72 g  0  . amLODipine (NORVASC) 10 MG tablet Take 1 tablet (10 mg total) by mouth daily.  90 tablet  3  . aspirin 81 MG tablet Take 81 mg by mouth daily.        Marland Kitchen atorvastatin (LIPITOR) 20 MG tablet Take 1 tablet (20 mg total) by mouth at bedtime.  90 tablet  3  . enalapril (VASOTEC) 20 MG tablet Take 1 tablet (20 mg total) by mouth daily.  90 tablet  3  . ibuprofen (ADVIL,MOTRIN) 800 MG tablet TAKE 1 TABLET BY MOUTH EVERY DAY  90 tablet  0  . Multiple Vitamins-Minerals (CENTRUM SILVER) tablet Take 1 tablet by mouth daily.        . Omega-3 Fatty Acids (FISH OIL) 1000 MG CAPS Take by mouth 2 (two) times daily.        No current facility-administered medications for this visit.    BP 160/88  Pulse  62  Ht 5\' 11"  (1.803 m)  Wt 198 lb (89.812 kg)  BMI 27.63 kg/m2 General: NAD Neck: No JVD, no thyromegaly or thyroid nodule.  Lungs: Clear to auscultation bilaterally with normal respiratory effort. CV: Nondisplaced PMI.  Heart regular S1/S2, +S4, no murmur.  No peripheral edema.  No carotid bruit.  Normal pedal pulses.  Abdomen: Soft, nontender, no hepatosplenomegaly, no distention.  Neurologic: Alert and oriented x 3.  Psych: Normal affect. Extremities: No clubbing or cyanosis.   Assessment/Plan:   HYPERLIPIDEMIA LDL was at goal (< 70) when last checked.  CORONARY ARTERY DISEASE  Stable with no ischemic symptoms. His ECG shows inferior Qs. It is possible that his distal RCA occluded (90% stenosis on prior cath) but he has had no symptoms  consistent with this. Continue ASA, statin, enalapril.  HYPERTENSION  BP is running higher, increase amlodipine to 10 mg daily.    Marca Ancona 09/24/2013

## 2013-10-12 NOTE — Addendum Note (Signed)
Addended by: Maple Hudson on: 10/12/2013 12:28 PM   Modules accepted: Level of Service

## 2013-10-20 ENCOUNTER — Encounter: Payer: Self-pay | Admitting: Internal Medicine

## 2013-10-20 ENCOUNTER — Other Ambulatory Visit (INDEPENDENT_AMBULATORY_CARE_PROVIDER_SITE_OTHER): Payer: Medicare Other

## 2013-10-20 ENCOUNTER — Ambulatory Visit (INDEPENDENT_AMBULATORY_CARE_PROVIDER_SITE_OTHER): Payer: Medicare Other | Admitting: Internal Medicine

## 2013-10-20 VITALS — BP 142/84 | HR 81 | Temp 98.4°F | Wt 198.8 lb

## 2013-10-20 DIAGNOSIS — I10 Essential (primary) hypertension: Secondary | ICD-10-CM | POA: Diagnosis not present

## 2013-10-20 DIAGNOSIS — E785 Hyperlipidemia, unspecified: Secondary | ICD-10-CM

## 2013-10-20 DIAGNOSIS — J45909 Unspecified asthma, uncomplicated: Secondary | ICD-10-CM | POA: Diagnosis not present

## 2013-10-20 DIAGNOSIS — Z23 Encounter for immunization: Secondary | ICD-10-CM

## 2013-10-20 DIAGNOSIS — I2510 Atherosclerotic heart disease of native coronary artery without angina pectoris: Secondary | ICD-10-CM | POA: Diagnosis not present

## 2013-10-20 LAB — LIPID PANEL
Cholesterol: 119 mg/dL (ref 0–200)
HDL: 46.7 mg/dL (ref >39.00)
LDL Cholesterol: 61 mg/dL (ref 0–99)
VLDL: 11.2 mg/dL (ref 0.0–40.0)

## 2013-10-20 MED ORDER — ALBUTEROL SULFATE HFA 108 (90 BASE) MCG/ACT IN AERS: 2.0000 | INHALATION_SPRAY | Freq: Four times a day (QID) | RESPIRATORY_TRACT | Status: DC | PRN

## 2013-10-20 NOTE — Progress Notes (Signed)
Pre-visit discussion using our clinic review tool. No additional management support is needed unless otherwise documented below in the visit note.  

## 2013-10-20 NOTE — Patient Instructions (Addendum)
It was good to see you today.  We have reviewed your prior records including labs and tests today  Your pneumonia vaccine was given and/or updated today. Because of asthma, you should have this vaccination every 5 years  Medications reviewed and updated, no changes recommended at this time.  Test(s) ordered today. Your results will be released to MyChart (or called to you) after review, usually within 72hours after test completion. If any changes need to be made, you will be notified at that same time.  Please schedule followup in 6 months, call sooner if problems.  Hypertension Hypertension is another name for high blood pressure. High blood pressure may mean that your heart needs to work harder to pump blood. Blood pressure consists of two numbers, which includes a higher number over a lower number (example: 110/72). HOME CARE   Make lifestyle changes as told by your doctor. This may include weight loss and exercise.  Take your blood pressure medicine every day.  Limit how much salt you use.  Stop smoking if you smoke.  Do not use drugs.  Talk to your doctor if you are using decongestants or birth control pills. These medicines might make blood pressure higher.  Females should not drink more than 1 alcoholic drink per day. Males should not drink more than 2 alcoholic drinks per day.  See your doctor as told. GET HELP RIGHT AWAY IF:   You have a blood pressure reading with a top number of 180 or higher.  You get a very bad headache.  You get blurred or changing vision.  You feel confused.  You feel weak, numb, or faint.  You get chest or belly (abdominal) pain.  You throw up (vomit).  You cannot breathe very well. MAKE SURE YOU:   Understand these instructions.  Will watch your condition.  Will get help right away if you are not doing well or get worse. Document Released: 04/15/2008 Document Revised: 01/20/2012 Document Reviewed: 04/15/2008 [facility] Patient  Information 2014 [place], [place].

## 2013-10-20 NOTE — Assessment & Plan Note (Signed)
The current medical regimen is generally effective;  continue present plan and medications.  Medication changes reviewed -improved with increased amlodipine 09/2013 by cards Educated to avoid NSAIDs as pain will allow BP Readings from Last 3 Encounters:  10/20/13 142/84  09/23/13 160/88  09/16/13 155/85

## 2013-10-20 NOTE — Assessment & Plan Note (Signed)
Intermittent mild symptoms - uses Albuterol prn Refill same today

## 2013-10-20 NOTE — Assessment & Plan Note (Signed)
No anginal symptoms reports last stress echo 2008 in [place] Follows with cards annually Continue medical management with aspirin, statin and ACE inhibitor

## 2013-10-20 NOTE — Progress Notes (Signed)
  Subjective:    Patient ID: Caleb Gray, male    DOB: 1941/09/28, 72 y.o.   MRN: [id number]  HPI  Here for follow up - reviewed chronic medical issues and interval events today:   Past Medical History  Diagnosis Date  . CORONARY ARTERY DISEASE   . ANEMIA-NOS   . ASTHMA   . Colon cancer     [place] 1997 stage III T3, N1 s/p rectosigmoid resection  . HYPERLIPIDEMIA   . HYPERTENSION   . Chronic diastolic heart failure   . UNSPECIFIED PERIPHERAL VASCULAR DISEASE   . Low back pain 10/2009    s/p laminectomy/decompression L4-5  . Right kidney mass 02/2007    benign (oncocytoma) s/p resection/total nephrectomy    Review of Systems  Respiratory: Negative for cough and shortness of breath.   Cardiovascular: Negative for chest pain and leg swelling.  Neurological: Negative for weakness and headaches.      Objective:   Physical Exam BP 142/84  Pulse 81  Temp(Src) 98.4 F (36.9 C) (Oral)  Wt 198 lb 12.8 oz (90.175 kg)  SpO2 97% Wt Readings from Last 3 Encounters:  10/20/13 198 lb 12.8 oz (90.175 kg)  09/23/13 198 lb (89.812 kg)  09/16/13 195 lb (88.451 kg)    Constitutional: he appears well-developed and well-nourished. No distress.  Neck: Normal range of motion. Neck supple. No JVD present. No thyromegaly present.  Cardiovascular: Normal rate, regular rhythm and normal heart sounds.  No murmur heard. No BLE edema. Pulmonary/Chest: Effort normal and breath sounds normal. No respiratory distress. he has no wheezes.  Psychiatric: he has a normal mood and affect. behavior is normal. Judgment and thought content normal.  Lab Results  Component Value Date   WBC 8.3 09/02/2013   HGB 14.0 09/02/2013   HCT 39.9 09/02/2013   PLT 228 09/02/2013   CHOL 126 05/05/2013   TRIG 101.0 05/05/2013   HDL 41.80 05/05/2013   LDLDIRECT 159.7 11/28/2009   ALT 29 09/02/2013   AST 26 09/02/2013   NA 141 09/02/2013   K 4.5 09/02/2013   CL 106 05/05/2013   CREATININE 1.4* 09/02/2013   BUN  15.1 09/02/2013   CO2 25 09/02/2013   TSH 1.81 11/26/2007   INR 1.0 ratio 10/27/2009   HGBA1C 5.9 03/08/2009       Assessment & Plan:   See problem list. Medications and labs reviewed today.

## 2013-10-20 NOTE — Assessment & Plan Note (Signed)
Lipitor increased 11/2010 from 10qd to 20qd to get LDL<70 Recheck now - adjust prn 

## 2013-10-26 ENCOUNTER — Other Ambulatory Visit: Payer: Self-pay | Admitting: Internal Medicine

## 2014-03-14 DIAGNOSIS — H2510 Age-related nuclear cataract, unspecified eye: Secondary | ICD-10-CM | POA: Diagnosis not present

## 2014-04-15 DIAGNOSIS — D485 Neoplasm of uncertain behavior of skin: Secondary | ICD-10-CM | POA: Diagnosis not present

## 2014-04-15 DIAGNOSIS — C44519 Basal cell carcinoma of skin of other part of trunk: Secondary | ICD-10-CM | POA: Diagnosis not present

## 2014-04-20 ENCOUNTER — Encounter: Payer: Self-pay | Admitting: Internal Medicine

## 2014-04-20 ENCOUNTER — Ambulatory Visit (INDEPENDENT_AMBULATORY_CARE_PROVIDER_SITE_OTHER): Payer: Medicare Other | Admitting: Internal Medicine

## 2014-04-20 ENCOUNTER — Other Ambulatory Visit (INDEPENDENT_AMBULATORY_CARE_PROVIDER_SITE_OTHER): Payer: Medicare Other

## 2014-04-20 VITALS — BP 140/72 | HR 74 | Temp 98.1°F | Wt 202.0 lb

## 2014-04-20 DIAGNOSIS — E785 Hyperlipidemia, unspecified: Secondary | ICD-10-CM

## 2014-04-20 DIAGNOSIS — I10 Essential (primary) hypertension: Secondary | ICD-10-CM

## 2014-04-20 DIAGNOSIS — Z79899 Other long term (current) drug therapy: Secondary | ICD-10-CM

## 2014-04-20 LAB — BASIC METABOLIC PANEL
BUN: 23 mg/dL (ref 6–23)
CO2: 27 mEq/L (ref 19–32)
Calcium: 9.2 mg/dL (ref 8.4–10.5)
Chloride: 109 mEq/L (ref 96–112)
Creatinine, Ser: 1.3 mg/dL (ref 0.4–1.5)
GFR: 55.57 mL/min — AB (ref >60.00)
Glucose, Bld: 110 mg/dL — ABNORMAL HIGH (ref 70–99)
POTASSIUM: 4 meq/L (ref 3.5–5.1)
SODIUM: 141 meq/L (ref 135–145)

## 2014-04-20 LAB — LIPID PANEL
CHOLESTEROL: 116 mg/dL (ref 0–200)
HDL: 42.8 mg/dL (ref >39.00)
LDL Cholesterol: 60 mg/dL (ref 0–99)
NonHDL: 73.2
TRIGLYCERIDES: 67 mg/dL (ref 0.0–149.0)
Total CHOL/HDL Ratio: 3
VLDL: 13.4 mg/dL (ref 0.0–40.0)

## 2014-04-20 LAB — HEPATIC FUNCTION PANEL
ALBUMIN: 3.7 g/dL (ref 3.5–5.2)
ALT: 34 U/L (ref 0–53)
AST: 28 U/L (ref 0–37)
Alkaline Phosphatase: 63 U/L (ref 39–117)
BILIRUBIN TOTAL: 0.9 mg/dL (ref 0.2–1.2)
Bilirubin, Direct: 0.2 mg/dL (ref 0.0–0.3)
Total Protein: 6.5 g/dL (ref 6.0–8.3)

## 2014-04-20 MED ORDER — IBUPROFEN 800 MG PO TABS: 800.0000 mg | ORAL_TABLET | Freq: Two times a day (BID) | ORAL | Status: DC | PRN

## 2014-04-20 MED ORDER — ALBUTEROL SULFATE HFA 108 (90 BASE) MCG/ACT IN AERS: 2.0000 | INHALATION_SPRAY | Freq: Four times a day (QID) | RESPIRATORY_TRACT | Status: DC | PRN

## 2014-04-20 MED ORDER — IBUPROFEN 800 MG PO TABS: ORAL_TABLET | ORAL | Status: DC

## 2014-04-20 NOTE — Progress Notes (Signed)
Subjective:    Patient ID: Caleb Gray, male    DOB: [birth year], 73 y.o.   MRN: [id number]  HPI  Patient here for follow up Reviewed chronic medical issues and interval medical events  Past Medical History  Diagnosis Date  . CORONARY ARTERY DISEASE   . ANEMIA-NOS   . ASTHMA   . Colon cancer     [place] 1997 stage III T3, N1 s/p rectosigmoid resection  . HYPERLIPIDEMIA   . HYPERTENSION   . Chronic diastolic heart failure   . UNSPECIFIED PERIPHERAL VASCULAR DISEASE   . Low back pain 10/2009    s/p laminectomy/decompression L4-5  . Right kidney mass 02/2007    benign (oncocytoma) s/p resection/total nephrectomy    Review of Systems  Constitutional: Negative for fever and fatigue.  Respiratory: Negative for cough and shortness of breath.   Cardiovascular: Negative for chest pain and leg swelling.       Objective:   Physical Exam  BP 140/72  Pulse 74  Temp(Src) 98.1 F (36.7 C) (Oral)  Wt 202 lb (91.627 kg)  SpO2 97% Wt Readings from Last 3 Encounters:  04/20/14 202 lb (91.627 kg)  10/20/13 198 lb 12.8 oz (90.175 kg)  09/23/13 198 lb (89.812 kg)   Constitutional: he appears well-developed and well-nourished. No distress. spouse at side Neck: Normal range of motion. Neck supple. No JVD present. No thyromegaly present.  Cardiovascular: Normal rate, regular rhythm and normal heart sounds.  No murmur heard. No BLE edema. Pulmonary/Chest: Effort normal and breath sounds normal. No respiratory distress. he has no wheezes.  Psychiatric: he has a normal mood and affect. His behavior is normal. Judgment and thought content normal.   Lab Results  Component Value Date   WBC 8.3 09/02/2013   HGB 14.0 09/02/2013   HCT 39.9 09/02/2013   PLT 228 09/02/2013   GLUCOSE 87 09/02/2013   CHOL 119 10/20/2013   TRIG 56.0 10/20/2013   HDL 46.70 10/20/2013   LDLDIRECT 159.7 11/28/2009   LDLCALC 61 10/20/2013   ALT 29 09/02/2013   AST 26 09/02/2013   NA 141 09/02/2013   K  4.5 09/02/2013   CL 106 05/05/2013   CREATININE 1.4* 09/02/2013   BUN 15.1 09/02/2013   CO2 25 09/02/2013   TSH 1.81 11/26/2007   INR 1.0 ratio 10/27/2009   HGBA1C 5.9 03/08/2009    Dg Chest 2 View  11/02/2012   *RADIOLOGY REPORT*  Clinical Data: Cough, wheezing, shortness of breath, former smoking history  CHEST - 2 VIEW  Comparison: None.  Findings: No active infiltrate or effusion is seen.  There is some peribronchial thickening most consistent with bronchitis, possibly chronic.  Mediastinal contours appear normal.  The heart is within upper limits of normal.  No bony abnormality is seen.  IMPRESSION: No pneumonia.  Peribronchial thickening may indicate bronchitis.   Original Report Authenticated By: Ivar Drape, M.D.       Assessment & Plan:   Problem List Items Addressed This Visit   HYPERLIPIDEMIA - Primary     Lipitor increased 11/2010 from 10qd to 20qd to get LDL<70 Recheck now - adjust prn    Relevant Orders      Lipid panel   HYPERTENSION      The current medical regimen is generally effective;  continue present plan and medications.  Medication changes reviewed -improved with increased amlodipine 09/2013 by cards Educated to avoid NSAIDs as pain will allow BP Readings from Last 3 Encounters:  04/20/14 140/72  10/20/13 142/84  09/23/13 160/88      Relevant Orders      Basic metabolic panel      Lipid panel    Other Visit Diagnoses   Encounter for long-term (current) use of other medications        Relevant Orders       Hepatic function panel

## 2014-04-20 NOTE — Assessment & Plan Note (Signed)
The current medical regimen is generally effective;  continue present plan and medications.  Medication changes reviewed -improved with increased amlodipine 09/2013 by cards Educated to avoid NSAIDs as pain will allow BP Readings from Last 3 Encounters:  04/20/14 140/72  10/20/13 142/84  09/23/13 160/88

## 2014-04-20 NOTE — Patient Instructions (Signed)
It was good to see you today.  We have reviewed your prior records including labs and tests today  Test(s) ordered today. Your results will be released to [place] (or called to you) after review, usually within 72hours after test completion. If any changes need to be made, you will be notified at that same time.  Medications reviewed and updated, no changes recommended at this time.  Please schedule followup in 6 months for semiannual visit and labs, call sooner if problems.

## 2014-04-20 NOTE — Progress Notes (Signed)
Pre visit review using our clinic review tool, if applicable. No additional management support is needed unless otherwise documented below in the visit note. 

## 2014-04-20 NOTE — Assessment & Plan Note (Signed)
Lipitor increased 11/2010 from 10qd to 20qd to get LDL<70 Recheck now - adjust prn

## 2014-04-22 DIAGNOSIS — C44519 Basal cell carcinoma of skin of other part of trunk: Secondary | ICD-10-CM | POA: Diagnosis not present

## 2014-04-22 DIAGNOSIS — C4491 Basal cell carcinoma of skin, unspecified: Secondary | ICD-10-CM | POA: Insufficient documentation

## 2014-04-22 DIAGNOSIS — L820 Inflamed seborrheic keratosis: Secondary | ICD-10-CM | POA: Diagnosis not present

## 2014-04-22 DIAGNOSIS — L905 Scar conditions and fibrosis of skin: Secondary | ICD-10-CM | POA: Diagnosis not present

## 2014-06-21 ENCOUNTER — Encounter: Payer: Self-pay | Admitting: Internal Medicine

## 2014-06-21 ENCOUNTER — Ambulatory Visit (INDEPENDENT_AMBULATORY_CARE_PROVIDER_SITE_OTHER): Payer: Medicare Other | Admitting: Internal Medicine

## 2014-06-21 ENCOUNTER — Other Ambulatory Visit (INDEPENDENT_AMBULATORY_CARE_PROVIDER_SITE_OTHER): Payer: Medicare Other

## 2014-06-21 VITALS — BP 158/80 | HR 81 | Temp 98.0°F | Ht 71.0 in | Wt 201.0 lb

## 2014-06-21 DIAGNOSIS — I2510 Atherosclerotic heart disease of native coronary artery without angina pectoris: Secondary | ICD-10-CM | POA: Diagnosis not present

## 2014-06-21 DIAGNOSIS — R609 Edema, unspecified: Secondary | ICD-10-CM

## 2014-06-21 DIAGNOSIS — I10 Essential (primary) hypertension: Secondary | ICD-10-CM | POA: Diagnosis not present

## 2014-06-21 LAB — CBC WITH DIFFERENTIAL/PLATELET
BASOS ABS: 0 10*3/uL (ref 0.0–0.1)
Basophils Relative: 0.5 % (ref 0.0–3.0)
Eosinophils Absolute: 0.5 10*3/uL (ref 0.0–0.7)
Eosinophils Relative: 6.8 % — ABNORMAL HIGH (ref 0.0–5.0)
HCT: 43.6 % (ref 39.0–52.0)
Hemoglobin: 14.7 g/dL (ref 13.0–17.0)
LYMPHS PCT: 23.3 % (ref 12.0–46.0)
Lymphs Abs: 1.9 10*3/uL (ref 0.7–4.0)
MCHC: 33.7 g/dL (ref 30.0–36.0)
MCV: 96.3 fl (ref 78.0–100.0)
Monocytes Absolute: 0.6 10*3/uL (ref 0.1–1.0)
Monocytes Relative: 7.8 % (ref 3.0–12.0)
NEUTROS ABS: 5 10*3/uL (ref 1.4–7.7)
Neutrophils Relative %: 61.6 % (ref 43.0–77.0)
Platelets: 264 10*3/uL (ref 150.0–400.0)
RBC: 4.53 Mil/uL (ref 4.22–5.81)
RDW: 13.1 % (ref 11.5–15.5)
WBC: 8.1 10*3/uL (ref 4.0–10.5)

## 2014-06-21 LAB — TSH: TSH: 1.44 u[IU]/mL (ref 0.35–4.50)

## 2014-06-21 LAB — HEPATIC FUNCTION PANEL
ALBUMIN: 4.1 g/dL (ref 3.5–5.2)
ALK PHOS: 76 U/L (ref 39–117)
ALT: 36 U/L (ref 0–53)
AST: 31 U/L (ref 0–37)
Bilirubin, Direct: 0.2 mg/dL (ref 0.0–0.3)
Total Bilirubin: 0.9 mg/dL (ref 0.2–1.2)
Total Protein: 7.3 g/dL (ref 6.0–8.3)

## 2014-06-21 LAB — BASIC METABOLIC PANEL
BUN: 21 mg/dL (ref 6–23)
CALCIUM: 9.5 mg/dL (ref 8.4–10.5)
CO2: 26 mEq/L (ref 19–32)
Chloride: 105 mEq/L (ref 96–112)
Creatinine, Ser: 1.4 mg/dL (ref 0.4–1.5)
GFR: 54.14 mL/min — ABNORMAL LOW (ref >60.00)
Glucose, Bld: 113 mg/dL — ABNORMAL HIGH (ref 70–99)
Potassium: 4.2 mEq/L (ref 3.5–5.1)
SODIUM: 139 meq/L (ref 135–145)

## 2014-06-21 MED ORDER — ATORVASTATIN CALCIUM 20 MG PO TABS: 20.0000 mg | ORAL_TABLET | Freq: Every day | ORAL | Status: DC

## 2014-06-21 MED ORDER — FUROSEMIDE 20 MG PO TABS: 20.0000 mg | ORAL_TABLET | Freq: Every day | ORAL | Status: DC

## 2014-06-21 MED ORDER — ENALAPRIL MALEATE 20 MG PO TABS: 20.0000 mg | ORAL_TABLET | Freq: Every day | ORAL | Status: DC

## 2014-06-21 NOTE — Progress Notes (Signed)
Pre visit review using our clinic review tool, if applicable. No additional management support is needed unless otherwise documented below in the visit note. 

## 2014-06-21 NOTE — Patient Instructions (Signed)
It was good to see you today.  We have reviewed your prior records including labs and tests today  Test(s) ordered today. Your results will be released to [place] (or called to you) after review, usually within 72hours after test completion. If any changes need to be made, you will be notified at that same time.  Medications reviewed and updated begin Lasix 20 mg each morning for swelling x 5 days, then as needed - no other changes recommended at this time.  we'll make referral echocardiogram to evaluate heart pump . Our office will contact you regarding appointment(s) once made.  Please schedule followup in 4 months, call sooner if problems.

## 2014-06-21 NOTE — Progress Notes (Signed)
Subjective:    Patient ID: Caleb Gray, male    DOB: October 19, 1941, 73 y.o.   MRN: [id number]  HPI  Patient here for new edema Reviewed chronic medical issues and interval medical events  Past Medical History  Diagnosis Date  . CORONARY ARTERY DISEASE   . ANEMIA-NOS   . ASTHMA   . Colon cancer     [place] 1997 stage III T3, N1 s/p rectosigmoid resection  . HYPERLIPIDEMIA   . HYPERTENSION   . Chronic diastolic heart failure   . UNSPECIFIED PERIPHERAL VASCULAR DISEASE   . Low back pain 10/2009    s/p laminectomy/decompression L4-5  . Right kidney mass 02/2007    benign (oncocytoma) s/p resection/total nephrectomy    Review of Systems  Constitutional: Negative for fever, fatigue and unexpected weight change.  Respiratory: Negative for cough, chest tightness and shortness of breath.   Cardiovascular: Positive for leg swelling. Negative for chest pain and palpitations.       Objective:   Physical Exam  BP 158/80  Pulse 81  Temp(Src) 98 F (36.7 C) (Oral)  Ht 5\' 11"  (1.803 m)  Wt 201 lb (91.173 kg)  BMI 28.05 kg/m2  SpO2 97% Wt Readings from Last 3 Encounters:  06/21/14 201 lb (91.173 kg)  04/20/14 202 lb (91.627 kg)  10/20/13 198 lb 12.8 oz (90.175 kg)   Constitutional: he appears well-developed and well-nourished. No distress. Wife at side Neck: Normal range of motion. Neck supple. No JVD present. No thyromegaly present.  Cardiovascular: Normal rate, regular rhythm and normal heart sounds.  No murmur heard. trace L>R BLE edema. Pulmonary/Chest: Effort normal and breath sounds normal. No respiratory distress. he has no wheezes.  Psychiatric: he has a normal mood and affect. His behavior is normal. Judgment and thought content normal.   Lab Results  Component Value Date   WBC 8.3 09/02/2013   HGB 14.0 09/02/2013   HCT 39.9 09/02/2013   PLT 228 09/02/2013   GLUCOSE 110* 04/20/2014   CHOL 116 04/20/2014   TRIG 67.0 04/20/2014   HDL 42.80 04/20/2014   LDLDIRECT  159.7 11/28/2009   LDLCALC 60 04/20/2014   ALT 34 04/20/2014   AST 28 04/20/2014   NA 141 04/20/2014   K 4.0 04/20/2014   CL 109 04/20/2014   CREATININE 1.3 04/20/2014   BUN 23 04/20/2014   CO2 27 04/20/2014   TSH 1.81 11/26/2007   INR 1.0 ratio 10/27/2009   HGBA1C 5.9 03/08/2009    Dg Chest 2 View  11/02/2012   *RADIOLOGY REPORT*  Clinical Data: Cough, wheezing, shortness of breath, former smoking history  CHEST - 2 VIEW  Comparison: None.  Findings: No active infiltrate or effusion is seen.  There is some peribronchial thickening most consistent with bronchitis, possibly chronic.  Mediastinal contours appear normal.  The heart is within upper limits of normal.  No bony abnormality is seen.  IMPRESSION: No pneumonia.  Peribronchial thickening may indicate bronchitis.   Original Report Authenticated By: Ivar Drape, M.D.       Assessment & Plan:   Edema, L>R ongoing >30mo, currently improved since onset 03/2014 but not resolved Check labs Check echo Add diuretics daily x 5 d then prn Potential related to amlodipine reviewed, but dose increased 09/2013 and symptoms onset not until 03/2014 - Discussed Na and elevation  Problem List Items Addressed This Visit   CORONARY ARTERY DISEASE     No overt anginal symptoms reported reports last stress echo 2008 in [place], may  need to repeat same depending on echo results (ordered today for edema x 53mo) Follows with cards annually, last OV reviewed Continue medical management with aspirin, statin and ACE inhibitor    Relevant Medications      enalapril (VASOTEC) tablet      atorvastatin (LIPITOR) tablet      furosemide (LASIX) tablet   HYPERTENSION      The current medical regimen is generally effective (per report of home BPs) Medication reviewed -reports improved control since increased amlodipine 09/2013 by cards Educated to avoid NSAIDs as pain will allow, esp with new edema since 03/2014  BP Readings from Last 3 Encounters:  06/21/14 158/80   04/20/14 140/72  10/20/13 142/84      Relevant Medications      enalapril (VASOTEC) tablet      atorvastatin (LIPITOR) tablet      furosemide (LASIX) tablet   Other Relevant Orders      Basic metabolic panel (Completed)      CBC with Differential (Completed)      Hepatic function panel (Completed)      TSH (Completed)      2D Echocardiogram without contrast    Other Visit Diagnoses   Edema    -  Primary    Relevant Orders       Basic metabolic panel (Completed)       CBC with Differential (Completed)       Hepatic function panel (Completed)       TSH (Completed)       2D Echocardiogram without contrast

## 2014-06-22 ENCOUNTER — Telehealth: Payer: Self-pay | Admitting: Internal Medicine

## 2014-06-22 MED ORDER — ENALAPRIL MALEATE 20 MG PO TABS: 20.0000 mg | ORAL_TABLET | Freq: Every day | ORAL | Status: DC

## 2014-06-22 MED ORDER — ATORVASTATIN CALCIUM 20 MG PO TABS: 20.0000 mg | ORAL_TABLET | Freq: Every day | ORAL | Status: DC

## 2014-06-22 NOTE — Assessment & Plan Note (Signed)
The current medical regimen is generally effective (per report of home BPs) Medication reviewed -reports improved control since increased amlodipine 09/2013 by cards Educated to avoid NSAIDs as pain will allow, esp with new edema since 03/2014  BP Readings from Last 3 Encounters:  06/21/14 158/80  04/20/14 140/72  10/20/13 142/84

## 2014-06-22 NOTE — Telephone Encounter (Signed)
Relevant patient education assigned to patient using Emmi. ° °

## 2014-06-22 NOTE — Assessment & Plan Note (Signed)
No overt anginal symptoms reported reports last stress echo 2008 in [place], may need to repeat same depending on echo results (ordered today for edema x 71mo) Follows with cards annually, last OV reviewed Continue medical management with aspirin, statin and ACE inhibitor

## 2014-06-30 ENCOUNTER — Ambulatory Visit (HOSPITAL_COMMUNITY): Payer: Medicare Other | Attending: Cardiology | Admitting: Radiology

## 2014-06-30 ENCOUNTER — Other Ambulatory Visit (HOSPITAL_COMMUNITY): Payer: Self-pay | Admitting: Internal Medicine

## 2014-06-30 DIAGNOSIS — I25119 Atherosclerotic heart disease of native coronary artery with unspecified angina pectoris: Secondary | ICD-10-CM

## 2014-06-30 DIAGNOSIS — I517 Cardiomegaly: Secondary | ICD-10-CM | POA: Insufficient documentation

## 2014-06-30 DIAGNOSIS — I10 Essential (primary) hypertension: Secondary | ICD-10-CM

## 2014-06-30 DIAGNOSIS — R079 Chest pain, unspecified: Secondary | ICD-10-CM

## 2014-06-30 DIAGNOSIS — R609 Edema, unspecified: Secondary | ICD-10-CM | POA: Diagnosis not present

## 2014-06-30 NOTE — Progress Notes (Signed)
Echocardiogram performed.  

## 2014-08-23 ENCOUNTER — Other Ambulatory Visit: Payer: Self-pay | Admitting: *Deleted

## 2014-08-23 DIAGNOSIS — Z23 Encounter for immunization: Secondary | ICD-10-CM | POA: Diagnosis not present

## 2014-08-23 MED ORDER — AMLODIPINE BESYLATE 10 MG PO TABS: 10.0000 mg | ORAL_TABLET | Freq: Every day | ORAL | Status: DC

## 2014-09-01 ENCOUNTER — Other Ambulatory Visit (HOSPITAL_BASED_OUTPATIENT_CLINIC_OR_DEPARTMENT_OTHER): Payer: Medicare Other

## 2014-09-01 ENCOUNTER — Ambulatory Visit (HOSPITAL_BASED_OUTPATIENT_CLINIC_OR_DEPARTMENT_OTHER): Payer: Medicare Other | Admitting: Oncology

## 2014-09-01 ENCOUNTER — Encounter: Payer: Self-pay | Admitting: Oncology

## 2014-09-01 VITALS — BP 141/76 | HR 84 | Temp 98.2°F | Resp 19 | Ht 71.0 in | Wt 205.3 lb

## 2014-09-01 DIAGNOSIS — Z85038 Personal history of other malignant neoplasm of large intestine: Secondary | ICD-10-CM | POA: Diagnosis not present

## 2014-09-01 DIAGNOSIS — Z85528 Personal history of other malignant neoplasm of kidney: Secondary | ICD-10-CM

## 2014-09-01 DIAGNOSIS — C189 Malignant neoplasm of colon, unspecified: Secondary | ICD-10-CM

## 2014-09-01 LAB — CBC WITH DIFFERENTIAL/PLATELET
BASO%: 1.2 % (ref 0.0–2.0)
BASOS ABS: 0.1 10*3/uL (ref 0.0–0.1)
EOS%: 6.1 % (ref 0.0–7.0)
Eosinophils Absolute: 0.5 10*3/uL (ref 0.0–0.5)
HCT: 40.5 % (ref 38.4–49.9)
HEMOGLOBIN: 13.5 g/dL (ref 13.0–17.1)
LYMPH#: 2.1 10*3/uL (ref 0.9–3.3)
LYMPH%: 24.6 % (ref 14.0–49.0)
MCH: 31.4 pg (ref 27.2–33.4)
MCHC: 33.4 g/dL (ref 32.0–36.0)
MCV: 94 fL (ref 79.3–98.0)
MONO#: 0.9 10*3/uL (ref 0.1–0.9)
MONO%: 10.5 % (ref 0.0–14.0)
NEUT#: 4.8 10*3/uL (ref 1.5–6.5)
NEUT%: 57.6 % (ref 39.0–75.0)
Platelets: 242 10*3/uL (ref 140–400)
RBC: 4.3 10*6/uL (ref 4.20–5.82)
RDW: 13.5 % (ref 11.0–14.6)
WBC: 8.4 10*3/uL (ref 4.0–10.3)

## 2014-09-01 LAB — COMPREHENSIVE METABOLIC PANEL (CC13)
ALT: 40 U/L (ref 0–55)
ANION GAP: 6 meq/L (ref 3–11)
AST: 29 U/L (ref 5–34)
Albumin: 3.5 g/dL (ref 3.5–5.0)
Alkaline Phosphatase: 82 U/L (ref 40–150)
BUN: 22.2 mg/dL (ref 7.0–26.0)
CALCIUM: 9.4 mg/dL (ref 8.4–10.4)
CHLORIDE: 108 meq/L (ref 98–109)
CO2: 27 meq/L (ref 22–29)
CREATININE: 1.4 mg/dL — AB (ref 0.7–1.3)
GLUCOSE: 69 mg/dL — AB (ref 70–140)
Potassium: 4.5 mEq/L (ref 3.5–5.1)
Sodium: 141 mEq/L (ref 136–145)
Total Bilirubin: 0.62 mg/dL (ref 0.20–1.20)
Total Protein: 6.3 g/dL — ABNORMAL LOW (ref 6.4–8.3)

## 2014-09-01 NOTE — Progress Notes (Signed)
Hematology and Oncology Follow Up Visit  SATYA BUTTRAM [id number] 1940-12-25 73 y.o. 09/01/2014 1:38 PM  CC: Mateo Flow A. Asa Lente, MD  [place] Jeffie Pollock, M.D.    Principle Diagnosis:  This is a 73 year old gentleman with the following diagnoses: 1. History of T3 N1 colon cancer diagnosed in 1997, status post rectosigmoid resection, adjuvant chemotherapy with 5-FU without any evidence to suggest recurrent disease. 2.     Oncocytic tumor of the kidney, status post nephrectomy of the right kidney.  No evidence of any recurrent disease since 2008.  Current therapy: Observation and surveillance.  Interim History: Mr. Glascoe presents today for a followup visit. Since the last visit, he had not reported any major problems, had not reported any chest pain, had not reported any difficulty breathing, had not reported any abdominal pain or urinary symptoms. She did undergo a colonoscopy and did show a polyp was removed. He is supposed to have that repeated in 5 years. Activity level remains about the same. No new complaints today. He does not report any headaches or blurry vision or syncope. He reports no fevers or chills or sweats. He does not have any weight loss or appetite changes. As I pointed chest pain, shortness of breath were palpitation. Does not report any frequency urgency or hematuria. Rest of review of systems unremarkable.  Medications: I have reviewed the patient's current medications. Current Outpatient Prescriptions  Medication Sig Dispense Refill  . albuterol (PROAIR HFA) 108 (90 BASE) MCG/ACT inhaler Inhale 2 puffs into the lungs 4 (four) times daily as needed.  72 g  0  . amLODipine (NORVASC) 10 MG tablet Take 1 tablet (10 mg total) by mouth daily.  90 tablet  3  . aspirin 81 MG tablet Take 81 mg by mouth daily.        Marland Kitchen atorvastatin (LIPITOR) 20 MG tablet Take 1 tablet (20 mg total) by mouth at bedtime.  90 tablet  3  . enalapril (VASOTEC) 20 MG tablet Take 1 tablet (20 mg total)  by mouth daily.  90 tablet  3  . furosemide (LASIX) 20 MG tablet Take 1 tablet (20 mg total) by mouth daily.  30 tablet  3  . ibuprofen (ADVIL,MOTRIN) 800 MG tablet Take 1 tablet (800 mg total) by mouth 2 (two) times daily as needed.  90 tablet  1  . Multiple Vitamins-Minerals (CENTRUM SILVER) tablet Take 1 tablet by mouth daily.        . Omega-3 Fatty Acids (FISH OIL) 1000 MG CAPS Take by mouth 2 (two) times daily.        No current facility-administered medications for this visit.    Allergies:  Allergies  Allergen Reactions  . Penicillins     Very dry and itchy skin, make throat "feel funny:    Past Medical History, Surgical history, Social history, and Family History were reviewed and updated.   Physical Exam: Blood pressure 141/76, pulse 84, temperature 98.2 F (36.8 C), temperature source Oral, resp. rate 19, height 5\' 11"  (1.803 m), weight 205 lb 4.8 oz (93.123 kg). ECOG: 0 General appearance: alert Head: Normocephalic, without obvious abnormality Neck: no adenopathy Lymph nodes: Cervical, supraclavicular, and axillary nodes normal. Heart:regular rate and rhythm, S1, S2 normal, no murmur, click, rub or gallop Lung:chest clear, no wheezing, rales, normal symmetric air entry Abdomin: soft, non-tender, without masses or organomegaly EXT:no evidence of joint effusion   Lab Results: Lab Results  Component Value Date   WBC 8.4 09/01/2014   HGB 13.5  09/01/2014   HCT 40.5 09/01/2014   MCV 94.0 09/01/2014   PLT 242 09/01/2014    Impression and Plan:  This is a pleasant 73 year-old gentleman with the following issues: 1. Colon cancer diagnosed in 1997.  He had stage III disease with no evidence to suggest recurrent disease. He is status post colonoscopy without any evidence of polyps removed. 2. Oncocytic tumor of the kidney, status post nephrectomy.  No evidence to suggest recurrent disease.   3. Followup: Will be as needed from an oncology  standpoint.    WLSLHT,DSKAJ, MD10/22/20151:[id number] PM

## 2014-10-14 DIAGNOSIS — L820 Inflamed seborrheic keratosis: Secondary | ICD-10-CM | POA: Diagnosis not present

## 2014-10-14 DIAGNOSIS — Z85828 Personal history of other malignant neoplasm of skin: Secondary | ICD-10-CM | POA: Diagnosis not present

## 2014-10-14 DIAGNOSIS — L821 Other seborrheic keratosis: Secondary | ICD-10-CM | POA: Diagnosis not present

## 2014-10-14 DIAGNOSIS — D1800 Hemangioma unspecified site: Secondary | ICD-10-CM | POA: Diagnosis not present

## 2014-10-19 ENCOUNTER — Encounter: Payer: Self-pay | Admitting: Internal Medicine

## 2014-10-19 ENCOUNTER — Other Ambulatory Visit (INDEPENDENT_AMBULATORY_CARE_PROVIDER_SITE_OTHER): Payer: Medicare Other

## 2014-10-19 ENCOUNTER — Ambulatory Visit (INDEPENDENT_AMBULATORY_CARE_PROVIDER_SITE_OTHER): Payer: Medicare Other | Admitting: Internal Medicine

## 2014-10-19 VITALS — BP 138/82 | HR 56 | Temp 98.0°F | Ht 71.0 in | Wt 204.1 lb

## 2014-10-19 DIAGNOSIS — I2510 Atherosclerotic heart disease of native coronary artery without angina pectoris: Secondary | ICD-10-CM

## 2014-10-19 DIAGNOSIS — I10 Essential (primary) hypertension: Secondary | ICD-10-CM | POA: Diagnosis not present

## 2014-10-19 DIAGNOSIS — Z0000 Encounter for general adult medical examination without abnormal findings: Secondary | ICD-10-CM

## 2014-10-19 DIAGNOSIS — E785 Hyperlipidemia, unspecified: Secondary | ICD-10-CM

## 2014-10-19 DIAGNOSIS — Z23 Encounter for immunization: Secondary | ICD-10-CM

## 2014-10-19 LAB — BASIC METABOLIC PANEL
BUN: 18 mg/dL (ref 6–23)
CO2: 25 mEq/L (ref 19–32)
Calcium: 9 mg/dL (ref 8.4–10.5)
Chloride: 105 mEq/L (ref 96–112)
Creatinine, Ser: 1.3 mg/dL (ref 0.4–1.5)
GFR: 56.96 mL/min — ABNORMAL LOW (ref >60.00)
Glucose, Bld: 113 mg/dL — ABNORMAL HIGH (ref 70–99)
POTASSIUM: 4 meq/L (ref 3.5–5.1)
Sodium: 137 mEq/L (ref 135–145)

## 2014-10-19 LAB — LIPID PANEL
CHOLESTEROL: 123 mg/dL (ref 0–200)
HDL: 39.1 mg/dL (ref >39.00)
LDL CALC: 68 mg/dL (ref 0–99)
NonHDL: 83.9
TRIGLYCERIDES: 79 mg/dL (ref 0.0–149.0)
Total CHOL/HDL Ratio: 3
VLDL: 15.8 mg/dL (ref 0.0–40.0)

## 2014-10-19 MED ORDER — FUROSEMIDE 20 MG PO TABS: 20.0000 mg | ORAL_TABLET | Freq: Every day | ORAL | Status: DC | PRN

## 2014-10-19 MED ORDER — IBUPROFEN 800 MG PO TABS: 800.0000 mg | ORAL_TABLET | Freq: Two times a day (BID) | ORAL | Status: DC | PRN

## 2014-10-19 NOTE — Assessment & Plan Note (Signed)
The current medical regimen is generally effective (per report of home BPs) Medication reviewed -reports improved control since increased amlodipine 09/2013 by cards Educated to minimize NSAIDs due to new edema since 03/2014, resolved at this time with diet changes and less ibuprofen   BP Readings from Last 3 Encounters:  10/19/14 138/82  09/01/14 141/76  06/21/14 158/80

## 2014-10-19 NOTE — Progress Notes (Signed)
Subjective:    Patient ID: Caleb Gray, male    DOB: [birth year], 73 y.o.   MRN: [id number]  HPI   Here for medicare wellness  Diet: heart healthy Physical activity: sedentary Depression/mood screen: negative Hearing: intact to whispered voice Visual acuity: grossly normal, performs annual eye exam  ADLs: capable Fall risk: none Home safety: good Cognitive evaluation: intact to orientation, naming, recall and repetition EOL planning: adv directives, full code/ I agree  I have personally reviewed and have noted 1. The patient's medical and social history 2. Their use of alcohol, tobacco or illicit drugs 3. Their current medications and supplements 4. The patient's functional ability including ADL's, fall risks, home safety risks and hearing or visual impairment. 5. Diet and physical activities 6. Evidence for depression or mood disorders  Also reviewed chronic medical issues and interval medical events  Past Medical History  Diagnosis Date  . CORONARY ARTERY DISEASE   . ANEMIA-NOS   . ASTHMA   . Colon cancer     [place] 1997 stage III T3, N1 s/p rectosigmoid resection  . HYPERLIPIDEMIA   . HYPERTENSION   . Chronic diastolic heart failure   . UNSPECIFIED PERIPHERAL VASCULAR DISEASE   . Low back pain 10/2009    s/p laminectomy/decompression L4-5  . Right kidney mass 02/2007    benign (oncocytoma) s/p resection/total nephrectomy   Family History  Problem Relation Age of Onset  . Colon cancer Mother   . Heart disease Father   . Alcohol abuse Other     Parents  . Esophageal cancer Neg Hx   . Rectal cancer Neg Hx   . Stomach cancer Neg Hx    History  Substance Use Topics  . Smoking status: Former Smoker    Types: Cigarettes    Quit date: 11/11/1968  . Smokeless tobacco: Not on file     Comment: Married, lives with wife, retired in 2007. Moved from [place] to [place] 04/2009  . Alcohol Use: Yes     Comment: occasionally    Review of Systems    Constitutional: Negative for fever, activity change, appetite change, fatigue and unexpected weight change.  Respiratory: Negative for cough, chest tightness, shortness of breath and wheezing.   Cardiovascular: Negative for chest pain, palpitations and leg swelling.  Neurological: Negative for dizziness, weakness and headaches.  Psychiatric/Behavioral: Negative for dysphoric mood. The patient is not nervous/anxious.   All other systems reviewed and are negative.      Objective:   Physical Exam  BP 138/82 mmHg  Pulse 56  Temp(Src) 98 F (36.7 C) (Oral)  Ht 5\' 11"  (1.803 m)  Wt 204 lb 2 oz (92.59 kg)  BMI 28.48 kg/m2  SpO2 97% Wt Readings from Last 3 Encounters:  10/19/14 204 lb 2 oz (92.59 kg)  09/01/14 205 lb 4.8 oz (93.123 kg)  06/21/14 201 lb (91.173 kg)   Constitutional: he appears well-developed and well-nourished. No distress.  Neck: Normal range of motion. Neck supple. No JVD present. No thyromegaly present.  Cardiovascular: Normal rate, regular rhythm and normal heart sounds.  No murmur heard. trace ankle BLE edema. Pulmonary/Chest: Effort normal and breath sounds normal. No respiratory distress. he has no wheezes.  Psychiatric: he has a normal mood and affect. His behavior is normal. Judgment and thought content normal.   Lab Results  Component Value Date   WBC 8.4 09/01/2014   HGB 13.5 09/01/2014   HCT 40.5 09/01/2014   PLT 242 09/01/2014   GLUCOSE  69* 09/01/2014   CHOL 116 04/20/2014   TRIG 67.0 04/20/2014   HDL 42.80 04/20/2014   LDLDIRECT 159.7 11/28/2009   LDLCALC 60 04/20/2014   ALT 40 09/01/2014   AST 29 09/01/2014   NA 141 09/01/2014   K 4.5 09/01/2014   CL 105 06/21/2014   CREATININE 1.4* 09/01/2014   BUN 22.2 09/01/2014   CO2 27 09/01/2014   TSH 1.44 06/21/2014   INR 1.0 ratio 10/27/2009   HGBA1C 5.9 03/08/2009    Dg Chest 2 View  11/02/2012   *RADIOLOGY REPORT*  Clinical Data: Cough, wheezing, shortness of breath, former smoking history   CHEST - 2 VIEW  Comparison: None.  Findings: No active infiltrate or effusion is seen.  There is some peribronchial thickening most consistent with bronchitis, possibly chronic.  Mediastinal contours appear normal.  The heart is within upper limits of normal.  No bony abnormality is seen.  IMPRESSION: No pneumonia.  Peribronchial thickening may indicate bronchitis.   Original Report Authenticated By: Ivar Drape, M.D.       Assessment & Plan:   AWV/z00.00 - Today patient counseled on age appropriate routine health concerns for screening and prevention, each reviewed and up to date or declined. Immunizations reviewed and up to date or declined. Labs ordered and reviewed. Risk factors for depression reviewed and negative. Hearing function and visual acuity are intact. ADLs screened and addressed as needed. Functional ability and level of safety reviewed and appropriate. Education, counseling and referrals performed based on assessed risks today. Patient provided with a copy of personalized plan for preventive services.  Problem List Items Addressed This Visit    Dyslipidemia    Lipitor increased 11/2010 from 10qd to 20qd to get LDL<70 Recheck now - adjust prn    Relevant Orders      Lipid panel   Essential hypertension    The current medical regimen is generally effective (per report of home BPs) Medication reviewed -reports improved control since increased amlodipine 09/2013 by cards Educated to minimize NSAIDs due to new edema since 03/2014, resolved at this time with diet changes and less ibuprofen   BP Readings from Last 3 Encounters:  10/19/14 138/82  09/01/14 141/76  06/21/14 158/80      Relevant Medications      furosemide (LASIX) tablet   Other Relevant Orders      Basic metabolic panel      Lipid panel    Other Visit Diagnoses    Routine general medical examination at a health care facility    -  Primary

## 2014-10-19 NOTE — Assessment & Plan Note (Signed)
Lipitor increased 11/2010 from 10qd to 20qd to get LDL<70 Recheck now - adjust prn

## 2014-10-19 NOTE — Progress Notes (Signed)
Pre visit review using our clinic review tool, if applicable. No additional management support is needed unless otherwise documented below in the visit note. 

## 2014-10-19 NOTE — Patient Instructions (Addendum)
It was good to see you today.  We have reviewed your prior records including labs and tests today  Health Maintenance reviewed - Prevnar 13 (second pneumonia vaccine) and shingles vaccine administered today. All other recommended immunizations and age-appropriate screenings are up-to-date.  Test(s) ordered today. Your results will be released to Caleb Gray (or called to you) after review, usually within 72hours after test completion. If any changes need to be made, you will be notified at that same time.  Medications reviewed and updated, no changes recommended at this time.  Please schedule followup in 6 months for semiannual exam and labs, call sooner if problems.  Health Maintenance A healthy lifestyle and preventative care can promote health and wellness.  Maintain regular health, dental, and eye exams.  Eat a healthy diet. Foods like vegetables, fruits, whole grains, low-fat dairy products, and lean protein foods contain the nutrients you need and are low in calories. Decrease your intake of foods high in solid fats, added sugars, and salt. Get information about a proper diet from your health care provider, if necessary.  Regular physical exercise is one of the most important things you can do for your health. Most adults should get at least 150 minutes of moderate-intensity exercise (any activity that increases your heart rate and causes you to sweat) each week. In addition, most adults need muscle-strengthening exercises on 2 or more days a week.   Maintain a healthy weight. The body mass index (BMI) is a screening tool to identify possible weight problems. It provides an estimate of body fat based on height and weight. Your health care provider can find your BMI and can help you achieve or maintain a healthy weight. For males 20 years and older:  A BMI below 18.5 is considered underweight.  A BMI of 18.5 to 24.9 is normal.  A BMI of 25 to 29.9 is considered overweight.  A BMI of 30  and above is considered obese.  Maintain normal blood lipids and cholesterol by exercising and minimizing your intake of saturated fat. Eat a balanced diet with plenty of fruits and vegetables. Blood tests for lipids and cholesterol should begin at age 86 and be repeated every 5 years. If your lipid or cholesterol levels are high, you are over age 41, or you are at high risk for heart disease, you may need your cholesterol levels checked more frequently.Ongoing high lipid and cholesterol levels should be treated with medicines if diet and exercise are not working.  If you smoke, find out from your health care provider how to quit. If you do not use tobacco, do not start.  Lung cancer screening is recommended for adults aged 5-80 years who are at high risk for developing lung cancer because of a history of smoking. A yearly low-dose CT scan of the lungs is recommended for people who have at least a 30-pack-year history of smoking and are current smokers or have quit within the past 15 years. A pack year of smoking is smoking an average of 1 pack of cigarettes a day for 1 year (for example, a 30-pack-year history of smoking could mean smoking 1 pack a day for 30 years or 2 packs a day for 15 years). Yearly screening should continue until the smoker has stopped smoking for at least 15 years. Yearly screening should be stopped for people who develop a health problem that would prevent them from having lung cancer treatment.  If you choose to drink alcohol, do not have more than 2  drinks per day. One drink is considered to be 12 oz (360 mL) of beer, 5 oz (150 mL) of wine, or 1.5 oz (45 mL) of liquor.  Avoid the use of street drugs. Do not share needles with anyone. Ask for help if you need support or instructions about stopping the use of drugs.  High blood pressure causes heart disease and increases the risk of stroke. Blood pressure should be checked at least every 1-2 years. Ongoing high blood pressure  should be treated with medicines if weight loss and exercise are not effective.  If you are 38-47 years old, ask your health care provider if you should take aspirin to prevent heart disease.  Diabetes screening involves taking a blood sample to check your fasting blood sugar level. This should be done once every 3 years after age 42 if you are at a normal weight and without risk factors for diabetes. Testing should be considered at a younger age or be carried out more frequently if you are overweight and have at least 1 risk factor for diabetes.  Colorectal cancer can be detected and often prevented. Most routine colorectal cancer screening begins at the age of 71 and continues through age 44. However, your health care provider may recommend screening at an earlier age if you have risk factors for colon cancer. On a yearly basis, your health care provider may provide home test kits to check for hidden blood in the stool. A small camera at the end of a tube may be used to directly examine the colon (sigmoidoscopy or colonoscopy) to detect the earliest forms of colorectal cancer. Talk to your health care provider about this at age 92 when routine screening begins. A direct exam of the colon should be repeated every 5-10 years through age 17, unless early forms of precancerous polyps or small growths are found.  People who are at an increased risk for hepatitis B should be screened for this virus. You are considered at high risk for hepatitis B if:  You were born in a country where hepatitis B occurs often. Talk with your health care provider about which countries are considered high risk.  Your parents were born in a high-risk country and you have not received a shot to protect against hepatitis B (hepatitis B vaccine).  You have HIV or AIDS.  You use needles to inject street drugs.  You live with, or have sex with, someone who has hepatitis B.  You are a man who has sex with other men  (MSM).  You get hemodialysis treatment.  You take certain medicines for conditions like cancer, organ transplantation, and autoimmune conditions.  Hepatitis C blood testing is recommended for all people born from [birth year] through 1965 and any individual with known risk factors for hepatitis C.  Healthy men should no longer receive prostate-specific antigen (PSA) blood tests as part of routine cancer screening. Talk to your health care provider about prostate cancer screening.  Testicular cancer screening is not recommended for adolescents or adult males who have no symptoms. Screening includes self-exam, a health care provider exam, and other screening tests. Consult with your health care provider about any symptoms you have or any concerns you have about testicular cancer.  Practice safe sex. Use condoms and avoid high-risk sexual practices to reduce the spread of sexually transmitted infections (STIs).  You should be screened for STIs, including gonorrhea and chlamydia if:  You are sexually active and are younger than 24 years.  You are  older than 24 years, and your health care provider tells you that you are at risk for this type of infection.  Your sexual activity has changed since you were last screened, and you are at an increased risk for chlamydia or gonorrhea. Ask your health care provider if you are at risk.  If you are at risk of being infected with HIV, it is recommended that you take a prescription medicine daily to prevent HIV infection. This is called pre-exposure prophylaxis (PrEP). You are considered at risk if:  You are a man who has sex with other men (MSM).  You are a heterosexual man who is sexually active with multiple partners.  You take drugs by injection.  You are sexually active with a partner who has HIV.  Talk with your health care provider about whether you are at high risk of being infected with HIV. If you choose to begin PrEP, you should first be tested for  HIV. You should then be tested every 3 months for as long as you are taking PrEP.  Use sunscreen. Apply sunscreen liberally and repeatedly throughout the day. You should seek shade when your shadow is shorter than you. Protect yourself by wearing long sleeves, pants, a wide-brimmed hat, and sunglasses year round whenever you are outdoors.  Tell your health care provider of new moles or changes in moles, especially if there is a change in shape or color. Also, tell your health care provider if a mole is larger than the size of a pencil eraser.  A one-time screening for abdominal aortic aneurysm (AAA) and surgical repair of large AAAs by ultrasound is recommended for men aged 98-75 years who are current or former smokers.  Stay current with your vaccines (immunizations). Document Released: 04/25/2008 Document Revised: 11/02/2013 Document Reviewed: 03/25/2011 [facility] Patient Information 2015 [place], [place]. This information is not intended to replace advice given to you by your health care provider. Make sure you discuss any questions you have with your health care provider.

## 2014-10-26 ENCOUNTER — Ambulatory Visit (INDEPENDENT_AMBULATORY_CARE_PROVIDER_SITE_OTHER): Payer: Medicare Other | Admitting: Cardiology

## 2014-10-26 ENCOUNTER — Encounter: Payer: Self-pay | Admitting: Cardiology

## 2014-10-26 VITALS — BP 128/80 | HR 71 | Ht 71.0 in | Wt 201.0 lb

## 2014-10-26 DIAGNOSIS — E785 Hyperlipidemia, unspecified: Secondary | ICD-10-CM

## 2014-10-26 DIAGNOSIS — I2510 Atherosclerotic heart disease of native coronary artery without angina pectoris: Secondary | ICD-10-CM | POA: Diagnosis not present

## 2014-10-26 DIAGNOSIS — I10 Essential (primary) hypertension: Secondary | ICD-10-CM

## 2014-10-26 NOTE — Patient Instructions (Signed)
Your physician wants you to follow-up in: 1 year with Dr McLean. (December 2016). You will receive a reminder letter in the mail two months in advance. If you don't receive a letter, please call our office to schedule the follow-up appointment.  

## 2014-10-27 NOTE — Progress Notes (Signed)
Patient ID: Caleb Gray, male   DOB: 1940/11/25, 73 y.o.   MRN: [id number] PCP: Dr. Asa Lente  73 yo with history of CAD, colon cancer, HTN, and right nephrectomy presents for cardiology followup. Patient had pre-op testing before nephrectomy in 2008. Stress test was suggestive of coronary disease. He had never had chest pain or exertional dyspnea. Cardiac cath was done showing 70% mid LAD and 90% distal RCA. Given lack of symptoms, he was managed medically. Since that time, he has had no cardiac problems. He had a low risk myoview in 2009. He still has never had chest pain. He walks on occasion for exercise and golfs with no exertional dyspnea. Echo in 8/15 showed EF 60-65% with normal RV.  Main problem is chronic low back pain.  SBP running 120s-130s when he checks at home.   Labs (1/11): creatinine 1.4, K 4.5, HDL 37, LDL 160 (off Lipitor)  Labs (5/11): LDL 76, HDL 41  Labs (9/11): LDL 82, HDL 33.7  Labs (6/12): LDL 72, HDL 44 Labs (10/12): K 4.2, creatinine 1.34 Labs (6/13): LDL 50, HDL 43 Labs (10/13): K 4.3, creatinine 1.1, HCT 40.8 Labs (6/14): LDL 64, HDL 42 Labs (10/14): K 4.5, creatinine 1.4 Labs (12/15): K 4, creatinine 1.3, LDL 68, HDL 39  ECG: NSR, normal  SH: Married, retired, lives in [place], nonsmoker.   FH: CAD  ROS: All systems reviewed and negative except as per HPI.   Allergies (verified):  1) ! Penicillin   Past Medical History:  1. Colon cancer - adenoca 1997 s/p rectosigmoid resection.  2. Coronary artery disease - 2 vessel disease by cath in 4/08 in [place]: 70% mid LAD, 90% dist RCA managed medically; LVEF 55%. ETT-myoview 8/09 - fixed deficit at basal inferior segment (small), low risk. Echo (4/11): EF 55-60%, no regional wall motion abnormalities, moderate diastolic dysfunction, normal RV and normal PA pressure.  Echo (8/15) with EF 60-65%, mild LVH, normal RV size and systolic function.  3. PAD - Abnormal ABIs in 9/09 per patient. He says a further  workup was done (he is not sure what) and he was told things were "ok"  4. Diverticulitis, hx of  5. Hyperlipidemia  6. Hypertension  7. 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  8. Asthma  9. Low back pain s/p ruptured disc, had spine surgery in 12/10.   Current Outpatient Prescriptions  Medication Sig Dispense Refill  . albuterol (PROAIR HFA) 108 (90 BASE) MCG/ACT inhaler Inhale 2 puffs into the lungs 4 (four) times daily as needed. 72 g 0  . amLODipine (NORVASC) 10 MG tablet Take 1 tablet (10 mg total) by mouth daily. 90 tablet 3  . aspirin 81 MG tablet Take 81 mg by mouth daily.      Marland Kitchen atorvastatin (LIPITOR) 20 MG tablet Take 1 tablet (20 mg total) by mouth at bedtime. 90 tablet 3  . enalapril (VASOTEC) 20 MG tablet Take 1 tablet (20 mg total) by mouth daily. 90 tablet 3  . furosemide (LASIX) 20 MG tablet Take 1 tablet (20 mg total) by mouth daily as needed for fluid or edema. 30 tablet 3  . ibuprofen (ADVIL,MOTRIN) 800 MG tablet Take 1 tablet (800 mg total) by mouth 2 (two) times daily as needed. 90 tablet 1  . Multiple Vitamins-Minerals (CENTRUM SILVER) tablet Take 1 tablet by mouth daily.      . Omega-3 Fatty Acids (FISH OIL) 1000 MG CAPS Take by mouth 2 (two) times daily.  No current facility-administered medications for this visit.    BP 128/80 mmHg  Pulse 71  Ht 5\' 11"  (1.803 m)  Wt 201 lb (91.173 kg)  BMI 28.05 kg/m2 General: NAD Neck: No JVD, no thyromegaly or thyroid nodule.  Lungs: Clear to auscultation bilaterally with normal respiratory effort. CV: Nondisplaced PMI.  Heart regular S1/S2, +S4, no murmur.  No peripheral edema.  No carotid bruit.  Normal pedal pulses.  Abdomen: Soft, nontender, no hepatosplenomegaly, no distention.  Neurologic: Alert and oriented x 3.  Psych: Normal affect. Extremities: No clubbing or cyanosis.   Assessment/Plan:   HYPERLIPIDEMIA LDL was at goal (< 70) when recently checked.  CORONARY ARTERY DISEASE   Stable with no ischemic symptoms. Preserved EF on 8/15 echo. Continue ASA, statin, enalapril.  I would like to see him exercise more, we discussed this.  HYPERTENSION  BP is controlled.   Followup 1 year.   Loralie Champagne 10/27/2014

## 2014-12-20 ENCOUNTER — Encounter: Payer: Self-pay | Admitting: Oncology

## 2014-12-20 NOTE — Progress Notes (Signed)
Patient called and left a message about billing was filed wrong. I gave him billing ph# to call and let them know about dos back in 08/2014. His secondary was not billed.

## 2015-03-10 ENCOUNTER — Other Ambulatory Visit: Payer: Self-pay | Admitting: Internal Medicine

## 2015-05-08 ENCOUNTER — Ambulatory Visit: Payer: Medicare Other | Admitting: Internal Medicine

## 2015-05-08 ENCOUNTER — Other Ambulatory Visit: Payer: Self-pay

## 2015-05-08 MED ORDER — IBUPROFEN 800 MG PO TABS: 800.0000 mg | ORAL_TABLET | Freq: Two times a day (BID) | ORAL | Status: DC | PRN

## 2015-05-09 ENCOUNTER — Ambulatory Visit: Payer: Medicare Other | Admitting: Internal Medicine

## 2015-05-17 DIAGNOSIS — N281 Cyst of kidney, acquired: Secondary | ICD-10-CM | POA: Diagnosis not present

## 2015-06-26 DIAGNOSIS — H25813 Combined forms of age-related cataract, bilateral: Secondary | ICD-10-CM | POA: Diagnosis not present

## 2015-07-01 ENCOUNTER — Other Ambulatory Visit: Payer: Self-pay | Admitting: Internal Medicine

## 2015-07-07 ENCOUNTER — Encounter: Payer: Self-pay | Admitting: Family Medicine

## 2015-07-07 ENCOUNTER — Ambulatory Visit (INDEPENDENT_AMBULATORY_CARE_PROVIDER_SITE_OTHER): Payer: Medicare Other | Admitting: Family Medicine

## 2015-07-07 VITALS — BP 146/78 | HR 96 | Temp 98.4°F | Ht 71.0 in | Wt 200.0 lb

## 2015-07-07 DIAGNOSIS — L03113 Cellulitis of right upper limb: Secondary | ICD-10-CM | POA: Diagnosis not present

## 2015-07-07 MED ORDER — MUPIROCIN 2 % EX OINT: 1.0000 "application " | TOPICAL_OINTMENT | Freq: Every day | CUTANEOUS | Status: DC

## 2015-07-07 NOTE — Progress Notes (Signed)
Pre visit review using our clinic review tool, if applicable. No additional management support is needed unless otherwise documented below in the visit note. 

## 2015-07-07 NOTE — Progress Notes (Signed)
   Subjective:    Patient ID: Caleb Gray, male    DOB: 07-Jun-1941, 74 y.o.   MRN: [id number]  HPI  74 year old male patient of Dr. Katheren Puller with history of CAD, HTN presents with new onset sore on right lower arm. Scratched area earlier in week,? Bite...  Redness, swelling no pain, itchy. Redness has decreased in last 2 days, but some in crease in swelling.  Has been applying neosporin and bandage.  Clear discharge at site.  No fever, no flu like symptoms.    No history of skin problems.  No MRSA, no boiuls personally or in family.  no DM.   Review of Systems  Constitutional: Negative for fever and fatigue.  Respiratory: Negative for shortness of breath.   Cardiovascular: Negative for chest pain.       Objective:   Physical Exam  Constitutional: He appears well-developed and well-nourished.  HENT:  Head: Normocephalic.  Eyes: Conjunctivae are normal. Pupils are equal, round, and reactive to light.  Cardiovascular: Normal rate and regular rhythm.   No murmur heard. Pulmonary/Chest: Effort normal and breath sounds normal. He has no wheezes.  Skin:  rigth inner forearm with lesion 1 cm with additional 0.5 cm surrounding erythema, no dicsharge, no fluctuance, no pain, no warmth.          Assessment & Plan:

## 2015-07-07 NOTE — Patient Instructions (Addendum)
Can use zyrtec at bedtime for itching if needed.  Apply topical mupirocin ointment daily.  Keep area clean and dry.  Call if fever or if redness spreading on arm.

## 2015-07-07 NOTE — Assessment & Plan Note (Signed)
Healing  oin it own, unclear if infected scratch versus insect bite.. Will treat with mupriocin to assist with healing. Can use antihistamine for itching as needed.

## 2015-08-07 DIAGNOSIS — H25813 Combined forms of age-related cataract, bilateral: Secondary | ICD-10-CM | POA: Diagnosis not present

## 2015-08-09 ENCOUNTER — Encounter: Payer: Self-pay | Admitting: Internal Medicine

## 2015-08-09 ENCOUNTER — Other Ambulatory Visit (INDEPENDENT_AMBULATORY_CARE_PROVIDER_SITE_OTHER): Payer: Medicare Other

## 2015-08-09 ENCOUNTER — Ambulatory Visit (INDEPENDENT_AMBULATORY_CARE_PROVIDER_SITE_OTHER): Payer: Medicare Other | Admitting: Internal Medicine

## 2015-08-09 VITALS — BP 138/70 | HR 82 | Temp 97.7°F | Ht 71.0 in | Wt 201.5 lb

## 2015-08-09 DIAGNOSIS — I2510 Atherosclerotic heart disease of native coronary artery without angina pectoris: Secondary | ICD-10-CM

## 2015-08-09 DIAGNOSIS — E785 Hyperlipidemia, unspecified: Secondary | ICD-10-CM

## 2015-08-09 DIAGNOSIS — M5441 Lumbago with sciatica, right side: Secondary | ICD-10-CM

## 2015-08-09 DIAGNOSIS — I10 Essential (primary) hypertension: Secondary | ICD-10-CM | POA: Diagnosis not present

## 2015-08-09 DIAGNOSIS — Z23 Encounter for immunization: Secondary | ICD-10-CM | POA: Diagnosis not present

## 2015-08-09 LAB — CBC WITH DIFFERENTIAL/PLATELET
BASOS PCT: 1 % (ref 0.0–3.0)
Basophils Absolute: 0.1 10*3/uL (ref 0.0–0.1)
EOS PCT: 5 % (ref 0.0–5.0)
Eosinophils Absolute: 0.4 10*3/uL (ref 0.0–0.7)
HEMATOCRIT: 42.7 % (ref 39.0–52.0)
HEMOGLOBIN: 14.4 g/dL (ref 13.0–17.0)
Lymphocytes Relative: 23.8 % (ref 12.0–46.0)
Lymphs Abs: 1.8 10*3/uL (ref 0.7–4.0)
MCHC: 33.6 g/dL (ref 30.0–36.0)
MCV: 96.6 fl (ref 78.0–100.0)
MONO ABS: 0.6 10*3/uL (ref 0.1–1.0)
MONOS PCT: 8.6 % (ref 3.0–12.0)
Neutro Abs: 4.6 10*3/uL (ref 1.4–7.7)
Neutrophils Relative %: 61.6 % (ref 43.0–77.0)
Platelets: 233 10*3/uL (ref 150.0–400.0)
RBC: 4.43 Mil/uL (ref 4.22–5.81)
RDW: 13.5 % (ref 11.5–15.5)
WBC: 7.4 10*3/uL (ref 4.0–10.5)

## 2015-08-09 LAB — URINALYSIS, ROUTINE W REFLEX MICROSCOPIC
BILIRUBIN URINE: NEGATIVE
Hgb urine dipstick: NEGATIVE
KETONES UR: NEGATIVE
LEUKOCYTES UA: NEGATIVE
NITRITE: NEGATIVE
RBC / HPF: NONE SEEN (ref 0–?)
SPECIFIC GRAVITY, URINE: 1.02 (ref 1.000–1.030)
Total Protein, Urine: NEGATIVE
URINE GLUCOSE: NEGATIVE
UROBILINOGEN UA: 0.2 (ref 0.0–1.0)
WBC UA: NONE SEEN (ref 0–?)
pH: 6 (ref 5.0–8.0)

## 2015-08-09 LAB — HEPATIC FUNCTION PANEL
ALBUMIN: 4.1 g/dL (ref 3.5–5.2)
ALT: 34 U/L (ref 0–53)
AST: 25 U/L (ref 0–37)
Alkaline Phosphatase: 83 U/L (ref 39–117)
Bilirubin, Direct: 0.2 mg/dL (ref 0.0–0.3)
TOTAL PROTEIN: 6.7 g/dL (ref 6.0–8.3)
Total Bilirubin: 0.9 mg/dL (ref 0.2–1.2)

## 2015-08-09 LAB — LIPID PANEL
CHOLESTEROL: 114 mg/dL (ref 0–200)
HDL: 38.5 mg/dL — ABNORMAL LOW (ref >39.00)
LDL CALC: 61 mg/dL (ref 0–99)
NonHDL: 75.08
TRIGLYCERIDES: 71 mg/dL (ref 0.0–149.0)
Total CHOL/HDL Ratio: 3
VLDL: 14.2 mg/dL (ref 0.0–40.0)

## 2015-08-09 LAB — BASIC METABOLIC PANEL
BUN: 21 mg/dL (ref 6–23)
CHLORIDE: 107 meq/L (ref 96–112)
CO2: 29 mEq/L (ref 19–32)
Calcium: 9.5 mg/dL (ref 8.4–10.5)
Creatinine, Ser: 1.38 mg/dL (ref 0.40–1.50)
GFR: 53.52 mL/min — AB (ref >60.00)
Glucose, Bld: 117 mg/dL — ABNORMAL HIGH (ref 70–99)
POTASSIUM: 4.2 meq/L (ref 3.5–5.1)
SODIUM: 141 meq/L (ref 135–145)

## 2015-08-09 LAB — TSH: TSH: 1.11 u[IU]/mL (ref 0.35–4.50)

## 2015-08-09 MED ORDER — ENALAPRIL MALEATE 20 MG PO TABS
20.0000 mg | ORAL_TABLET | Freq: Every day | ORAL | Status: DC
Start: 2015-08-09 — End: 2016-07-20

## 2015-08-09 MED ORDER — AMLODIPINE BESYLATE 10 MG PO TABS: 10.0000 mg | ORAL_TABLET | Freq: Every day | ORAL | Status: DC

## 2015-08-09 NOTE — Patient Instructions (Signed)
It was good to see you today.  We have reviewed your prior records including labs and tests today  Annual flu shot administered today  Test(s) ordered today. Your results will be released to [place] (or called to you) after review, usually within 72hours after test completion. If any changes need to be made, you will be notified at that same time.  Medications reviewed and updated, no changes recommended at this time. Refill on medication(s) as discussed today.  We'll assist with follow-up cardiology as discussed  Please schedule followup in 6 months, call sooner if problems.

## 2015-08-09 NOTE — Progress Notes (Signed)
Subjective:    Patient ID: Caleb Gray, male    DOB: January 30, 1941, 74 y.o.   MRN: [id number]  HPI  Patient here for follow up  Past Medical History  Diagnosis Date  . CORONARY ARTERY DISEASE   . ANEMIA-NOS   . ASTHMA   . Colon cancer     [place] 1997 stage III T3, N1 s/p rectosigmoid resection  . HYPERLIPIDEMIA   . HYPERTENSION   . Chronic diastolic heart failure   . UNSPECIFIED PERIPHERAL VASCULAR DISEASE   . Low back pain 10/2009    s/p laminectomy/decompression L4-5  . Right kidney mass 02/2007    benign (oncocytoma) s/p resection/total nephrectomy    Review of Systems  Constitutional: Negative for fatigue and unexpected weight change.  Respiratory: Negative for cough and shortness of breath.   Cardiovascular: Negative for chest pain and leg swelling.  Musculoskeletal: Positive for back pain.       Objective:    Physical Exam  Constitutional: He appears well-developed and well-nourished. No distress.  Cardiovascular: Normal rate, regular rhythm and normal heart sounds.   No murmur heard. Pulmonary/Chest: Effort normal and breath sounds normal. No respiratory distress.    BP 138/70 mmHg  Pulse 82  Temp(Src) 97.7 F (36.5 C) (Oral)  Ht 5\' 11"  (1.803 m)  Wt 201 lb 8 oz (91.4 kg)  BMI 28.12 kg/m2  SpO2 97% Wt Readings from Last 3 Encounters:  08/09/15 201 lb 8 oz (91.4 kg)  07/07/15 200 lb (90.719 kg)  10/26/14 201 lb (91.173 kg)     Lab Results  Component Value Date   WBC 8.4 09/01/2014   HGB 13.5 09/01/2014   HCT 40.5 09/01/2014   PLT 242 09/01/2014   GLUCOSE 113* 10/19/2014   CHOL 123 10/19/2014   TRIG 79.0 10/19/2014   HDL 39.10 10/19/2014   LDLDIRECT 159.7 11/28/2009   LDLCALC 68 10/19/2014   ALT 40 09/01/2014   AST 29 09/01/2014   NA 137 10/19/2014   K 4.0 10/19/2014   CL 105 10/19/2014   CREATININE 1.3 10/19/2014   BUN 18 10/19/2014   CO2 25 10/19/2014   TSH 1.44 06/21/2014   INR 1.0 ratio 10/27/2009   HGBA1C 5.9 03/08/2009     Dg Chest 2 View  11/02/2012   *RADIOLOGY REPORT*  Clinical Data: Cough, wheezing, shortness of breath, former smoking history  CHEST - 2 VIEW  Comparison: None.  Findings: No active infiltrate or effusion is seen.  There is some peribronchial thickening most consistent with bronchitis, possibly chronic.  Mediastinal contours appear normal.  The heart is within upper limits of normal.  No bony abnormality is seen.  IMPRESSION: No pneumonia.  Peribronchial thickening may indicate bronchitis.   Original Report Authenticated By: Ivar Drape, M.D.       Assessment & Plan:   Problem List Items Addressed This Visit    Coronary atherosclerosis - Primary    No overt anginal symptoms reported reports last stress echo 2008 in [place], [place] with cards annually, last OV reviewed Continue medical management with aspirin, statin and ACE inhibitor      Relevant Medications   enalapril (VASOTEC) 20 MG tablet   amLODipine (NORVASC) 10 MG tablet   Other Relevant Orders   Basic metabolic panel   CBC with Differential/Platelet   Hepatic function panel   Lipid panel   TSH   Dyslipidemia    Lipitor increased 11/2010 from 10qd to 20qd to get LDL<70 Recheck now - adjust prn  Relevant Orders   Lipid panel   Essential hypertension    The current medical regimen is generally effective (per report of home BPs) Medications reviewed - improved control since increased amlodipine 09/2013 by cards Educated to minimize NSAIDs due to new edema in 03/2014, resolved at this time with diet changes and less ibuprofen   BP Readings from Last 3 Encounters:  08/09/15 138/70  07/07/15 146/78  10/26/14 128/80        Relevant Medications   enalapril (VASOTEC) 20 MG tablet   amLODipine (NORVASC) 10 MG tablet   Other Relevant Orders   Basic metabolic panel   Lipid panel   Urinalysis, Routine w reflex microscopic (not at [facility])   [place]    S/p L4-5 laminectomy/decompression 10/2009 - mild intermittent  residual back issues  initial leg pain R side resolved after surgery, but now recurring pain in low back, R hip and weakness on RLE s/p eval/tx by PMR and PT (spine and scoli specialists) - continue same as needed w/ Saullo Alternate tylenol and NSAIDs as ongoing for pain relief as needed Caryl Comes needs for any stronger narcotic today          Gwendolyn Grant, MD

## 2015-08-09 NOTE — Assessment & Plan Note (Signed)
S/p L4-5 laminectomy/decompression 10/2009 - mild intermittent residual back issues  initial leg pain R side resolved after surgery, but now recurring pain in low back, R hip and weakness on RLE s/p eval/tx by PMR and PT (spine and scoli specialists) - continue same as needed w/ Saullo Alternate tylenol and NSAIDs as ongoing for pain relief as needed -Caleb Gray needs for any stronger narcotic today

## 2015-08-09 NOTE — Assessment & Plan Note (Signed)
Lipitor increased 11/2010 from 10qd to 20qd to get LDL<70 Recheck now - adjust prn

## 2015-08-09 NOTE — Assessment & Plan Note (Signed)
No overt anginal symptoms reported reports last stress echo 2008 in [place], [place] with cards annually, last OV reviewed Continue medical management with aspirin, statin and ACE inhibitor

## 2015-08-09 NOTE — Progress Notes (Signed)
Pre visit review using our clinic review tool, if applicable. No additional management support is needed unless otherwise documented below in the visit note. 

## 2015-08-09 NOTE — Assessment & Plan Note (Signed)
The current medical regimen is generally effective (per report of home BPs) Medications reviewed - improved control since increased amlodipine 09/2013 by cards Educated to minimize NSAIDs due to new edema in 03/2014, resolved at this time with diet changes and less ibuprofen   BP Readings from Last 3 Encounters:  08/09/15 138/70  07/07/15 146/78  10/26/14 128/80

## 2015-09-06 DIAGNOSIS — H2512 Age-related nuclear cataract, left eye: Secondary | ICD-10-CM | POA: Diagnosis not present

## 2015-09-06 DIAGNOSIS — H52222 Regular astigmatism, left eye: Secondary | ICD-10-CM | POA: Diagnosis not present

## 2015-09-20 DIAGNOSIS — H52221 Regular astigmatism, right eye: Secondary | ICD-10-CM | POA: Diagnosis not present

## 2015-09-20 DIAGNOSIS — H2511 Age-related nuclear cataract, right eye: Secondary | ICD-10-CM | POA: Diagnosis not present

## 2015-09-21 ENCOUNTER — Other Ambulatory Visit: Payer: Self-pay | Admitting: Internal Medicine

## 2015-11-09 ENCOUNTER — Encounter: Payer: Self-pay | Admitting: *Deleted

## 2015-12-01 ENCOUNTER — Encounter: Payer: Self-pay | Admitting: Cardiology

## 2015-12-01 ENCOUNTER — Ambulatory Visit (INDEPENDENT_AMBULATORY_CARE_PROVIDER_SITE_OTHER): Payer: Medicare Other | Admitting: Cardiology

## 2015-12-01 VITALS — BP 138/82 | HR 77 | Ht 71.0 in | Wt 201.0 lb

## 2015-12-01 DIAGNOSIS — I2510 Atherosclerotic heart disease of native coronary artery without angina pectoris: Secondary | ICD-10-CM | POA: Diagnosis not present

## 2015-12-01 DIAGNOSIS — E785 Hyperlipidemia, unspecified: Secondary | ICD-10-CM

## 2015-12-01 DIAGNOSIS — I10 Essential (primary) hypertension: Secondary | ICD-10-CM | POA: Diagnosis not present

## 2015-12-01 NOTE — Patient Instructions (Signed)

## 2015-12-03 NOTE — Progress Notes (Signed)
Patient ID: Caleb Gray, male   DOB: [birth year], 75 y.o.   MRN: [id number] PCP: Dr. Silvio Pate  75 yo with history of CAD, colon cancer, HTN, and right nephrectomy presents for cardiology followup. Patient had pre-op testing before nephrectomy in 2008. Stress test was suggestive of coronary disease. He had never had chest pain or exertional dyspnea. Cardiac cath was done showing 70% mid LAD and 90% distal RCA. Given lack of symptoms, he was managed medically. Since that time, he has had no cardiac problems. He had a low risk myoview in 2009. He still has never had chest pain. He walks on occasion for exercise and golfs with no exertional dyspnea. Echo in 8/15 showed EF 60-65% with normal RV.  Main problem is chronic low back pain.  BP controlled.   Labs (1/11): creatinine 1.4, K 4.5, HDL 37, LDL 160 (off Lipitor)  Labs (5/11): LDL 76, HDL 41  Labs (9/11): LDL 82, HDL 33.7  Labs (6/12): LDL 72, HDL 44 Labs (10/12): K 4.2, creatinine 1.34 Labs (6/13): LDL 50, HDL 43 Labs (10/13): K 4.3, creatinine 1.1, HCT 40.8 Labs (6/14): LDL 64, HDL 42 Labs (10/14): K 4.5, creatinine 1.4 Labs (12/15): K 4, creatinine 1.3, LDL 68, HDL 39 Labs (9/16): K 4.2, creatinine 1.38, LDL 61, HDL 39, HCT 42.7  ECG: NSR, PAC  SH: Married, retired, lives in [place], nonsmoker.   FH: CAD  ROS: All systems reviewed and negative except as per HPI.   Allergies (verified):  1) ! Penicillin   Past Medical History:  1. Colon cancer - adenoca 1997 s/p rectosigmoid resection.  2. Coronary artery disease - 2 vessel disease by cath in 4/08 in [place]: 70% mid LAD, 90% dist RCA managed medically; LVEF 55%. ETT-myoview 8/09 - fixed deficit at basal inferior segment (small), low risk. Echo (4/11): EF 55-60%, no regional wall motion abnormalities, moderate diastolic dysfunction, normal RV and normal PA pressure.  Echo (8/15) with EF 60-65%, mild LVH, normal RV size and systolic function.  3. PAD - Abnormal ABIs in 9/09 per  patient. He says a further workup was done (he is not sure what) and he was told things were "ok"  4. Diverticulitis, hx of  5. Hyperlipidemia  6. Hypertension  7. 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  8. Asthma  9. Low back pain s/p ruptured disc, had spine surgery in 12/10.   Current Outpatient Prescriptions  Medication Sig Dispense Refill  . amLODipine (NORVASC) 10 MG tablet Take 1 tablet (10 mg total) by mouth daily. 90 tablet 3  . aspirin 81 MG tablet Take 81 mg by mouth daily.      Marland Kitchen atorvastatin (LIPITOR) 20 MG tablet Take 1 tablet (20 mg total) by mouth at bedtime. 90 tablet 1  . enalapril (VASOTEC) 20 MG tablet Take 1 tablet (20 mg total) by mouth daily. 90 tablet 3  . ibuprofen (ADVIL,MOTRIN) 800 MG tablet Take 1 tablet (800 mg total) by mouth 2 (two) times daily. Est with new PCP for additional refills. 90 tablet 1  . Multiple Vitamins-Minerals (CENTRUM SILVER) tablet Take 1 tablet by mouth daily.      . Omega-3 Fatty Acids (FISH OIL) 1000 MG CAPS Take by mouth 2 (two) times daily.     Marland Kitchen PROAIR HFA 108 (90 BASE) MCG/ACT inhaler USE 2 INHALATIONS FOUR TIMES A DAY AS NEEDED 54 g 1   No current facility-administered medications for this visit.    BP 138/82 mmHg  Pulse  77  Ht 5\' 11"  (1.803 m)  Wt 201 lb (91.173 kg)  BMI 28.05 kg/m2 General: NAD Neck: No JVD, no thyromegaly or thyroid nodule.  Lungs: Clear to auscultation bilaterally with normal respiratory effort. CV: Nondisplaced PMI.  Heart regular S1/S2, no S3/S4, no murmur.  No peripheral edema.  No carotid bruit.  Normal pedal pulses.  Abdomen: Soft, nontender, no hepatosplenomegaly, no distention.  Neurologic: Alert and oriented x 3.  Psych: Normal affect. Extremities: No clubbing or cyanosis.   Assessment/Plan:   HYPERLIPIDEMIA LDL was at goal (< 70) when checked in 9/16.  CORONARY ARTERY DISEASE  Stable with no ischemic symptoms. Preserved EF on 8/15 echo. Continue ASA, statin,  enalapril.  HYPERTENSION  BP is controlled.   Followup 1 year.   Loralie Champagne 12/03/2015

## 2015-12-08 DIAGNOSIS — D225 Melanocytic nevi of trunk: Secondary | ICD-10-CM | POA: Diagnosis not present

## 2015-12-08 DIAGNOSIS — L820 Inflamed seborrheic keratosis: Secondary | ICD-10-CM | POA: Diagnosis not present

## 2015-12-08 DIAGNOSIS — D2270 Melanocytic nevi of unspecified lower limb, including hip: Secondary | ICD-10-CM | POA: Diagnosis not present

## 2015-12-08 DIAGNOSIS — L821 Other seborrheic keratosis: Secondary | ICD-10-CM | POA: Diagnosis not present

## 2015-12-08 DIAGNOSIS — D1801 Hemangioma of skin and subcutaneous tissue: Secondary | ICD-10-CM | POA: Diagnosis not present

## 2015-12-08 DIAGNOSIS — Z85828 Personal history of other malignant neoplasm of skin: Secondary | ICD-10-CM | POA: Diagnosis not present

## 2015-12-08 DIAGNOSIS — L814 Other melanin hyperpigmentation: Secondary | ICD-10-CM | POA: Diagnosis not present

## 2015-12-08 DIAGNOSIS — D2260 Melanocytic nevi of unspecified upper limb, including shoulder: Secondary | ICD-10-CM | POA: Diagnosis not present

## 2016-01-01 ENCOUNTER — Telehealth: Payer: Self-pay | Admitting: Internal Medicine

## 2016-01-01 MED ORDER — ATORVASTATIN CALCIUM 20 MG PO TABS: 20.0000 mg | ORAL_TABLET | Freq: Every day | ORAL | Status: DC

## 2016-01-01 NOTE — Telephone Encounter (Signed)
Former patient of Dr. Asa Lente Pt has an appointment with Dr. Silvio Pate on 02/07/16 but in the meantime he needs his med refilled for Atorvastatin Please use [facility] if we are able to fill this for the patient

## 2016-01-01 NOTE — Telephone Encounter (Signed)
rx sent to pharmacy by [facility]  

## 2016-01-01 NOTE — Telephone Encounter (Signed)
Okay to give him #[phone] x 3

## 2016-02-07 ENCOUNTER — Encounter: Payer: Self-pay | Admitting: Internal Medicine

## 2016-02-07 ENCOUNTER — Ambulatory Visit: Payer: PRIVATE HEALTH INSURANCE | Admitting: Internal Medicine

## 2016-02-07 ENCOUNTER — Ambulatory Visit (INDEPENDENT_AMBULATORY_CARE_PROVIDER_SITE_OTHER): Payer: Medicare Other | Admitting: Internal Medicine

## 2016-02-07 VITALS — BP 148/78 | HR 92 | Temp 98.0°F | Ht 71.0 in | Wt 202.0 lb

## 2016-02-07 DIAGNOSIS — I2510 Atherosclerotic heart disease of native coronary artery without angina pectoris: Secondary | ICD-10-CM

## 2016-02-07 DIAGNOSIS — J4520 Mild intermittent asthma, uncomplicated: Secondary | ICD-10-CM | POA: Diagnosis not present

## 2016-02-07 DIAGNOSIS — I739 Peripheral vascular disease, unspecified: Secondary | ICD-10-CM | POA: Diagnosis not present

## 2016-02-07 DIAGNOSIS — I10 Essential (primary) hypertension: Secondary | ICD-10-CM | POA: Diagnosis not present

## 2016-02-07 LAB — CBC WITH DIFFERENTIAL/PLATELET
BASOS PCT: 0.7 % (ref 0.0–3.0)
Basophils Absolute: 0.1 10*3/uL (ref 0.0–0.1)
EOS ABS: 0.3 10*3/uL (ref 0.0–0.7)
Eosinophils Relative: 4.7 % (ref 0.0–5.0)
HEMATOCRIT: 41.8 % (ref 39.0–52.0)
Hemoglobin: 14.4 g/dL (ref 13.0–17.0)
LYMPHS ABS: 1.9 10*3/uL (ref 0.7–4.0)
LYMPHS PCT: 26.5 % (ref 12.0–46.0)
MCHC: 34.4 g/dL (ref 30.0–36.0)
MCV: 94.3 fl (ref 78.0–100.0)
Monocytes Absolute: 0.6 10*3/uL (ref 0.1–1.0)
Monocytes Relative: 7.8 % (ref 3.0–12.0)
NEUTROS ABS: 4.3 10*3/uL (ref 1.4–7.7)
NEUTROS PCT: 60.3 % (ref 43.0–77.0)
PLATELETS: 229 10*3/uL (ref 150.0–400.0)
RBC: 4.43 Mil/uL (ref 4.22–5.81)
RDW: 13.2 % (ref 11.5–15.5)
WBC: 7.1 10*3/uL (ref 4.0–10.5)

## 2016-02-07 LAB — COMPREHENSIVE METABOLIC PANEL
ALT: 37 U/L (ref 0–53)
AST: 29 U/L (ref 0–37)
Albumin: 4.2 g/dL (ref 3.5–5.2)
Alkaline Phosphatase: 83 U/L (ref 39–117)
BUN: 21 mg/dL (ref 6–23)
CALCIUM: 9.8 mg/dL (ref 8.4–10.5)
CHLORIDE: 106 meq/L (ref 96–112)
CO2: 30 meq/L (ref 19–32)
CREATININE: 1.4 mg/dL (ref 0.40–1.50)
GFR: 52.57 mL/min — ABNORMAL LOW (ref >60.00)
GLUCOSE: 127 mg/dL — AB (ref 70–99)
Potassium: 4.1 mEq/L (ref 3.5–5.1)
SODIUM: 140 meq/L (ref 135–145)
Total Bilirubin: 0.8 mg/dL (ref 0.2–1.2)
Total Protein: 7 g/dL (ref 6.0–8.3)

## 2016-02-07 LAB — LIPID PANEL
CHOL/HDL RATIO: 3
Cholesterol: 127 mg/dL (ref 0–200)
HDL: 42.4 mg/dL (ref >39.00)
LDL CALC: 63 mg/dL (ref 0–99)
NonHDL: 84.13
Triglycerides: 105 mg/dL (ref 0.0–149.0)
VLDL: 21 mg/dL (ref 0.0–40.0)

## 2016-02-07 LAB — T4, FREE: Free T4: 1.09 ng/dL (ref 0.60–1.60)

## 2016-02-07 NOTE — Assessment & Plan Note (Signed)
BP Readings from Last 3 Encounters:  02/07/16 148/78  12/01/15 138/82  08/09/15 138/70   Gets good readings at home ?elevated due to first visit No change for now Puffy ankles from amlodipine--no reason to change

## 2016-02-07 NOTE — Progress Notes (Signed)
Subjective:    Patient ID: Caleb Gray, male    DOB: [birth year], 75 y.o.   MRN: [id number]  HPI Here to establish care since Dr Asa Lente is not seeing patients  Long standing HTN Well controlled and no problems with meds No dizziness or syncope No chest pain No SOB No exercise--- discussed (slacked off) Checks BP 2-3 times a week. Usually 120-130/70-80  On statin No myalgias or GI problems on statin Has known CAD on cath---but never symptoms Abnormal ABIs--- doesn't remember claudication when more active  Ongoing back problems Past back surgery He doesn't think this is limiting Ibuprofen 800mg  daily (or occ bid)  Mild asthma Takes pro air regularly bid Not sure he needs it  Current Outpatient Prescriptions on File Prior to Visit  Medication Sig Dispense Refill  . amLODipine (NORVASC) 10 MG tablet Take 1 tablet (10 mg total) by mouth daily. 90 tablet 3  . aspirin 81 MG tablet Take 81 mg by mouth daily.      Marland Kitchen atorvastatin (LIPITOR) 20 MG tablet Take 1 tablet (20 mg total) by mouth at bedtime. 90 tablet 0  . enalapril (VASOTEC) 20 MG tablet Take 1 tablet (20 mg total) by mouth daily. 90 tablet 3  . ibuprofen (ADVIL,MOTRIN) 800 MG tablet Take 1 tablet (800 mg total) by mouth 2 (two) times daily. Est with new PCP for additional refills. 90 tablet 1  . Multiple Vitamins-Minerals (CENTRUM SILVER) tablet Take 1 tablet by mouth daily.      . Omega-3 Fatty Acids (FISH OIL) 1000 MG CAPS Take by mouth 2 (two) times daily.     Marland Kitchen PROAIR HFA 108 (90 BASE) MCG/ACT inhaler USE 2 INHALATIONS FOUR TIMES A DAY AS NEEDED 54 g 1   No current facility-administered medications on file prior to visit.    Allergies  Allergen Reactions  . Penicillins     Very dry and itchy skin, make throat "feel funny:    Past Medical History  Diagnosis Date  . CORONARY ARTERY DISEASE   . ANEMIA-NOS   . ASTHMA   . Colon cancer ([place])     [place] 1997 stage III T3, N1 s/p rectosigmoid resection    . HYPERLIPIDEMIA   . HYPERTENSION   . Chronic diastolic heart failure ([place])   . UNSPECIFIED PERIPHERAL VASCULAR DISEASE   . Low back pain 10/2009    s/p laminectomy/decompression L4-5  . Right kidney mass 02/2007    benign (oncocytoma) s/p resection/total nephrectomy    Past Surgical History  Procedure Laterality Date  . Nephrectomy  02/2007    Right  . Rectosigmoidectomy  1997  . Lumbar laminectomy/decompression microdiscectomy  10/2009    Family History  Problem Relation Age of Onset  . Colon cancer Mother   . Heart disease Father   . Alcohol abuse Other     Parents  . Esophageal cancer Neg Hx   . Rectal cancer Neg Hx   . Stomach cancer Neg Hx     Social History   Social History  . Marital Status: Married    Spouse Name: N/A  . Number of Children: 2  . Years of Education: N/A   Occupational History  . IT--systems      retired   Social History Main Topics  . Smoking status: Former Smoker    Types: Cigarettes    Quit date: 11/11/1968  . Smokeless tobacco: Never Used  . Alcohol Use: 0.0 oz/week    0 Standard drinks or equivalent per  week     Comment: occasionally  . Drug Use: Not on file  . Sexual Activity: Not on file   Other Topics Concern  . Not on file   Social History Narrative   Married, lives with wife, retired in 2007. Moved from [place] to [place] 04/2009      Has living will    Wife is health care POA   Would accept resuscitation   Not sure about tube feeds---might accept   Review of Systems No falls No depression or anhedonia. Some stress with family--feels he is handling it Usually sleeps okay--falls asleep in front of TV, then occ trouble reinitiating Bowels okay Slow stream with urine--not bad enough for meds    Objective:   Physical Exam  Constitutional: He is oriented to person, place, and time. He appears well-developed and well-nourished. No distress.  Neck: Normal range of motion. Neck supple. No thyromegaly present.   Cardiovascular: Normal rate, regular rhythm, normal heart sounds and intact distal pulses.  Exam reveals no gallop.   No murmur heard. Pulmonary/Chest: Effort normal and breath sounds normal. No respiratory distress. He has no wheezes. He has no rales.  Abdominal: Soft. There is no tenderness.  Musculoskeletal: He exhibits no edema or tenderness.  Lymphadenopathy:    He has no cervical adenopathy.  Neurological: He is alert and oriented to person, place, and time.  Psychiatric: He has a normal mood and affect. His behavior is normal.          Assessment & Plan:

## 2016-02-07 NOTE — Progress Notes (Signed)
Pre visit review using our clinic review tool, if applicable. No additional management support is needed unless otherwise documented below in the visit note. 

## 2016-02-07 NOTE — Assessment & Plan Note (Signed)
No apparent symptoms Asked him to use the proair only prn

## 2016-02-07 NOTE — Assessment & Plan Note (Signed)
No symptoms On appropriate regimen and follows with cardiology

## 2016-02-07 NOTE — Assessment & Plan Note (Signed)
Abnormal ABIs in past Only smoked till 28 No symptoms Observe only

## 2016-03-07 ENCOUNTER — Other Ambulatory Visit (INDEPENDENT_AMBULATORY_CARE_PROVIDER_SITE_OTHER): Payer: Medicare Other

## 2016-03-07 DIAGNOSIS — R7309 Other abnormal glucose: Secondary | ICD-10-CM | POA: Diagnosis not present

## 2016-03-07 LAB — GLUCOSE, RANDOM: GLUCOSE: 114 mg/dL — AB (ref 70–99)

## 2016-03-07 LAB — HEMOGLOBIN A1C: Hgb A1c MFr Bld: 6 % (ref 4.6–6.5)

## 2016-03-16 ENCOUNTER — Other Ambulatory Visit: Payer: Self-pay | Admitting: Internal Medicine

## 2016-03-16 MED ORDER — ATORVASTATIN CALCIUM 20 MG PO TABS: 20.0000 mg | ORAL_TABLET | Freq: Every day | ORAL | Status: DC

## 2016-04-15 ENCOUNTER — Other Ambulatory Visit: Payer: Self-pay | Admitting: Internal Medicine

## 2016-06-24 ENCOUNTER — Encounter: Payer: Self-pay | Admitting: Internal Medicine

## 2016-06-24 MED ORDER — ALBUTEROL SULFATE HFA 108 (90 BASE) MCG/ACT IN AERS: INHALATION_SPRAY | RESPIRATORY_TRACT | 3 refills | Status: DC

## 2016-06-24 NOTE — Telephone Encounter (Signed)
Rx sent electronically. MyChart Message sent to pt

## 2016-07-19 DIAGNOSIS — D3610 Benign neoplasm of peripheral nerves and autonomic nervous system, unspecified: Secondary | ICD-10-CM | POA: Diagnosis not present

## 2016-07-19 DIAGNOSIS — L738 Other specified follicular disorders: Secondary | ICD-10-CM | POA: Diagnosis not present

## 2016-07-19 DIAGNOSIS — D1800 Hemangioma unspecified site: Secondary | ICD-10-CM | POA: Diagnosis not present

## 2016-07-19 DIAGNOSIS — D225 Melanocytic nevi of trunk: Secondary | ICD-10-CM | POA: Diagnosis not present

## 2016-07-19 DIAGNOSIS — L820 Inflamed seborrheic keratosis: Secondary | ICD-10-CM | POA: Diagnosis not present

## 2016-07-19 DIAGNOSIS — Z85828 Personal history of other malignant neoplasm of skin: Secondary | ICD-10-CM | POA: Diagnosis not present

## 2016-07-19 DIAGNOSIS — L573 Poikiloderma of Civatte: Secondary | ICD-10-CM | POA: Diagnosis not present

## 2016-07-19 DIAGNOSIS — I781 Nevus, non-neoplastic: Secondary | ICD-10-CM | POA: Diagnosis not present

## 2016-07-19 DIAGNOSIS — Z1283 Encounter for screening for malignant neoplasm of skin: Secondary | ICD-10-CM | POA: Diagnosis not present

## 2016-07-19 DIAGNOSIS — L814 Other melanin hyperpigmentation: Secondary | ICD-10-CM | POA: Diagnosis not present

## 2016-07-19 DIAGNOSIS — L821 Other seborrheic keratosis: Secondary | ICD-10-CM | POA: Diagnosis not present

## 2016-07-19 DIAGNOSIS — D2270 Melanocytic nevi of unspecified lower limb, including hip: Secondary | ICD-10-CM | POA: Diagnosis not present

## 2016-07-20 ENCOUNTER — Encounter: Payer: Self-pay | Admitting: Internal Medicine

## 2016-07-20 MED ORDER — ENALAPRIL MALEATE 20 MG PO TABS: 20.0000 mg | ORAL_TABLET | Freq: Every day | ORAL | 1 refills | Status: DC

## 2016-08-20 DIAGNOSIS — Z23 Encounter for immunization: Secondary | ICD-10-CM | POA: Diagnosis not present

## 2016-08-30 ENCOUNTER — Other Ambulatory Visit: Payer: Self-pay | Admitting: Internal Medicine

## 2016-08-31 ENCOUNTER — Encounter: Payer: Self-pay | Admitting: Internal Medicine

## 2016-09-02 MED ORDER — AMLODIPINE BESYLATE 10 MG PO TABS: 10.0000 mg | ORAL_TABLET | Freq: Every day | ORAL | 3 refills | Status: DC

## 2016-10-14 DIAGNOSIS — H26493 Other secondary cataract, bilateral: Secondary | ICD-10-CM | POA: Diagnosis not present

## 2016-10-14 DIAGNOSIS — H524 Presbyopia: Secondary | ICD-10-CM | POA: Diagnosis not present

## 2016-11-20 ENCOUNTER — Encounter: Payer: Self-pay | Admitting: Internal Medicine

## 2016-11-20 ENCOUNTER — Ambulatory Visit (INDEPENDENT_AMBULATORY_CARE_PROVIDER_SITE_OTHER): Payer: Medicare Other | Admitting: Internal Medicine

## 2016-11-20 DIAGNOSIS — H9122 Sudden idiopathic hearing loss, left ear: Secondary | ICD-10-CM | POA: Diagnosis not present

## 2016-11-20 DIAGNOSIS — H90A12 Conductive hearing loss, unilateral, left ear with restricted hearing on the contralateral side: Secondary | ICD-10-CM | POA: Diagnosis not present

## 2016-11-20 DIAGNOSIS — H903 Sensorineural hearing loss, bilateral: Secondary | ICD-10-CM | POA: Diagnosis not present

## 2016-11-20 MED ORDER — VALACYCLOVIR HCL 1 G PO TABS: 1000.0000 mg | ORAL_TABLET | Freq: Three times a day (TID) | ORAL | 0 refills | Status: DC

## 2016-11-20 MED ORDER — PREDNISONE 20 MG PO TABS: 60.0000 mg | ORAL_TABLET | Freq: Every day | ORAL | 0 refills | Status: DC

## 2016-11-20 NOTE — Progress Notes (Signed)
Subjective:    Patient ID: Caleb Gray, male    DOB: 1940-12-28, 76 y.o.   MRN: [id number]  HPI Here with wife due to hearing loss  Woke 6 days ago with pressure in left ear Thought he was coming down with something--but never had nose or throat symptoms No headaches Can't hear out of that ear--- but wasn't aware of that right away  No fever Not sick Hears water in that ear---"like a spring in my ear"  Current Outpatient Prescriptions on File Prior to Visit  Medication Sig Dispense Refill  . albuterol (PROAIR HFA) 108 (90 Base) MCG/ACT inhaler USE 2 INHALATIONS FOUR TIMES A DAY AS NEEDED 54 g 3  . amLODipine (NORVASC) 10 MG tablet Take 1 tablet (10 mg total) by mouth daily. 90 tablet 3  . aspirin 81 MG tablet Take 81 mg by mouth daily.      Marland Kitchen atorvastatin (LIPITOR) 20 MG tablet Take 1 tablet (20 mg total) by mouth at bedtime. 90 tablet 3  . enalapril (VASOTEC) 20 MG tablet Take 1 tablet (20 mg total) by mouth daily. 90 tablet 1  . ibuprofen (ADVIL,MOTRIN) 800 MG tablet TAKE 1 TABLET (800 MG TOTAL) BY MOUTH 2 (TWO) TIMES DAILY. 90 tablet 1  . Multiple Vitamins-Minerals (CENTRUM SILVER) tablet Take 1 tablet by mouth daily.      . Omega-3 Fatty Acids (FISH OIL) 1000 MG CAPS Take by mouth 2 (two) times daily.      No current facility-administered medications on file prior to visit.     Allergies  Allergen Reactions  . Penicillins     Very dry and itchy skin, make throat "feel funny:    Past Medical History:  Diagnosis Date  . ANEMIA-NOS   . ASTHMA   . Chronic diastolic heart failure ([place])   . Colon cancer ([place])    [place] 1997 stage III T3, N1 s/p rectosigmoid resection  . CORONARY ARTERY DISEASE   . HYPERLIPIDEMIA   . HYPERTENSION   . Low back pain 10/2009   s/p laminectomy/decompression L4-5  . Right kidney mass 02/2007   benign (oncocytoma) s/p resection/total nephrectomy  . UNSPECIFIED PERIPHERAL VASCULAR DISEASE     Past Surgical History:  Procedure  Laterality Date  . LUMBAR LAMINECTOMY/DECOMPRESSION MICRODISCECTOMY  10/2009  . NEPHRECTOMY  02/2007   Right  . Rectosigmoidectomy  1997    Family History  Problem Relation Age of Onset  . Colon cancer Mother   . Heart disease Father   . Alcohol abuse Other     Parents  . Esophageal cancer Neg Hx   . Rectal cancer Neg Hx   . Stomach cancer Neg Hx     Social History   Social History  . Marital status: Married    Spouse name: N/A  . Number of children: 2  . Years of education: N/A   Occupational History  . IT--systems      retired   Social History Main Topics  . Smoking status: Former Smoker    Types: Cigarettes    Quit date: 11/11/1968  . Smokeless tobacco: Never Used  . Alcohol use 0.0 oz/week     Comment: occasionally  . Drug use: Unknown  . Sexual activity: Not on file   Other Topics Concern  . Not on file   Social History Narrative   Married, lives with wife, retired in 2007. Moved from [place] to [place] 04/2009      Has living will  Wife is health care POA   Would accept resuscitation   Not sure about tube feeds---might accept   Review of Systems  No balance problems No vertigo     Objective:   Physical Exam  Constitutional: He appears well-developed and well-nourished. No distress.  HENT:  Mouth/Throat: Oropharynx is clear and moist. No oropharyngeal exudate.  Mild nasal congestion No sinus tenderness Right TM/canal normal Left canal clear. ??slight retraction of TM but not inflamed or clear fluid          Assessment & Plan:

## 2016-11-20 NOTE — Assessment & Plan Note (Signed)
Could be Eustachian tube disorder but more likely sudden sensorineural hearing loss Will start high dose prednisone-- 60mg  for 10 days Will give antiviral therapy based on recommendations for this Urgent ENT evaluation

## 2016-11-20 NOTE — Progress Notes (Signed)
Pre visit review using our clinic review tool, if applicable. No additional management support is needed unless otherwise documented below in the visit note. 

## 2016-11-21 ENCOUNTER — Ambulatory Visit (INDEPENDENT_AMBULATORY_CARE_PROVIDER_SITE_OTHER): Payer: Medicare Other | Admitting: Cardiology

## 2016-11-21 ENCOUNTER — Encounter: Payer: Self-pay | Admitting: Cardiology

## 2016-11-21 VITALS — BP 160/86 | HR 93 | Ht 71.0 in | Wt 201.4 lb

## 2016-11-21 DIAGNOSIS — I10 Essential (primary) hypertension: Secondary | ICD-10-CM

## 2016-11-21 DIAGNOSIS — I2510 Atherosclerotic heart disease of native coronary artery without angina pectoris: Secondary | ICD-10-CM | POA: Diagnosis not present

## 2016-11-21 DIAGNOSIS — E7800 Pure hypercholesterolemia, unspecified: Secondary | ICD-10-CM | POA: Diagnosis not present

## 2016-11-21 NOTE — Patient Instructions (Signed)

## 2016-11-21 NOTE — Progress Notes (Signed)
Cardiology Office Note    Date:  11/21/2016   ID:  Caleb Gray, DOB 1941-09-21, MRN [id number]  PCP:  Viviana Simpler, MD  Cardiologist:   Candee Furbish, MD     History of Present Illness:  Caleb Gray is a 76 y.o. male with coronary artery disease, medical management for cardiac catheterization demonstrated 70% mid LAD, 90% distal RCA given lack of symptoms. Low risk Myoview in 2009. Walks golfs. Echocardiogram with normal ejection fraction, normal RV. Complaint of low back pain has been chronic.  He also has a history of colon cancer, hypertension, right nephrectomy in 2008.  Had some left hearing loss, possible neuritis. On prednisone. ENT. Denies any chest pain, syncope, bleeding, orthopnea, shortness of breath.  Past Medical History:  Diagnosis Date  . ANEMIA-NOS   . ASTHMA   . Chronic diastolic heart failure ([place])   . Colon cancer ([place])    [place] 1997 stage III T3, N1 s/p rectosigmoid resection  . CORONARY ARTERY DISEASE   . HYPERLIPIDEMIA   . HYPERTENSION   . Low back pain 10/2009   s/p laminectomy/decompression L4-5  . Right kidney mass 02/2007   benign (oncocytoma) s/p resection/total nephrectomy  . UNSPECIFIED PERIPHERAL VASCULAR DISEASE     Past Surgical History:  Procedure Laterality Date  . LUMBAR LAMINECTOMY/DECOMPRESSION MICRODISCECTOMY  10/2009  . NEPHRECTOMY  02/2007   Right  . Rectosigmoidectomy  1997    Current Medications: Outpatient Medications Prior to Visit  Medication Sig Dispense Refill  . albuterol (PROAIR HFA) 108 (90 Base) MCG/ACT inhaler USE 2 INHALATIONS FOUR TIMES A DAY AS NEEDED 54 g 3  . amLODipine (NORVASC) 10 MG tablet Take 1 tablet (10 mg total) by mouth daily. 90 tablet 3  . aspirin 81 MG tablet Take 81 mg by mouth daily.      Marland Kitchen atorvastatin (LIPITOR) 20 MG tablet Take 1 tablet (20 mg total) by mouth at bedtime. 90 tablet 3  . enalapril (VASOTEC) 20 MG tablet Take 1 tablet (20 mg total) by mouth daily. 90 tablet 1  .  ibuprofen (ADVIL,MOTRIN) 800 MG tablet TAKE 1 TABLET (800 MG TOTAL) BY MOUTH 2 (TWO) TIMES DAILY. 90 tablet 1  . Multiple Vitamins-Minerals (CENTRUM SILVER) tablet Take 1 tablet by mouth daily.      . Omega-3 Fatty Acids (FISH OIL) 1000 MG CAPS Take by mouth 2 (two) times daily.     . predniSONE (DELTASONE) 20 MG tablet Take 3 tablets (60 mg total) by mouth daily. 30 tablet 0  . valACYclovir (VALTREX) 1000 MG tablet Take 1 tablet (1,000 mg total) by mouth 3 (three) times daily. 21 tablet 0   No facility-administered medications prior to visit.      Allergies:   Penicillins   Social History   Social History  . Marital status: Married    Spouse name: N/A  . Number of children: 2  . Years of education: N/A   Occupational History  . IT--systems      retired   Social History Main Topics  . Smoking status: Former Smoker    Types: Cigarettes    Quit date: 11/11/1968  . Smokeless tobacco: Never Used  . Alcohol use 0.0 oz/week     Comment: occasionally  . Drug use: Unknown  . Sexual activity: Not Asked   Other Topics Concern  . None   Social History Narrative   Married, lives with wife, retired in 2007. Moved from [place] to [place] 04/2009  Has living will    Wife is health care POA   Would accept resuscitation   Not sure about tube feeds---might accept     Family History:  The patient's family history includes Alcohol abuse in his other; Colon cancer in his mother; Heart disease in his father.   ROS:   Please see the history of present illness.    ROS All other systems reviewed and are negative.   PHYSICAL EXAM:   VS:  BP (!) 160/86   Pulse 93   Ht 5\' 11"  (1.803 m)   Wt 201 lb 6.4 oz (91.4 kg)   BMI 28.09 kg/m    GEN: Well nourished, well developed, in no acute distress  HEENT: normal LEFT hearing loss Neck: no JVD, carotid bruits, or masses Cardiac: RRR; no murmurs, rubs, or gallops,no edema  Respiratory:  clear to auscultation bilaterally, normal  work of breathing GI: soft, nontender, nondistended, + BS MS: no deformity or atrophy  Skin: warm and dry, no rash Neuro:  Alert and Oriented x 3, Strength and sensation are intact Psych: euthymic mood, full affect  Wt Readings from Last 3 Encounters:  11/21/16 201 lb 6.4 oz (91.4 kg)  11/20/16 200 lb (90.7 kg)  02/07/16 202 lb (91.6 kg)      Studies/Labs Reviewed:   EKG:  EKG is ordered today.  The ekg ordered today demonstrates sinus rhythm, PVC, Q waves in 2, 3, old inferior infarct. No significant change from prior. QT appears normal.  Recent Labs: 02/07/2016: ALT 37; BUN 21; Creatinine, Ser 1.40; Hemoglobin 14.4; Platelets 229.0; Potassium 4.1; Sodium 140   Lipid Panel    Component Value Date/Time   CHOL 127 02/07/2016 1154   TRIG 105.0 02/07/2016 1154   TRIG 79 03/08/2009   HDL 42.40 02/07/2016 1154   CHOLHDL 3 02/07/2016 1154   VLDL 21.0 02/07/2016 1154   LDLCALC 63 02/07/2016 1154   LDLCALC 62 03/08/2009   LDLDIRECT 159.7 11/28/2009 0918    Additional studies/ records that were reviewed today include:  ECHO 06/2014  - EF 60-65%, mild LVH, normal RV size and systolic function.    ETT-myoview 8/09  - fixed deficit at basal inferior segment (small), low risk.   Cath 02/2007 in [place]:  - 70% mid LAD, 90% dist RCA managed medically  ASSESSMENT:    1. Atherosclerosis of native coronary artery of native heart without angina pectoris   2. Essential hypertension   3. Pure hypercholesterolemia      PLAN:  In order of problems listed above:  HYPERLIPIDEMIA LDL was at goal (< 70) when checked in 02/07/16.   CORONARY ARTERY DISEASE  Stable with no ischemic symptoms. Preserved EF on 8/15 echo. Continue ASA, statin, enalapril.   HYPERTENSION  BP is controlledFor the most part but since October 2017 he has noticed an uptick at home. He reported 138/79 at home. He has gained approximately 5-10 pounds. I suggested lifestyle modification, weight loss before  prescribing new medication.   Left hearing loss.   Medication Adjustments/Labs and Tests Ordered: Current medicines are reviewed at length with the patient today.  Concerns regarding medicines are outlined above.  Medication changes, Labs and Tests ordered today are listed in the Patient Instructions below. Patient Instructions  Medication Instructions:  The current medical regimen is effective;  continue present plan and medications.  Follow-Up: Follow up in 1 year with Dr. Marlou Porch.  You will receive a letter in the mail 2 months before you are due.  Please call us when you receive this letter to schedule your follow up appointment.  If you need a refill on your cardiac medications before your next appointment, please call your pharmacy.  Thank you for choosing [facility]!!        Signed, Candee Furbish, MD  11/21/2016 12:02 PM    [address], [place], [place]  [address] Phone: [phone]; Fax: [phone]

## 2016-12-01 ENCOUNTER — Encounter: Payer: Self-pay | Admitting: Student

## 2016-12-04 DIAGNOSIS — H903 Sensorineural hearing loss, bilateral: Secondary | ICD-10-CM | POA: Diagnosis not present

## 2016-12-04 DIAGNOSIS — H9122 Sudden idiopathic hearing loss, left ear: Secondary | ICD-10-CM | POA: Diagnosis not present

## 2016-12-04 DIAGNOSIS — H6980 Other specified disorders of Eustachian tube, unspecified ear: Secondary | ICD-10-CM | POA: Diagnosis not present

## 2017-01-06 DIAGNOSIS — H6980 Other specified disorders of Eustachian tube, unspecified ear: Secondary | ICD-10-CM | POA: Diagnosis not present

## 2017-01-06 DIAGNOSIS — H903 Sensorineural hearing loss, bilateral: Secondary | ICD-10-CM | POA: Diagnosis not present

## 2017-01-06 DIAGNOSIS — H90A22 Sensorineural hearing loss, unilateral, left ear, with restricted hearing on the contralateral side: Secondary | ICD-10-CM | POA: Diagnosis not present

## 2017-01-06 DIAGNOSIS — J301 Allergic rhinitis due to pollen: Secondary | ICD-10-CM | POA: Diagnosis not present

## 2017-01-17 ENCOUNTER — Encounter: Payer: Self-pay | Admitting: Internal Medicine

## 2017-01-17 MED ORDER — ENALAPRIL MALEATE 20 MG PO TABS: 20.0000 mg | ORAL_TABLET | Freq: Every day | ORAL | 1 refills | Status: DC

## 2017-01-22 ENCOUNTER — Encounter: Payer: Self-pay | Admitting: Internal Medicine

## 2017-02-10 ENCOUNTER — Encounter: Payer: Self-pay | Admitting: Internal Medicine

## 2017-02-10 ENCOUNTER — Ambulatory Visit (INDEPENDENT_AMBULATORY_CARE_PROVIDER_SITE_OTHER): Payer: Medicare Other | Admitting: Internal Medicine

## 2017-02-10 VITALS — BP 138/76 | HR 79 | Temp 98.3°F | Ht 69.0 in | Wt 195.0 lb

## 2017-02-10 DIAGNOSIS — Z7189 Other specified counseling: Secondary | ICD-10-CM | POA: Diagnosis not present

## 2017-02-10 DIAGNOSIS — Z0000 Encounter for general adult medical examination without abnormal findings: Secondary | ICD-10-CM | POA: Diagnosis not present

## 2017-02-10 DIAGNOSIS — E785 Hyperlipidemia, unspecified: Secondary | ICD-10-CM

## 2017-02-10 DIAGNOSIS — J4520 Mild intermittent asthma, uncomplicated: Secondary | ICD-10-CM

## 2017-02-10 DIAGNOSIS — I10 Essential (primary) hypertension: Secondary | ICD-10-CM

## 2017-02-10 DIAGNOSIS — I2510 Atherosclerotic heart disease of native coronary artery without angina pectoris: Secondary | ICD-10-CM

## 2017-02-10 LAB — CBC WITH DIFFERENTIAL/PLATELET
Basophils Absolute: 0.1 10*3/uL (ref 0.0–0.1)
Basophils Relative: 1 % (ref 0.0–3.0)
EOS ABS: 0.4 10*3/uL (ref 0.0–0.7)
Eosinophils Relative: 5.4 % — ABNORMAL HIGH (ref 0.0–5.0)
HEMATOCRIT: 40.6 % (ref 39.0–52.0)
HEMOGLOBIN: 14.1 g/dL (ref 13.0–17.0)
LYMPHS PCT: 24.3 % (ref 12.0–46.0)
Lymphs Abs: 1.6 10*3/uL (ref 0.7–4.0)
MCHC: 34.8 g/dL (ref 30.0–36.0)
MCV: 93.9 fl (ref 78.0–100.0)
MONO ABS: 0.6 10*3/uL (ref 0.1–1.0)
Monocytes Relative: 9.1 % (ref 3.0–12.0)
NEUTROS PCT: 60.2 % (ref 43.0–77.0)
Neutro Abs: 4 10*3/uL (ref 1.4–7.7)
Platelets: 237 10*3/uL (ref 150.0–400.0)
RBC: 4.33 Mil/uL (ref 4.22–5.81)
RDW: 13.5 % (ref 11.5–15.5)
WBC: 6.6 10*3/uL (ref 4.0–10.5)

## 2017-02-10 LAB — COMPREHENSIVE METABOLIC PANEL
ALBUMIN: 4.1 g/dL (ref 3.5–5.2)
ALT: 31 U/L (ref 0–53)
AST: 29 U/L (ref 0–37)
Alkaline Phosphatase: 77 U/L (ref 39–117)
BILIRUBIN TOTAL: 0.8 mg/dL (ref 0.2–1.2)
BUN: 17 mg/dL (ref 6–23)
CALCIUM: 9.3 mg/dL (ref 8.4–10.5)
CO2: 29 mEq/L (ref 19–32)
CREATININE: 1.2 mg/dL (ref 0.40–1.50)
Chloride: 105 mEq/L (ref 96–112)
GFR: 62.63 mL/min (ref >60.00)
Glucose, Bld: 125 mg/dL — ABNORMAL HIGH (ref 70–99)
Potassium: 3.6 mEq/L (ref 3.5–5.1)
Sodium: 141 mEq/L (ref 135–145)
Total Protein: 6.4 g/dL (ref 6.0–8.3)

## 2017-02-10 LAB — LIPID PANEL
CHOL/HDL RATIO: 3
Cholesterol: 112 mg/dL (ref 0–200)
HDL: 40.5 mg/dL (ref >39.00)
LDL CALC: 56 mg/dL (ref 0–99)
NONHDL: 71.84
Triglycerides: 78 mg/dL (ref 0.0–149.0)
VLDL: 15.6 mg/dL (ref 0.0–40.0)

## 2017-02-10 NOTE — Assessment & Plan Note (Signed)
I have personally reviewed the Medicare Annual Wellness questionnaire and have noted 1. The patient's medical and social history 2. Their use of alcohol, tobacco or illicit drugs 3. Their current medications and supplements 4. The patient's functional ability including ADL's, fall risks, home safety risks and hearing or visual             impairment. 5. Diet and physical activities 6. Evidence for depression or mood disorders  The patients weight, height, BMI and visual acuity have been recorded in the chart I have made referrals, counseling and provided education to the patient based review of the above and I have provided the pt with a written personalized care plan for preventive services.  I have provided you with a copy of your personalized plan for preventive services. Please take the time to review along with your updated medication list.  Yearly flu vaccine Colon due 2020 No PSA due to age Discussed exercise

## 2017-02-10 NOTE — Progress Notes (Signed)
Pre visit review using our clinic review tool, if applicable. No additional management support is needed unless otherwise documented below in the visit note. 

## 2017-02-10 NOTE — Progress Notes (Signed)
Subjective:    Patient ID: Caleb Gray, male    DOB: 1941-03-19, 76 y.o.   MRN: [id number]  HPI Here for Medicare wellness and follow up of chronic health conditions--wife with him Reviewed form and advanced directives Reviewed other doctors Occasional drink of alcohol No tobacco now No falls Has had some improvement in hearing in left ear---going back to consider hearing aide Vision is fine No depression or anhedonia Independent with instrumental ADLs Only minor memory problems  No heart problems Walks regularly in past--hopes to get back out on golf course (rides cart) No chest pain No SOB No palpitations No edema--will occasionally have slight swelling due to amlodipine  No recent problems with legs No claudication of late PVD found about 10 years ago--but no current problems  Asthma is quiet No regular cough or wheezing Has cut back albuterol to once in AM  Current Outpatient Prescriptions on File Prior to Visit  Medication Sig Dispense Refill  . albuterol (PROAIR HFA) 108 (90 Base) MCG/ACT inhaler USE 2 INHALATIONS FOUR TIMES A DAY AS NEEDED 54 g 3  . amLODipine (NORVASC) 10 MG tablet Take 1 tablet (10 mg total) by mouth daily. 90 tablet 3  . aspirin 81 MG tablet Take 81 mg by mouth daily.      Marland Kitchen atorvastatin (LIPITOR) 20 MG tablet Take 1 tablet (20 mg total) by mouth at bedtime. 90 tablet 3  . enalapril (VASOTEC) 20 MG tablet Take 1 tablet (20 mg total) by mouth daily. 90 tablet 1  . ibuprofen (ADVIL,MOTRIN) 800 MG tablet TAKE 1 TABLET (800 MG TOTAL) BY MOUTH 2 (TWO) TIMES DAILY. 90 tablet 1  . Multiple Vitamins-Minerals (CENTRUM SILVER) tablet Take 1 tablet by mouth daily.      . Omega-3 Fatty Acids (FISH OIL) 1000 MG CAPS Take by mouth 2 (two) times daily.      No current facility-administered medications on file prior to visit.     Allergies  Allergen Reactions  . Penicillins     Very dry and itchy skin, make throat "feel funny:    Past Medical  History:  Diagnosis Date  . ANEMIA-NOS   . ASTHMA   . Chronic diastolic heart failure ([place])   . Colon cancer ([place])    [place] 1997 stage III T3, N1 s/p rectosigmoid resection  . CORONARY ARTERY DISEASE   . HYPERLIPIDEMIA   . HYPERTENSION   . Low back pain 10/2009   s/p laminectomy/decompression L4-5  . Right kidney mass 02/2007   benign (oncocytoma) s/p resection/total nephrectomy  . UNSPECIFIED PERIPHERAL VASCULAR DISEASE     Past Surgical History:  Procedure Laterality Date  . LUMBAR LAMINECTOMY/DECOMPRESSION MICRODISCECTOMY  10/2009  . NEPHRECTOMY  02/2007   Right  . Rectosigmoidectomy  1997    Family History  Problem Relation Age of Onset  . Colon cancer Mother   . Heart disease Father   . Alcohol abuse Other     Parents  . Esophageal cancer Neg Hx   . Rectal cancer Neg Hx   . Stomach cancer Neg Hx     Social History   Social History  . Marital status: Married    Spouse name: N/A  . Number of children: 2  . Years of education: N/A   Occupational History  . IT--systems      retired   Social History Main Topics  . Smoking status: Former Smoker    Types: Cigarettes    Quit date: 11/11/1968  . Smokeless tobacco:  Never Used  . Alcohol use 0.0 oz/week     Comment: occasionally  . Drug use: Unknown  . Sexual activity: Not on file   Other Topics Concern  . Not on file   Social History Narrative   Married, lives with wife, retired in 2007. Moved from [place] to [place] 04/2009      Has living will    Wife is health care POA   Would accept resuscitation   Not sure about tube feeds---might accept   Review of Systems Appetite is good Weight is  down about 5# Sleeps okay Teeth okay--keeps up with dentist Wears seat belt Some chronic back pain-- uses ibuprofen 200 at bedtime for this No rash or suspicious lesions. Has yearly derm visit ([facility] ~2 years ago) Bowels are fine--no blood Slow urinary stream--especially after long car ride. Not a big  deal. Nocturia stable at 2-3 per night No heartburn or dysphagia    Objective:   Physical Exam  Constitutional: He is oriented to person, place, and time. He appears well-developed and well-nourished. No distress.  HENT:  Mouth/Throat: Oropharynx is clear and moist. No oropharyngeal exudate.  Full upper denture  Neck: Normal range of motion. Neck supple. No thyromegaly present.  Cardiovascular: Normal rate, regular rhythm, normal heart sounds and intact distal pulses.  Exam reveals no gallop.   No murmur heard. Pulmonary/Chest: Effort normal and breath sounds normal. No respiratory distress. He has no wheezes. He has no rales.  Abdominal: Soft. There is no tenderness.  Musculoskeletal: He exhibits no edema or tenderness.  Lymphadenopathy:    He has no cervical adenopathy.  Neurological: He is alert and oriented to person, place, and time.  President--- "Dwaine Deter, Bush" [phone] [id number] Recall 3/3  Skin: No rash noted. No erythema.  Psychiatric: He has a normal mood and affect. His behavior is normal.          Assessment & Plan:

## 2017-02-10 NOTE — Assessment & Plan Note (Signed)
No problems with the statin

## 2017-02-10 NOTE — Assessment & Plan Note (Signed)
Quiet Keeps up with cardiologist

## 2017-02-10 NOTE — Assessment & Plan Note (Signed)
Does okay with albuterol

## 2017-02-10 NOTE — Assessment & Plan Note (Signed)
BP Readings from Last 3 Encounters:  02/10/17 138/76  11/21/16 (!) 160/86  11/20/16 (!) 148/74   Good control

## 2017-02-10 NOTE — Assessment & Plan Note (Signed)
See social history 

## 2017-03-28 ENCOUNTER — Encounter: Payer: Self-pay | Admitting: Internal Medicine

## 2017-03-28 MED ORDER — ATORVASTATIN CALCIUM 20 MG PO TABS: 20.0000 mg | ORAL_TABLET | Freq: Every day | ORAL | 3 refills | Status: DC

## 2017-04-08 ENCOUNTER — Other Ambulatory Visit: Payer: Self-pay | Admitting: Internal Medicine

## 2017-06-27 ENCOUNTER — Other Ambulatory Visit: Payer: Self-pay | Admitting: Internal Medicine

## 2017-07-21 DIAGNOSIS — D1801 Hemangioma of skin and subcutaneous tissue: Secondary | ICD-10-CM | POA: Diagnosis not present

## 2017-07-21 DIAGNOSIS — L820 Inflamed seborrheic keratosis: Secondary | ICD-10-CM | POA: Diagnosis not present

## 2017-07-21 DIAGNOSIS — D2270 Melanocytic nevi of unspecified lower limb, including hip: Secondary | ICD-10-CM | POA: Diagnosis not present

## 2017-07-21 DIAGNOSIS — L814 Other melanin hyperpigmentation: Secondary | ICD-10-CM | POA: Diagnosis not present

## 2017-07-21 DIAGNOSIS — D225 Melanocytic nevi of trunk: Secondary | ICD-10-CM | POA: Diagnosis not present

## 2017-07-21 DIAGNOSIS — L821 Other seborrheic keratosis: Secondary | ICD-10-CM | POA: Diagnosis not present

## 2017-07-21 DIAGNOSIS — D2260 Melanocytic nevi of unspecified upper limb, including shoulder: Secondary | ICD-10-CM | POA: Diagnosis not present

## 2017-07-21 DIAGNOSIS — D239 Other benign neoplasm of skin, unspecified: Secondary | ICD-10-CM | POA: Diagnosis not present

## 2017-07-21 DIAGNOSIS — D3610 Benign neoplasm of peripheral nerves and autonomic nervous system, unspecified: Secondary | ICD-10-CM | POA: Diagnosis not present

## 2017-07-21 DIAGNOSIS — Z85828 Personal history of other malignant neoplasm of skin: Secondary | ICD-10-CM | POA: Diagnosis not present

## 2017-07-21 DIAGNOSIS — L578 Other skin changes due to chronic exposure to nonionizing radiation: Secondary | ICD-10-CM | POA: Diagnosis not present

## 2017-07-21 DIAGNOSIS — Z1283 Encounter for screening for malignant neoplasm of skin: Secondary | ICD-10-CM | POA: Diagnosis not present

## 2017-07-24 DIAGNOSIS — H903 Sensorineural hearing loss, bilateral: Secondary | ICD-10-CM | POA: Diagnosis not present

## 2017-08-08 ENCOUNTER — Other Ambulatory Visit: Payer: Self-pay | Admitting: Internal Medicine

## 2017-08-20 DIAGNOSIS — Z23 Encounter for immunization: Secondary | ICD-10-CM | POA: Diagnosis not present

## 2017-09-02 ENCOUNTER — Other Ambulatory Visit: Payer: Self-pay | Admitting: Internal Medicine

## 2017-11-05 DIAGNOSIS — D649 Anemia, unspecified: Secondary | ICD-10-CM | POA: Insufficient documentation

## 2017-11-05 DIAGNOSIS — I739 Peripheral vascular disease, unspecified: Secondary | ICD-10-CM | POA: Insufficient documentation

## 2017-11-05 DIAGNOSIS — Z85038 Personal history of other malignant neoplasm of large intestine: Secondary | ICD-10-CM | POA: Insufficient documentation

## 2017-11-17 DIAGNOSIS — H26493 Other secondary cataract, bilateral: Secondary | ICD-10-CM | POA: Diagnosis not present

## 2017-11-17 DIAGNOSIS — H524 Presbyopia: Secondary | ICD-10-CM | POA: Diagnosis not present

## 2017-11-17 DIAGNOSIS — H43813 Vitreous degeneration, bilateral: Secondary | ICD-10-CM | POA: Diagnosis not present

## 2017-11-20 ENCOUNTER — Ambulatory Visit: Payer: Self-pay

## 2017-11-20 NOTE — Telephone Encounter (Signed)
   Reason for Disposition . [1] MODERATE back pain (e.g., interferes with normal activities) AND [2] present > 3 days  Answer Assessment - Initial Assessment Questions 1. ONSET: "When did the pain begin?"      Started Monday after taking down Christmas decorations. 2. LOCATION: "Where does it hurt?" (upper, mid or lower back)     Lower back 3. SEVERITY: "How bad is the pain?"  (e.g., Scale 1-10; mild, moderate, or severe)   - MILD (1-3): doesn't interfere with normal activities    - MODERATE (4-7): interferes with normal activities or awakens from sleep    - SEVERE (8-10): excruciating pain, unable to do any normal activities      7 4. PATTERN: "Is the pain constant?" (e.g., yes, no; constant, intermittent)      Comes and goes. Worse at night. Can not sleep 5. RADIATION: "Does the pain shoot into your legs or elsewhere?"     Legs hurt 6. CAUSE:  "What do you think is causing the back pain?"      Worked outside [employer] [place] 7. BACK OVERUSE:  "Any recent lifting of heavy objects, strenuous work or exercise?"     Yes 8. MEDICATIONS: "What have you taken so far for the pain?" (e.g., nothing, acetaminophen, NSAIDS)    Ibuprofen is not helping 9. NEUROLOGIC SYMPTOMS: "Do you have any weakness, numbness, or problems with bowel/bladder control?"     Some numbness 10. OTHER SYMPTOMS: "Do you have any other symptoms?" (e.g., fever, abdominal pain, burning with urination, blood in urine)       No 11. PREGNANCY: "Is there any chance you are pregnant?" (e.g., yes, no; LMP)       No  Protocols used: BACK PAIN-A-AH  Disc surgery 8 years ago. Does not want to go to another office at this time. Will continue with ibuprofen and try heat.

## 2017-11-21 ENCOUNTER — Ambulatory Visit (INDEPENDENT_AMBULATORY_CARE_PROVIDER_SITE_OTHER): Payer: Medicare Other | Admitting: Cardiology

## 2017-11-21 ENCOUNTER — Encounter: Payer: Self-pay | Admitting: Cardiology

## 2017-11-21 VITALS — BP 150/90 | HR 81 | Ht 69.0 in | Wt 197.8 lb

## 2017-11-21 DIAGNOSIS — I10 Essential (primary) hypertension: Secondary | ICD-10-CM

## 2017-11-21 DIAGNOSIS — I2510 Atherosclerotic heart disease of native coronary artery without angina pectoris: Secondary | ICD-10-CM

## 2017-11-21 NOTE — Progress Notes (Signed)
Cardiology Office Note    Date:  11/21/2017   ID:  Caleb Gray, DOB 1941-07-05, MRN [id number]  PCP:  [name], MD  Cardiologist:   Candee Furbish, MD     History of Present Illness:  Caleb Gray is a 77 y.o. male with coronary artery disease, medical management for cardiac catheterization demonstrated 70% mid LAD, 90% distal RCA given lack of symptoms. Low risk Myoview in 2009. Walks golfs. Echocardiogram with normal ejection fraction, normal RV. Complaint of low back pain has been chronic.  He also has a history of colon cancer, hypertension, right nephrectomy in 2008.  Had some left hearing loss, possible neuritis. On prednisone. ENT.   Overall been doing quite well.  No chest pain fevers chills nausea vomiting syncope bleeding.  Past Medical History:  Diagnosis Date  . ANEMIA-NOS   . ASTHMA   . Chronic diastolic heart failure ([place])   . Colon cancer ([place])    [place] 1997 stage III T3, N1 s/p rectosigmoid resection  . CORONARY ARTERY DISEASE   . HYPERLIPIDEMIA   . HYPERTENSION   . Low back pain 10/2009   s/p laminectomy/decompression L4-5  . Right kidney mass 02/2007   benign (oncocytoma) s/p resection/total nephrectomy  . UNSPECIFIED PERIPHERAL VASCULAR DISEASE     Past Surgical History:  Procedure Laterality Date  . LUMBAR LAMINECTOMY/DECOMPRESSION MICRODISCECTOMY  10/2009  . NEPHRECTOMY  02/2007   Right  . Rectosigmoidectomy  1997    Current Medications: Outpatient Medications Prior to Visit  Medication Sig Dispense Refill  . albuterol (PROAIR HFA) 108 (90 Base) MCG/ACT inhaler USE 2 INHALATIONS FOUR TIMES A DAY AS NEEDED 54 g 3  . amLODipine (NORVASC) 10 MG tablet TAKE 1 TABLET DAILY 90 tablet 3  . aspirin 81 MG tablet Take 81 mg by mouth daily.      Marland Kitchen atorvastatin (LIPITOR) 20 MG tablet Take 1 tablet (20 mg total) by mouth at bedtime. 90 tablet 3  . enalapril (VASOTEC) 20 MG tablet TAKE 1 TABLET DAILY 90 tablet 2  . fluticasone (FLONASE)  50 MCG/ACT nasal spray Place 2 sprays into both nostrils daily.    Marland Kitchen FLUZONE HIGH-DOSE 0.5 ML injection Inject as directed once.  0  . ibuprofen (ADVIL,MOTRIN) 800 MG tablet TAKE 1 TABLET (800 MG TOTAL) BY MOUTH 2 (TWO) TIMES DAILY. 90 tablet 1  . Multiple Vitamins-Minerals (CENTRUM SILVER) tablet Take 1 tablet by mouth daily.      . Omega-3 Fatty Acids (FISH OIL) 1000 MG CAPS Take by mouth 2 (two) times daily.      No facility-administered medications prior to visit.      Allergies:   Penicillins   Social History   Socioeconomic History  . Marital status: Married    Spouse name: None  . Number of children: 2  . Years of education: None  . Highest education level: None  Social Needs  . Financial resource strain: None  . Food insecurity - worry: None  . Food insecurity - inability: None  . Transportation needs - medical: None  . Transportation needs - non-medical: None  Occupational History  . Occupation: IT--systems     Comment: retired  Tobacco Use  . Smoking status: Former Smoker    Types: Cigarettes    Last attempt to quit: 11/11/1968    Years since quitting: 49.0  . Smokeless tobacco: Never Used  Substance and Sexual Activity  . Alcohol use: Yes    Alcohol/week: 0.0 oz  Comment: occasionally  . Drug use: None  . Sexual activity: None  Other Topics Concern  . None  Social History Narrative   Married, lives with wife, retired in 2007. Moved from [place] to [place] 04/2009      Has living will    Wife is health care POA   Would accept resuscitation   Not sure about tube feeds---might accept     Family History:  The patient's family history includes Alcohol abuse in his other; Colon cancer in his mother; Heart disease in his father.   ROS:   Please see the history of present illness.    Review of Systems  All other systems reviewed and are negative.    PHYSICAL EXAM:   VS:  BP (!) 150/90   Pulse 81   Ht 5\' 9"  (1.753 m)   Wt 197 lb 12.8 oz (89.7  kg)   SpO2 97%   BMI 29.21 kg/m    GEN: Well nourished, well developed, in no acute distress  HEENT: normal LEFT hearing loss Neck: no JVD, carotid bruits, or masses Cardiac: RRR; no murmurs, rubs, or gallops,no edema  Respiratory:  clear to auscultation bilaterally, normal work of breathing GI: soft, nontender, nondistended, + BS MS: no deformity or atrophy  Skin: warm and dry, no rash Neuro:  Alert and Oriented x 3, Strength and sensation are intact Psych: euthymic mood, full affect  Wt Readings from Last 3 Encounters:  11/21/17 197 lb 12.8 oz (89.7 kg)  02/10/17 195 lb (88.5 kg)  11/21/16 201 lb 6.4 oz (91.4 kg)      Studies/Labs Reviewed:   EKG:  EKG is ordered today.  11/21/17 - NSR, small inferior Q waves noted, nonspecific ST-T wave changes personally viewed-prior demonstrates sinus rhythm, PVC, Q waves in 2, 3, old inferior infarct. No significant change from prior. QT appears normal.  Recent Labs: 02/10/2017: ALT 31; BUN 17; Creatinine, Ser 1.20; Hemoglobin 14.1; Platelets 237.0; Potassium 3.6; Sodium 141   Lipid Panel    Component Value Date/Time   CHOL 112 02/10/2017 1125   TRIG 78.0 02/10/2017 1125   TRIG 79 03/08/2009   HDL 40.50 02/10/2017 1125   CHOLHDL 3 02/10/2017 1125   VLDL 15.6 02/10/2017 1125   LDLCALC 56 02/10/2017 1125   LDLCALC 62 03/08/2009   LDLDIRECT 159.7 11/28/2009 0918    Additional studies/ records that were reviewed today include:  ECHO 06/2014  - EF 60-65%, mild LVH, normal RV size and systolic function.    ETT-myoview 8/09  - fixed deficit at basal inferior segment (small), low risk.   Cath 02/2007 in [place]:  - 70% mid LAD, 90% dist RCA managed medically  ASSESSMENT:    1. Essential (primary) hypertension   2. Atherosclerosis of native coronary artery of native heart without angina pectoris   3. Essential hypertension      PLAN:  In order of problems listed above:  HYPERLIPIDEMIA LDL was at goal (< 70), LDL 56 02/10/17.   Excellent.  His wife had trouble with statin intolerance in the past.  He has been taking his atorvastatin for 15 years.  Doing quite well with it.  I think his acute back pain episode is as he says from taking down Christmas lights.  He is going to have this looked at.  CORONARY ARTERY DISEASE  Stable with no ischemic symptoms. Preserved EF on 8/15 echo. Continue ASA, statin, enalapril.  See catheterization above.  No angina.  Doing very well.  Described his coronary plaques.  HYPERTENSION  BP is controlledFor the most part but since October 2017 he has noticed an uptick at home. He reported 138/79 at home. He has gained approximately 5-10 pounds. I suggested lifestyle modification, weight loss before prescribing new medication.  He is watching his salt intake.  His blood pressure cuff at home usually reads between 125 and 140.  Sometimes it can very rarely go to 145.  Acute back pain -He is going to see Dr. Silvio Pate.  This occurred after taking down Christmas lights.  Low back stiffness.  He is having some right leg sciatica.  Left hearing loss.   Medication Adjustments/Labs and Tests Ordered: Current medicines are reviewed at length with the patient today.  Concerns regarding medicines are outlined above.  Medication changes, Labs and Tests ordered today are listed in the Patient Instructions below. Patient Instructions  Medication Instructions:  The current medical regimen is effective;  continue present plan and medications.  Follow-Up: Follow up in 1 year with Dr. Marlou Porch.  You will receive a letter in the mail 2 months before you are due.  Please call us when you receive this letter to schedule your follow up appointment.  If you need a refill on your cardiac medications before your next appointment, please call your pharmacy.  Thank you for choosing [facility]!!        Signed, Candee Furbish, MD  11/21/2017 10:05 AM    [address], [place], [place]  [address] Phone: [phone]; Fax: [phone]

## 2017-11-21 NOTE — Patient Instructions (Signed)

## 2017-11-24 ENCOUNTER — Ambulatory Visit (INDEPENDENT_AMBULATORY_CARE_PROVIDER_SITE_OTHER): Payer: Medicare Other | Admitting: Internal Medicine

## 2017-11-24 ENCOUNTER — Encounter: Payer: Self-pay | Admitting: Internal Medicine

## 2017-11-24 VITALS — BP 130/70 | HR 91 | Temp 98.1°F | Wt 201.0 lb

## 2017-11-24 DIAGNOSIS — I2510 Atherosclerotic heart disease of native coronary artery without angina pectoris: Secondary | ICD-10-CM

## 2017-11-24 DIAGNOSIS — M5441 Lumbago with sciatica, right side: Secondary | ICD-10-CM | POA: Diagnosis not present

## 2017-11-24 MED ORDER — PREDNISONE 20 MG PO TABS: 40.0000 mg | ORAL_TABLET | Freq: Every day | ORAL | 0 refills | Status: DC

## 2017-11-24 NOTE — Assessment & Plan Note (Signed)
No evidence of disc problems  Discussed that it should be self limited Okay ibuprofen briefly Will try 3 days of prednisone

## 2017-11-24 NOTE — Progress Notes (Signed)
Subjective:    Patient ID: Caleb Gray, male    DOB: 09/17/1941, 77 y.o.   MRN: [id number]  HPI Here with wife due to back pain  Has had some back pain--mostly in bed and if raising right leg Has numbness and uncomfortable in right leg--but also low back Somewhat better since last week Has tried tylenol and ibuprofen (up to 800 bid)  Was having to get out of bed to walk and sit in chair  Notes some occasional low back pain intermittently since disc surgery some years ago  This may have started about a week ago after taking down some Christmas decorations 2 days later --the symptoms started Fine sitting and walking No leg weakness  Current Outpatient Medications on File Prior to Visit  Medication Sig Dispense Refill  . albuterol (PROAIR HFA) 108 (90 Base) MCG/ACT inhaler USE 2 INHALATIONS FOUR TIMES A DAY AS NEEDED 54 g 3  . amLODipine (NORVASC) 10 MG tablet TAKE 1 TABLET DAILY 90 tablet 3  . aspirin 81 MG tablet Take 81 mg by mouth daily.      Marland Kitchen atorvastatin (LIPITOR) 20 MG tablet Take 1 tablet (20 mg total) by mouth at bedtime. 90 tablet 3  . enalapril (VASOTEC) 20 MG tablet TAKE 1 TABLET DAILY 90 tablet 2  . fluticasone (FLONASE) 50 MCG/ACT nasal spray Place 2 sprays into both nostrils daily.    Marland Kitchen ibuprofen (ADVIL,MOTRIN) 800 MG tablet TAKE 1 TABLET (800 MG TOTAL) BY MOUTH 2 (TWO) TIMES DAILY. 90 tablet 1  . Multiple Vitamins-Minerals (CENTRUM SILVER) tablet Take 1 tablet by mouth daily.      . Omega-3 Fatty Acids (FISH OIL) 1000 MG CAPS Take by mouth 2 (two) times daily.      No current facility-administered medications on file prior to visit.     Allergies  Allergen Reactions  . Penicillins     Very dry and itchy skin, make throat "feel funny:    Past Medical History:  Diagnosis Date  . ANEMIA-NOS   . ASTHMA   . Chronic diastolic heart failure ([place])   . Colon cancer ([place])    [place] 1997 stage III T3, N1 s/p rectosigmoid resection  . CORONARY ARTERY  DISEASE   . HYPERLIPIDEMIA   . HYPERTENSION   . Low back pain 10/2009   s/p laminectomy/decompression L4-5  . Right kidney mass 02/2007   benign (oncocytoma) s/p resection/total nephrectomy  . UNSPECIFIED PERIPHERAL VASCULAR DISEASE     Past Surgical History:  Procedure Laterality Date  . LUMBAR LAMINECTOMY/DECOMPRESSION MICRODISCECTOMY  10/2009  . NEPHRECTOMY  02/2007   Right  . Rectosigmoidectomy  1997    Family History  Problem Relation Age of Onset  . Colon cancer Mother   . Heart disease Father   . Alcohol abuse Other        Parents  . Esophageal cancer Neg Hx   . Rectal cancer Neg Hx   . Stomach cancer Neg Hx     Social History   Socioeconomic History  . Marital status: Married    Spouse name: Not on file  . Number of children: 2  . Years of education: Not on file  . Highest education level: Not on file  Social Needs  . Financial resource strain: Not on file  . Food insecurity - worry: Not on file  . Food insecurity - inability: Not on file  . Transportation needs - medical: Not on file  . Transportation needs - non-medical: Not on  file  Occupational History  . Occupation: IT--systems     Comment: retired  Tobacco Use  . Smoking status: Former Smoker    Types: Cigarettes    Last attempt to quit: 11/11/1968    Years since quitting: 49.0  . Smokeless tobacco: Never Used  Substance and Sexual Activity  . Alcohol use: Yes    Alcohol/week: 0.0 oz    Comment: occasionally  . Drug use: Not on file  . Sexual activity: Not on file  Other Topics Concern  . Not on file  Social History Narrative   Married, lives with wife, retired in 2007. Moved from [place] to [place] 04/2009      Has living will    Wife is health care POA   Would accept resuscitation   Not sure about tube feeds---might accept   Review of Systems  No fever Not sick No loss of bowel or bladder control     Objective:   Physical Exam  Constitutional: No distress.    Musculoskeletal:  No spine or back tenderness Some left back discomfort when supine SLR negative Normal ROM in right hip Flexion to about 75 degrees  Neurological:  Normal gait and leg strength          Assessment & Plan:

## 2018-02-11 ENCOUNTER — Ambulatory Visit (INDEPENDENT_AMBULATORY_CARE_PROVIDER_SITE_OTHER): Payer: Medicare Other | Admitting: Internal Medicine

## 2018-02-11 ENCOUNTER — Encounter: Payer: Self-pay | Admitting: Internal Medicine

## 2018-02-11 VITALS — BP 136/74 | HR 84 | Temp 98.7°F | Ht 69.0 in | Wt 198.0 lb

## 2018-02-11 DIAGNOSIS — I2510 Atherosclerotic heart disease of native coronary artery without angina pectoris: Secondary | ICD-10-CM | POA: Diagnosis not present

## 2018-02-11 DIAGNOSIS — Z0000 Encounter for general adult medical examination without abnormal findings: Secondary | ICD-10-CM

## 2018-02-11 DIAGNOSIS — Z7189 Other specified counseling: Secondary | ICD-10-CM | POA: Diagnosis not present

## 2018-02-11 DIAGNOSIS — J4520 Mild intermittent asthma, uncomplicated: Secondary | ICD-10-CM | POA: Diagnosis not present

## 2018-02-11 DIAGNOSIS — M545 Low back pain, unspecified: Secondary | ICD-10-CM

## 2018-02-11 DIAGNOSIS — I10 Essential (primary) hypertension: Secondary | ICD-10-CM

## 2018-02-11 DIAGNOSIS — Z85038 Personal history of other malignant neoplasm of large intestine: Secondary | ICD-10-CM | POA: Diagnosis not present

## 2018-02-11 LAB — COMPREHENSIVE METABOLIC PANEL
ALT: 30 U/L (ref 0–53)
AST: 27 U/L (ref 0–37)
Albumin: 4 g/dL (ref 3.5–5.2)
Alkaline Phosphatase: 92 U/L (ref 39–117)
BUN: 15 mg/dL (ref 6–23)
CO2: 30 meq/L (ref 19–32)
Calcium: 9.6 mg/dL (ref 8.4–10.5)
Chloride: 103 mEq/L (ref 96–112)
Creatinine, Ser: 1.19 mg/dL (ref 0.40–1.50)
GFR: 63.07 mL/min (ref >60.00)
GLUCOSE: 121 mg/dL — AB (ref 70–99)
POTASSIUM: 3.7 meq/L (ref 3.5–5.1)
Sodium: 140 mEq/L (ref 135–145)
Total Bilirubin: 0.9 mg/dL (ref 0.2–1.2)
Total Protein: 6.9 g/dL (ref 6.0–8.3)

## 2018-02-11 LAB — LIPID PANEL
CHOL/HDL RATIO: 3
Cholesterol: 112 mg/dL (ref 0–200)
HDL: 44.2 mg/dL (ref >39.00)
LDL Cholesterol: 52 mg/dL (ref 0–99)
NONHDL: 68.12
Triglycerides: 81 mg/dL (ref 0.0–149.0)
VLDL: 16.2 mg/dL (ref 0.0–40.0)

## 2018-02-11 LAB — CBC
HCT: 41.8 % (ref 39.0–52.0)
Hemoglobin: 14.8 g/dL (ref 13.0–17.0)
MCHC: 35.3 g/dL (ref 30.0–36.0)
MCV: 92.9 fl (ref 78.0–100.0)
Platelets: 233 10*3/uL (ref 150.0–400.0)
RBC: 4.5 Mil/uL (ref 4.22–5.81)
RDW: 13.4 % (ref 11.5–15.5)
WBC: 7.3 10*3/uL (ref 4.0–10.5)

## 2018-02-11 MED ORDER — ALBUTEROL SULFATE HFA 108 (90 BASE) MCG/ACT IN AERS
INHALATION_SPRAY | RESPIRATORY_TRACT | 3 refills | Status: DC
Start: 2018-02-11 — End: 2019-01-15

## 2018-02-11 NOTE — Progress Notes (Signed)
Hearing Screening Comments: Wears Hearing Aids Vision Screening Comments: January 2019

## 2018-02-11 NOTE — Assessment & Plan Note (Signed)
Seems mild Discussed analgesics and exercise

## 2018-02-11 NOTE — Assessment & Plan Note (Signed)
Has been quiet on Rx Will check labs

## 2018-02-11 NOTE — Assessment & Plan Note (Signed)
See social history 

## 2018-02-11 NOTE — Assessment & Plan Note (Signed)
Due for surveillance colon soon

## 2018-02-11 NOTE — Progress Notes (Signed)
Subjective:    Patient ID: Caleb Gray, male    DOB: 21-Oct-1941, 77 y.o.   MRN: [id number]  HPI Here for Medicare wellness visit and follow up of chronic health conditions With wife Reviewed form and advanced directives Reviewed other doctors Occasional beer or wine No tobacco Tries to exercise regularly. Needs to add resistance training No falls No depression or anhedonia Vision is okay Hearing aides Mild memory issues--nothing concerning Independent with instrumental ADLs  Having some trouble with his right thumb Locks and clicks Improving some Has tried cold water--it helps No decreased ROM  Trouble with his back when he gets up at night Bad back since back 9 years ago when he had surgery Mostly just stiffness---and it eases up Using the ibuprofen occasionally---  800mg  every other night or so (helps) Tylenol not really that helpful  No chest pain No SOB No dizziness or syncope No claudication, etc Mild edema---relates to amlodipine No palpitations  Asthma seems quiet Mostly with allergies Uses the inhaler every morning --keeps things quiet (discussed using spacer)  Current Outpatient Medications on File Prior to Visit  Medication Sig Dispense Refill  . albuterol (PROAIR HFA) 108 (90 Base) MCG/ACT inhaler USE 2 INHALATIONS FOUR TIMES A DAY AS NEEDED 54 g 3  . amLODipine (NORVASC) 10 MG tablet TAKE 1 TABLET DAILY 90 tablet 3  . aspirin 81 MG tablet Take 81 mg by mouth daily.      Marland Kitchen atorvastatin (LIPITOR) 20 MG tablet Take 1 tablet (20 mg total) by mouth at bedtime. 90 tablet 3  . enalapril (VASOTEC) 20 MG tablet TAKE 1 TABLET DAILY 90 tablet 2  . fluticasone (FLONASE) 50 MCG/ACT nasal spray Place 2 sprays into both nostrils daily.    Marland Kitchen ibuprofen (ADVIL,MOTRIN) 800 MG tablet TAKE 1 TABLET (800 MG TOTAL) BY MOUTH 2 (TWO) TIMES DAILY. 90 tablet 1  . Multiple Vitamins-Minerals (CENTRUM SILVER) tablet Take 1 tablet by mouth daily.      . Omega-3 Fatty Acids  (FISH OIL) 1000 MG CAPS Take by mouth 2 (two) times daily.      No current facility-administered medications on file prior to visit.     Allergies  Allergen Reactions  . Penicillins     Very dry and itchy skin, make throat "feel funny:    Past Medical History:  Diagnosis Date  . ANEMIA-NOS   . ASTHMA   . Chronic diastolic heart failure ([place])   . Colon cancer ([place])    [place] 1997 stage III T3, N1 s/p rectosigmoid resection  . CORONARY ARTERY DISEASE   . HYPERLIPIDEMIA   . HYPERTENSION   . Low back pain 10/2009   s/p laminectomy/decompression L4-5  . Right kidney mass 02/2007   benign (oncocytoma) s/p resection/total nephrectomy  . UNSPECIFIED PERIPHERAL VASCULAR DISEASE     Past Surgical History:  Procedure Laterality Date  . LUMBAR LAMINECTOMY/DECOMPRESSION MICRODISCECTOMY  10/2009  . NEPHRECTOMY  02/2007   Right  . Rectosigmoidectomy  1997    Family History  Problem Relation Age of Onset  . Colon cancer Mother   . Heart disease Father   . Alcohol abuse Other        Parents  . Esophageal cancer Neg Hx   . Rectal cancer Neg Hx   . Stomach cancer Neg Hx     Social History   Socioeconomic History  . Marital status: Married    Spouse name: Not on file  . Number of children: 2  . Years  of education: Not on file  . Highest education level: Not on file  Occupational History  . 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 Financial resource strain: Not on file  . Food insecurity:    Worry: Not on file    Inability: Not on file  . Transportation needs:    Medical: Not on file    Non-medical: Not on file  Tobacco Use  . Smoking status: Former Smoker    Types: Cigarettes    Last attempt to quit: 11/11/1968    Years since quitting: 49.2  . Smokeless tobacco: Never Used  Substance and Sexual Activity  . Alcohol use: Yes    Alcohol/week: 0.0 oz    Comment: occasionally  . Drug use: Not on file  . Sexual activity: Not on file  Lifestyle  .  Physical activity:    Days per week: Not on file    Minutes per session: Not on file  . Stress: Not on file  Relationships  . Social connections:    Talks on phone: Not on file    Gets together: Not on file    Attends religious service: Not on file    Active member of club or organization: Not on file    Attends meetings of clubs or organizations: Not on file    Relationship status: Not on file  . Intimate partner violence:    Fear of current or ex partner: Not on file    Emotionally abused: Not on file    Physically abused: Not on file    Forced sexual activity: Not on file  Other Topics Concern  . Not on file  Social History Narrative   Married, lives with wife, retired in 2007. Moved from [place] to [place] 04/2009      Has living will    Wife is health care POA-- daughter is alternate.   Would accept resuscitation   Not sure about tube feeds---might accept   Review of Systems No headaches Appetite is good Weight stable Sleeps okay for the most part No other sig joint issues Wears seat belt Teeth okay--due for dental visit Bowels fine--no blood. Due for colon in next year (fall or winter) Voids okay--flow is pretty good. Nocturia x 1-3 No skin problems    Objective:   Physical Exam  Constitutional: He is oriented to person, place, and time. He appears well-developed. No distress.  HENT:  Mouth/Throat: Oropharynx is clear and moist. No oropharyngeal exudate.  Neck: No thyromegaly present.  Cardiovascular: Normal rate, regular rhythm, normal heart sounds and intact distal pulses. Exam reveals no gallop.  No murmur heard. Pulmonary/Chest: Effort normal and breath sounds normal. No respiratory distress. He has no wheezes. He has no rales.  Abdominal: Soft. There is no tenderness.  Musculoskeletal: He exhibits no edema or tenderness.  Lymphadenopathy:    He has no cervical adenopathy.  Neurological: He is alert and oriented to person, place, and time.    President--- "Dwaine Deter, Bush" [phone] [id number] Recall 3/3  Skin: No rash noted. No erythema.  Psychiatric: He has a normal mood and affect. His behavior is normal.          Assessment & Plan:

## 2018-02-11 NOTE — Assessment & Plan Note (Signed)
Does well with the daily inhaler

## 2018-02-11 NOTE — Assessment & Plan Note (Signed)
I have personally reviewed the Medicare Annual Wellness questionnaire and have noted 1. The patient's medical and social history 2. Their use of alcohol, tobacco or illicit drugs 3. Their current medications and supplements 4. The patient's functional ability including ADL's, fall risks, home safety risks and hearing or visual             impairment. 5. Diet and physical activities 6. Evidence for depression or mood disorders  The patients weight, height, BMI and visual acuity have been recorded in the chart I have made referrals, counseling and provided education to the patient based review of the above and I have provided the pt with a written personalized care plan for preventive services.  I have provided you with a copy of your personalized plan for preventive services. Please take the time to review along with your updated medication list.  Yearly flu vaccine Due for colon soon Discussed exercise/resistance training Consider shingrix

## 2018-02-11 NOTE — Assessment & Plan Note (Signed)
BP Readings from Last 3 Encounters:  02/11/18 136/74  11/24/17 130/70  11/21/17 (!) 150/90   Good control

## 2018-03-23 ENCOUNTER — Encounter: Payer: Self-pay | Admitting: Internal Medicine

## 2018-03-23 MED ORDER — ATORVASTATIN CALCIUM 20 MG PO TABS: 20.0000 mg | ORAL_TABLET | Freq: Every day | ORAL | 3 refills | Status: DC

## 2018-04-18 ENCOUNTER — Encounter: Payer: Self-pay | Admitting: Internal Medicine

## 2018-04-20 MED ORDER — ENALAPRIL MALEATE 20 MG PO TABS: 20.0000 mg | ORAL_TABLET | Freq: Every day | ORAL | 3 refills | Status: DC

## 2018-05-04 ENCOUNTER — Other Ambulatory Visit: Payer: Self-pay | Admitting: Internal Medicine

## 2018-08-10 ENCOUNTER — Encounter: Payer: Self-pay | Admitting: Internal Medicine

## 2018-08-10 MED ORDER — AMLODIPINE BESYLATE 10 MG PO TABS: 10.0000 mg | ORAL_TABLET | Freq: Every day | ORAL | 3 refills | Status: DC

## 2018-08-28 DIAGNOSIS — Z23 Encounter for immunization: Secondary | ICD-10-CM | POA: Diagnosis not present

## 2018-09-09 ENCOUNTER — Ambulatory Visit (AMBULATORY_SURGERY_CENTER): Payer: Self-pay

## 2018-09-09 ENCOUNTER — Other Ambulatory Visit: Payer: Self-pay

## 2018-09-09 VITALS — Ht 70.0 in | Wt 197.8 lb

## 2018-09-09 DIAGNOSIS — Z85038 Personal history of other malignant neoplasm of large intestine: Secondary | ICD-10-CM

## 2018-09-09 DIAGNOSIS — Z800 Family history of malignant neoplasm of digestive organs: Secondary | ICD-10-CM

## 2018-09-09 MED ORDER — NA SULFATE-K SULFATE-MG SULF 17.5-3.13-1.6 GM/177ML PO SOLN: 1.0000 | Freq: Once | ORAL | 0 refills | Status: AC

## 2018-09-09 NOTE — Progress Notes (Signed)
No egg or soy allergy known to patient  No issues with past sedation with any surgeries  or procedures, no intubation problems  No diet pills per patient No home 02 use per patient  No blood thinners per patient  Pt denies issues with constipation  No A fib or A flutter  EMMI video sent to pt's e mail , pt declined    

## 2018-09-14 DIAGNOSIS — L820 Inflamed seborrheic keratosis: Secondary | ICD-10-CM | POA: Diagnosis not present

## 2018-09-14 DIAGNOSIS — I781 Nevus, non-neoplastic: Secondary | ICD-10-CM | POA: Diagnosis not present

## 2018-09-14 DIAGNOSIS — L814 Other melanin hyperpigmentation: Secondary | ICD-10-CM | POA: Diagnosis not present

## 2018-09-14 DIAGNOSIS — D2270 Melanocytic nevi of unspecified lower limb, including hip: Secondary | ICD-10-CM | POA: Diagnosis not present

## 2018-09-14 DIAGNOSIS — L578 Other skin changes due to chronic exposure to nonionizing radiation: Secondary | ICD-10-CM | POA: Diagnosis not present

## 2018-09-14 DIAGNOSIS — D2260 Melanocytic nevi of unspecified upper limb, including shoulder: Secondary | ICD-10-CM | POA: Diagnosis not present

## 2018-09-14 DIAGNOSIS — Z85828 Personal history of other malignant neoplasm of skin: Secondary | ICD-10-CM | POA: Diagnosis not present

## 2018-09-14 DIAGNOSIS — L821 Other seborrheic keratosis: Secondary | ICD-10-CM | POA: Diagnosis not present

## 2018-09-14 DIAGNOSIS — Z1283 Encounter for screening for malignant neoplasm of skin: Secondary | ICD-10-CM | POA: Diagnosis not present

## 2018-09-14 DIAGNOSIS — D225 Melanocytic nevi of trunk: Secondary | ICD-10-CM | POA: Diagnosis not present

## 2018-09-22 ENCOUNTER — Ambulatory Visit (AMBULATORY_SURGERY_CENTER): Payer: Medicare Other | Admitting: Internal Medicine

## 2018-09-22 ENCOUNTER — Encounter: Payer: Self-pay | Admitting: Internal Medicine

## 2018-09-22 VITALS — BP 121/79 | HR 81 | Temp 98.2°F | Resp 12 | Ht 69.0 in | Wt 198.0 lb

## 2018-09-22 DIAGNOSIS — Z85038 Personal history of other malignant neoplasm of large intestine: Secondary | ICD-10-CM

## 2018-09-22 DIAGNOSIS — I252 Old myocardial infarction: Secondary | ICD-10-CM | POA: Diagnosis not present

## 2018-09-22 DIAGNOSIS — I739 Peripheral vascular disease, unspecified: Secondary | ICD-10-CM | POA: Diagnosis not present

## 2018-09-22 DIAGNOSIS — J45909 Unspecified asthma, uncomplicated: Secondary | ICD-10-CM | POA: Diagnosis not present

## 2018-09-22 DIAGNOSIS — D120 Benign neoplasm of cecum: Secondary | ICD-10-CM

## 2018-09-22 DIAGNOSIS — D123 Benign neoplasm of transverse colon: Secondary | ICD-10-CM | POA: Diagnosis not present

## 2018-09-22 DIAGNOSIS — I10 Essential (primary) hypertension: Secondary | ICD-10-CM | POA: Diagnosis not present

## 2018-09-22 DIAGNOSIS — I2510 Atherosclerotic heart disease of native coronary artery without angina pectoris: Secondary | ICD-10-CM | POA: Diagnosis not present

## 2018-09-22 MED ORDER — SODIUM CHLORIDE 0.9 % IV SOLN: 500.0000 mL | Freq: Once | INTRAVENOUS | Status: DC

## 2018-09-22 NOTE — Progress Notes (Signed)
Report to PACU, RN, vss, BBS= Clear.  

## 2018-09-22 NOTE — Patient Instructions (Signed)
HANDOUT GIVEN : POLYPS  YOU HAD AN ENDOSCOPIC PROCEDURE TODAY AT [facility]:   Refer to the procedure report that was given to you for any specific questions about what was found during the examination.  If the procedure report does not answer your questions, please call your gastroenterologist to clarify.  If you requested that your care partner not be given the details of your procedure findings, then the procedure report has been included in a sealed envelope for you to review at your convenience later.  YOU SHOULD EXPECT: Some feelings of bloating in the abdomen. Passage of more gas than usual.  Walking can help get rid of the air that was put into your GI tract during the procedure and reduce the bloating. If you had a lower endoscopy (such as a colonoscopy or flexible sigmoidoscopy) you may notice spotting of blood in your stool or on the toilet paper. If you underwent a bowel prep for your procedure, you may not have a normal bowel movement for a few days.  Please Note:  You might notice some irritation and congestion in your nose or some drainage.  This is from the oxygen used during your procedure.  There is no need for concern and it should clear up in a day or so.  SYMPTOMS TO REPORT IMMEDIATELY:   Following lower endoscopy (colonoscopy or flexible sigmoidoscopy):  Excessive amounts of blood in the stool  Significant tenderness or worsening of abdominal pains  Swelling of the abdomen that is new, acute  Fever of 100F or higher   For urgent or emergent issues, a gastroenterologist can be reached at any hour by calling [phone].   DIET:  We do recommend a small meal at first, but then you may proceed to your regular diet.  Drink plenty of fluids but you should avoid alcoholic beverages for 24 hours.  ACTIVITY:  You should plan to take it easy for the rest of today and you should NOT DRIVE or use heavy machinery until tomorrow (because of the sedation medicines  used during the test).    FOLLOW UP: Our staff will call the number listed on your records the next business day following your procedure to check on you and address any questions or concerns that you may have regarding the information given to you following your procedure. If we do not reach you, we will leave a message.  However, if you are feeling well and you are not experiencing any problems, there is no need to return our call.  We will assume that you have returned to your regular daily activities without incident.  If any biopsies were taken you will be contacted by phone or by letter within the next 1-3 weeks.  Please call us at [phone] if you have not heard about the biopsies in 3 weeks.    SIGNATURES/CONFIDENTIALITY: You and/or your care partner have signed paperwork which will be entered into your electronic medical record.  These signatures attest to the fact that that the information above on your After Visit Summary has been reviewed and is understood.  Full responsibility of the confidentiality of this discharge information lies with you and/or your care-partner.

## 2018-09-22 NOTE — Op Note (Signed)
[facility] Patient Name: Caleb Gray Procedure Date: 09/22/2018 10:39 AM MRN: [id number] Endoscopist: Docia Chuck. Henrene Pastor , MD Age: 77 Referring MD:  Date of Birth: 05-01-1941 Gender: Male Account #: [id number] Procedure:                Colonoscopy with cold snare polypectomy x 4 Indications:              High risk colon cancer surveillance: Personal                            history of sessile serrated colon polyp (less than                            10 mm in size) with no dysplasia, High risk colon                            cancer surveillance: Personal history of colon                            cancer. Diagnosed 1997 with colon cancer status                            post surgical resection in [place] (T3, N1). Most                            recent surveillance examinations here 2009 (-) in                            2014 (small SSP) Medicines:                Monitored Anesthesia Care Procedure:                Pre-Anesthesia Assessment:                           - Prior to the procedure, a History and Physical                            was performed, and patient medications and                            allergies were reviewed. The patient's tolerance of                            previous anesthesia was also reviewed. The risks                            and benefits of the procedure and the sedation                            options and risks were discussed with the patient.                            All questions were answered, and informed consent  was obtained. Prior Anticoagulants: The patient has                            taken no previous anticoagulant or antiplatelet                            agents. ASA Grade Assessment: II - A patient with                            mild systemic disease. After reviewing the risks                            and benefits, the patient was deemed in                            satisfactory  condition to undergo the procedure.                           After obtaining informed consent, the colonoscope                            was passed under direct vision. Throughout the                            procedure, the patient's blood pressure, pulse, and                            oxygen saturations were monitored continuously. The                            Colonoscope was introduced through the anus and                            advanced to the the cecum, identified by                            appendiceal orifice and ileocecal valve. The                            ileocecal valve, appendiceal orifice, and rectum                            were photographed. The quality of the bowel                            preparation was good. The colonoscopy was performed                            without difficulty. The patient tolerated the                            procedure well. The bowel preparation used was  SUPREP. Scope In: 10:48:00 AM Scope Out: 11:01:45 AM Scope Withdrawal Time: 0 hours 11 minutes 15 seconds  Total Procedure Duration: 0 hours 13 minutes 45 seconds  Findings:                 Four polyps were found in the transverse colon and                            cecum. The polyps were 2 to 5 mm in size. These                            polyps were removed with a cold snare. Resection                            and retrieval were complete.                           Multiple diverticula were found in the sigmoid                            colon and a few in the transverse colon.                           Internal hemorrhoids were found during retroflexion.                           Rectosigmoid anastomosis unremarkable. The exam was                            otherwise without abnormality on direct and                            retroflexion views. Complications:            No immediate complications. Estimated blood loss:                             None. Estimated Blood Loss:     Estimated blood loss: none. Impression:               - Four 2 to 5 mm polyps in the transverse colon and                            in the cecum, removed with a cold snare. Resected                            and retrieved.                           - Diverticulosis in the sigmoid and transverse                            colon.                           - Internal hemorrhoids.                           -  The examination was otherwise normal on direct                            and retroflexion views. Recommendation:           - Repeat colonoscopy in 3 - 5 years for                            surveillance.                           - Patient has a contact number available for                            emergencies. The signs and symptoms of potential                            delayed complications were discussed with the                            patient. Return to normal activities tomorrow.                            Written discharge instructions were provided to the                            patient.                           - Resume previous diet.                           - Continue present medications.                           - Await pathology results. Docia Chuck. Henrene Pastor, MD 09/22/2018 11:08:39 AM This report has been signed electronically.

## 2018-09-22 NOTE — Progress Notes (Signed)
Pt's states no medical or surgical changes since previsit or office visit. 

## 2018-09-22 NOTE — Progress Notes (Signed)
Called to room to assist during endoscopic procedure.  Patient ID and intended procedure confirmed with present staff. Received instructions for my participation in the procedure from the performing physician.  

## 2018-09-23 ENCOUNTER — Telehealth: Payer: Self-pay

## 2018-09-23 NOTE — Telephone Encounter (Signed)
  Follow up Call-  Call back number 09/22/2018  Post procedure Call Back phone  # [phone]  Permission to leave phone message Yes  Some recent data might be hidden     Patient questions:  Do you have a fever, pain , or abdominal swelling? No. Pain Score  0 *  Have you tolerated food without any problems? Yes.    Have you been able to return to your normal activities? Yes.    Do you have any questions about your discharge instructions: Diet   No. Medications  No. Follow up visit  No.  Do you have questions or concerns about your Care? No.  Actions: * If pain score is 4 or above: No action needed, pain <4.  No problems noted per pt. maw

## 2018-09-25 ENCOUNTER — Encounter: Payer: Self-pay | Admitting: Internal Medicine

## 2018-12-02 ENCOUNTER — Ambulatory Visit: Payer: Medicare Other | Admitting: Cardiology

## 2018-12-15 ENCOUNTER — Ambulatory Visit (INDEPENDENT_AMBULATORY_CARE_PROVIDER_SITE_OTHER): Payer: Medicare Other | Admitting: Family Medicine

## 2018-12-15 ENCOUNTER — Encounter: Payer: Self-pay | Admitting: Family Medicine

## 2018-12-15 DIAGNOSIS — J069 Acute upper respiratory infection, unspecified: Secondary | ICD-10-CM

## 2018-12-15 NOTE — Patient Instructions (Addendum)
Likely a viral infection that should resolve.  Continue the inhaler in the morning- okay to use again right before bed.  If you are feeling worse, then let us know.   The eye discharge should gradually get better.   Take care.  Glad to see you.

## 2018-12-15 NOTE — Progress Notes (Signed)
At baseline he uses SABA qAM, even when feeling well.    He had ST that resolved in 10/2018.    Then ST returned about 4 days ago.  Was stuffy.  He "fought it off for a few days."  2 days ago with dark sputum, occ green sputum.  He had B eye discharge and crusting, R eye initially, now with L eye also affected.  Yesterday with R ear felt clogged, better today.   He doesn't feel back to baseline but doesn't feel unwell o/w.    Meds, vitals, and allergies reviewed.   ROS: Per HPI unless specifically indicated in ROS section   GEN: nad, alert and oriented HEENT: mucous membranes moist, tm w/o erythema, B SOM noted, B conjunctival injection.  nasal exam w/o erythema, clear discharge noted,  OP with cobblestoning NECK: supple w/o LA CV: rrr.   PULM: ctab, no inc wob EXT: no edema SKIN: no acute rash

## 2018-12-16 DIAGNOSIS — J069 Acute upper respiratory infection, unspecified: Secondary | ICD-10-CM | POA: Insufficient documentation

## 2018-12-16 NOTE — Assessment & Plan Note (Signed)
Likely a viral infection that should resolve.  Continue SABA in the morning- okay to use again right before bed.  If feeling worse, then let us know.   The eye discharge should gradually get better.   Okay for outpatient follow-up.  He agrees with plan.

## 2019-01-14 ENCOUNTER — Telehealth: Payer: Self-pay

## 2019-01-14 NOTE — Telephone Encounter (Signed)
Pt returning your call

## 2019-01-14 NOTE — Telephone Encounter (Signed)
Left message to call office. We do not send in Ventolin, we send in generic albuterol that usually says Proair. Which albuterol does his insurance prefer? We will have to change it to that.Marland Kitchen

## 2019-01-14 NOTE — Telephone Encounter (Signed)
Pt request note to [facility] CMA; pt ordered ventolin thru [facility] and pt was advised needs prior auth. Pt request call [phone] for PA for ventolin. Pt request cb when completed.

## 2019-01-14 NOTE — Telephone Encounter (Signed)
Spoke to pt. Advised him to contact his pharmacy benefits plan and find out which albuterol inhaler they cover so that I can send in the correct one. He will let me know.

## 2019-01-15 MED ORDER — ALBUTEROL SULFATE 108 (90 BASE) MCG/ACT IN AEPB: 2.0000 | INHALATION_SPRAY | Freq: Four times a day (QID) | RESPIRATORY_TRACT | 3 refills | Status: DC | PRN

## 2019-01-15 NOTE — Telephone Encounter (Signed)
Pt sent a MyChart that his insurance covers Proair. Rx was sent in the MyChart message.

## 2019-01-27 ENCOUNTER — Telehealth: Payer: Self-pay

## 2019-01-27 NOTE — Telephone Encounter (Signed)
Mr. Ketelsen has an appointment 3/23. Called to see how he is doing and to see if he could postpone appointment to May in light of COVID-19 pandemic.  Left message to call back.

## 2019-01-27 NOTE — Telephone Encounter (Signed)
Mr. Costlow states he feels great and is happy to postpone his appointment in light of the coronavirus pandemic.  Per request, scheduled him in June with Dr. Marlou Porch. He understands to call prior to that time if he has any questions or concerns. He was grateful for assistance.

## 2019-02-01 ENCOUNTER — Ambulatory Visit: Payer: Medicare Other | Admitting: Cardiology

## 2019-02-17 ENCOUNTER — Encounter: Payer: Medicare Other | Admitting: Internal Medicine

## 2019-03-16 MED ORDER — ATORVASTATIN CALCIUM 20 MG PO TABS: 20.0000 mg | ORAL_TABLET | Freq: Every day | ORAL | 3 refills | Status: DC

## 2019-03-16 MED ORDER — ENALAPRIL MALEATE 20 MG PO TABS: 20.0000 mg | ORAL_TABLET | Freq: Every day | ORAL | 3 refills | Status: DC

## 2019-04-20 ENCOUNTER — Telehealth (INDEPENDENT_AMBULATORY_CARE_PROVIDER_SITE_OTHER): Payer: Medicare Other | Admitting: Cardiology

## 2019-04-20 ENCOUNTER — Other Ambulatory Visit: Payer: Self-pay

## 2019-04-20 ENCOUNTER — Other Ambulatory Visit: Payer: Self-pay | Admitting: Internal Medicine

## 2019-04-20 ENCOUNTER — Encounter: Payer: Self-pay | Admitting: Cardiology

## 2019-04-20 VITALS — BP 139/78 | HR 89 | Ht 69.0 in | Wt 183.0 lb

## 2019-04-20 DIAGNOSIS — I2510 Atherosclerotic heart disease of native coronary artery without angina pectoris: Secondary | ICD-10-CM

## 2019-04-20 DIAGNOSIS — E782 Mixed hyperlipidemia: Secondary | ICD-10-CM | POA: Diagnosis not present

## 2019-04-20 DIAGNOSIS — I10 Essential (primary) hypertension: Secondary | ICD-10-CM

## 2019-04-20 NOTE — Progress Notes (Signed)
Virtual Visit via Video Note   This visit type was conducted due to national recommendations for restrictions regarding the COVID-19 Pandemic (e.g. social distancing) in an effort to limit this patient's exposure and mitigate transmission in our community.  Due to his co-morbid illnesses, this patient is at least at moderate risk for complications without adequate follow up.  This format is felt to be most appropriate for this patient at this time.  All issues noted in this document were discussed and addressed.  A limited physical exam was performed with this format.  Please refer to the patient's chart for his consent to telehealth for [facility].   Date:  04/20/2019   ID:  Caleb Gray, DOB [birth year], MRN [id number]  Patient Location: Home Provider Location: Home  PCP:  [name], MD  Cardiologist:  Candee Furbish, MD  Electrophysiologist:  None   Evaluation Performed:  Follow-Up Visit  Chief Complaint:  CAD follow up  History of Present Illness:    Caleb Gray is a 78 y.o. male with coronary artery disease, medical management for cardiac catheterization demonstrated 70% mid LAD, 90% distal RCA given lack of symptoms. Low risk Myoview in 02-07-2008. Walks golfs. Echocardiogram with normal ejection fraction, normal RV. Complaint of low back pain has been chronic.  He also has a history of colon cancer, hypertension, right nephrectomy in 2007/02/07.  Wife died pancreatic cancer for 5 years. In 2//26/2020 died. Hiatal hernia. Surgery 17 days in hospital. 10 days later at home died. Acute resp syndrome. He was wondering if could have been associated with COVID-19.  Had cough in 12/2018.   Had some left hearing loss, possible neuritis. On prednisone. ENT.  The patient does not have symptoms concerning for COVID-19 infection (fever, chills, cough, or new shortness of breath).    Past Medical History:  Diagnosis Date  . ANEMIA-NOS   . Arthritis    back  . ASTHMA   .  Blood transfusion without reported diagnosis    12 years ago when had surgery for kidney mass  . Cataract   . Chronic diastolic heart failure ([place])   . Colon cancer ([place])    [place] 1997 stage III T3, N1 s/p rectosigmoid resection  . CORONARY ARTERY DISEASE   . HYPERLIPIDEMIA   . HYPERTENSION   . Low back pain 10/2009   s/p laminectomy/decompression L4-5  . Myocardial infarction ([place])   . Right kidney mass 02/2007   benign (oncocytoma) s/p resection/total nephrectomy  . UNSPECIFIED PERIPHERAL VASCULAR DISEASE    Past Surgical History:  Procedure Laterality Date  . COLONOSCOPY    . LUMBAR LAMINECTOMY/DECOMPRESSION MICRODISCECTOMY  10/2009  . NEPHRECTOMY  02/2007   Right  . POLYPECTOMY    . Rectosigmoidectomy  1997     Current Meds  Medication Sig  . Albuterol Sulfate (PROAIR RESPICLICK) 563 (90 Base) MCG/ACT AEPB Take 2 puffs by mouth 4 (four) times daily as needed.  Marland Kitchen amLODipine (NORVASC) 10 MG tablet Take 1 tablet (10 mg total) by mouth daily.  Marland Kitchen aspirin 81 MG tablet Take 81 mg by mouth daily.    Marland Kitchen atorvastatin (LIPITOR) 20 MG tablet Take 1 tablet (20 mg total) by mouth at bedtime.  . enalapril (VASOTEC) 20 MG tablet Take 1 tablet (20 mg total) by mouth daily.  Marland Kitchen ibuprofen (ADVIL,MOTRIN) 800 MG tablet TAKE 1 TABLET (800 MG TOTAL) BY MOUTH 2 (TWO) TIMES DAILY.  . Multiple Vitamins-Minerals (CENTRUM SILVER 50+MEN PO) Take by mouth.  . Omega-3  Fatty Acids (FISH OIL) 1000 MG CAPS Take by mouth 2 (two) times daily.      Allergies:   Penicillins   Social History   Tobacco Use  . Smoking status: Former Smoker    Types: Cigarettes    Last attempt to quit: 11/11/1968    Years since quitting: 50.4  . Smokeless tobacco: Never Used  Substance Use Topics  . Alcohol use: Yes    Alcohol/week: 0.0 standard drinks    Comment: occasionally  . Drug use: Never     Family Hx: The patient's family history includes Alcohol abuse in an other family member; Colon cancer in his mother;  Heart disease in his father. There is no history of Esophageal cancer, Rectal cancer, or Stomach cancer.  ROS:   Please see the history of present illness.    No fever, chills, sob, cp, n/v All other systems reviewed and are negative.   Prior CV studies:   The following studies were reviewed today:  ECHO 06/2014  - EF 60-65%, mild LVH, normal RV size and systolic function.    ETT-myoview 8/09  - fixed deficit at basal inferior segment (small), low risk.   Cath 02/2007 in [place]:  - 70% mid LAD, 90% dist RCA managed medically   Labs/Other Tests and Data Reviewed:    EKG:  see below.   NSR - no difficulty 11/21/17.  Recent Labs: No results found for requested labs within last 8760 hours.   Recent Lipid Panel Lab Results  Component Value Date/Time   CHOL 112 02/11/2018 11:39 AM   TRIG 81.0 02/11/2018 11:39 AM   TRIG 79 03/08/2009   HDL 44.20 02/11/2018 11:39 AM   CHOLHDL 3 02/11/2018 11:39 AM   LDLCALC 52 02/11/2018 11:39 AM   LDLCALC 62 03/08/2009   LDLDIRECT 159.7 11/28/2009 09:18 AM    Wt Readings from Last 3 Encounters:  04/20/19 183 lb (83 kg)  09/22/18 198 lb (89.8 kg)  09/09/18 197 lb 12.8 oz (89.7 kg)     Objective:    Vital Signs:  BP 139/78   Pulse 89   Ht 5\' 9"  (1.753 m)   Wt 183 lb (83 kg)   BMI 27.02 kg/m    VITAL SIGNS:  reviewed GEN:  no acute distress EYES:  sclerae anicteric, EOMI - Extraocular Movements Intact RESPIRATORY:  normal respiratory effort, symmetric expansion SKIN:  no rash, lesions or ulcers. MUSCULOSKELETAL:  no obvious deformities. NEURO:  alert and oriented x 3, no obvious focal deficit PSYCH:  normal affect  ASSESSMENT & PLAN:    HYPERLIPIDEMIA LDL was at goal (<70), LDL 52 2019.  Excellent.  His wife had trouble with statin intolerance in the past.  He has been taking his atorvastatin for 15 years.  Doing quite well with it. No changes made. No myalgias   CORONARY ARTERY DISEASE  Stable with no ischemic  symptoms. Preserved EF on 8/15 echo. Continue ASA, statin, enalapril.  See catheterization above.  Doing very well.  Described his coronary plaques. No angina. Continue with secondary prevention   HYPERTENSION  BP is controlled. His blood pressure cuff at home usually reads between 120/80.  Sometimes it can very rarely go to 145. Overall stable on amlodipine and ACE-I.   BACK PAIN Has been an issue. Better now.  COVID-19 Education: The signs and symptoms of COVID-19 were discussed with the patient and how to seek care for testing (follow up with PCP or arrange E-visit).  The importance of social  distancing was discussed today.  Time:   Today, I have spent 20 minutes with the patient with telehealth technology discussing the above problems.     Medication Adjustments/Labs and Tests Ordered: Current medicines are reviewed at length with the patient today.  Concerns regarding medicines are outlined above.   Tests Ordered: No orders of the defined types were placed in this encounter.   Medication Changes: No orders of the defined types were placed in this encounter.   Disposition:  Follow up in 1 year(s)  Signed, Candee Furbish, MD  04/20/2019 9:35 AM    [facility]

## 2019-04-20 NOTE — Patient Instructions (Signed)
  Medication Instructions:  The current medical regimen is effective;  continue present plan and medications.  If you need a refill on your cardiac medications before your next appointment, please call your pharmacy.   Follow-Up: Follow up in 1 year with Dr. Skains.  You will receive a letter in the mail 2 months before you are due.  Please call us when you receive this letter to schedule your follow up appointment.  Thank you for choosing [facility]!!     

## 2019-05-13 ENCOUNTER — Other Ambulatory Visit: Payer: Self-pay | Admitting: Internal Medicine

## 2019-06-03 ENCOUNTER — Encounter: Payer: Self-pay | Admitting: Internal Medicine

## 2019-06-03 ENCOUNTER — Ambulatory Visit (INDEPENDENT_AMBULATORY_CARE_PROVIDER_SITE_OTHER): Payer: Medicare Other | Admitting: Internal Medicine

## 2019-06-03 ENCOUNTER — Other Ambulatory Visit: Payer: Self-pay

## 2019-06-03 VITALS — BP 140/86 | HR 90 | Temp 98.5°F | Ht 69.0 in | Wt 189.0 lb

## 2019-06-03 DIAGNOSIS — E785 Hyperlipidemia, unspecified: Secondary | ICD-10-CM | POA: Diagnosis not present

## 2019-06-03 DIAGNOSIS — I2510 Atherosclerotic heart disease of native coronary artery without angina pectoris: Secondary | ICD-10-CM

## 2019-06-03 DIAGNOSIS — I10 Essential (primary) hypertension: Secondary | ICD-10-CM

## 2019-06-03 DIAGNOSIS — Z7189 Other specified counseling: Secondary | ICD-10-CM | POA: Diagnosis not present

## 2019-06-03 DIAGNOSIS — Z0000 Encounter for general adult medical examination without abnormal findings: Secondary | ICD-10-CM

## 2019-06-03 DIAGNOSIS — J4520 Mild intermittent asthma, uncomplicated: Secondary | ICD-10-CM

## 2019-06-03 NOTE — Assessment & Plan Note (Signed)
See social history 

## 2019-06-03 NOTE — Assessment & Plan Note (Signed)
BP Readings from Last 3 Encounters:  06/03/19 140/86  04/20/19 139/78  12/15/18 (!) 148/64   Reasonable control Due for labs

## 2019-06-03 NOTE — Assessment & Plan Note (Signed)
Using the albuterol with no symptoms

## 2019-06-03 NOTE — Progress Notes (Addendum)
Subjective:    Patient ID: Caleb Gray, male    DOB: 1941-06-28, 78 y.o.   MRN: [id number]  HPI Here for Medicare wellness visit and follow up of chronic health conditions Reviewed form and advanced directives Reviewed other doctors 1 beer some nights No tobacco Not really exercising much--trying to increase Vision is fine Uses hearing aides--they help No falls Has been grieving ---so some depression. 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 Albuterol Sulfate (PROAIR RESPICLICK) 025 (90 Base) MCG/ACT AEPB Take 2 puffs by mouth 4 (four) times daily as needed. 3 each 3  . amLODipine (NORVASC) 10 MG tablet Take 1 tablet (10 mg total) by mouth daily. 90 tablet 3  . aspirin 81 MG tablet Take 81 mg by mouth daily.      Marland Kitchen atorvastatin (LIPITOR) 20 MG tablet Take 1 tablet (20 mg total) by mouth at bedtime. 90 tablet 3  . enalapril (VASOTEC) 20 MG tablet Take 1 tablet (20 mg total) by mouth daily. 90 tablet 3  . ibuprofen (ADVIL) 800 MG tablet TAKE 1 TABLET (800 MG TOTAL) BY MOUTH 2 (TWO) TIMES DAILY. 60 tablet 1  . Multiple Vitamins-Minerals (CENTRUM SILVER 50+MEN PO) Take by mouth.    . Omega-3 Fatty Acids (FISH OIL) 1000 MG CAPS Take by mouth 2 (two) times daily.      No current facility-administered medications on file prior to visit.     Allergies  Allergen  Reactions  . Penicillins     Very dry and itchy skin, make throat "feel funny:    Past Medical History:  Diagnosis Date  . ANEMIA-NOS   . Arthritis    back  . ASTHMA   . Blood transfusion without reported diagnosis    12 years ago when had surgery for kidney mass  . Cataract   . Chronic diastolic heart failure ([place])   . Colon cancer ([place])    [place] 1997 stage III T3, N1 s/p rectosigmoid resection  . CORONARY ARTERY DISEASE   . HYPERLIPIDEMIA   . HYPERTENSION   . Low back pain 10/2009   s/p laminectomy/decompression L4-5  . Myocardial infarction ([place])   . Right kidney mass 02/2007   benign (oncocytoma) s/p resection/total nephrectomy  . UNSPECIFIED PERIPHERAL VASCULAR DISEASE     Past Surgical History:  Procedure Laterality Date  . COLONOSCOPY    . LUMBAR LAMINECTOMY/DECOMPRESSION MICRODISCECTOMY  10/2009  . NEPHRECTOMY  02/2007   Right  . POLYPECTOMY    . Rectosigmoidectomy  1997    Family History  Problem Relation Age of Onset  . Colon cancer Mother   . Heart disease Father   . Alcohol abuse Other        Parents  . Esophageal cancer Neg Hx   . Rectal cancer Neg Hx   . Stomach cancer Neg Hx  Social History   Socioeconomic History  . Marital status: Widowed    Spouse name: Not on file  . Number of children: 2  . Years of education: Not on file  . Highest education level: Not on file  Occupational History  . 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 Financial resource strain: Not on file  . Food insecurity    Worry: Not on file    Inability: Not on file  . Transportation needs    Medical: Not on file    Non-medical: Not on file  Tobacco Use  . Smoking status: Former Smoker    Types: Cigarettes    Quit date: 11/11/1968    Years since quitting: 50.5  . Smokeless tobacco: Never Used  Substance and Sexual Activity  . Alcohol use: Yes    Alcohol/week: 0.0 standard drinks    Comment: occasionally  . Drug use: Never  . Sexual  activity: Not on file  Lifestyle  . Physical activity    Days per week: Not on file    Minutes per session: Not on file  . Stress: Not on file  Relationships  . Social Herbalist on phone: Not on file    Gets together: Not on file    Attends religious service: Not on file    Active member of club or organization: Not on file    Attends meetings of clubs or organizations: Not on file    Relationship status: Not on file  . Intimate partner violence    Fear of current or ex partner: Not on file    Emotionally abused: Not on file    Physically abused: Not on file    Forced sexual activity: Not on file  Other Topics Concern  . Not on file  Social History Narrative   Moved from [place]   Widowed 2/20      Has living will    Daughter is health care POA   Would accept resuscitation   Not sure about tube feeds---might accept   Review of Systems Appetite is fine Weight is down 20# and now has gained some of that back Sleeps okay Wears seat belt Teeth are fine Bowels are fine--no blood.  Voids okay--slow stream. Empties okay. Nocturia x 1-2 Occasional heartburn--doesn't use meds. No dysphagia Keeps up with derm--no current lesions of concern    Objective:   Physical Exam  Constitutional: He is oriented to person, place, and time. He appears well-developed. No distress.  HENT:  Mouth/Throat: Oropharynx is clear and moist. No oropharyngeal exudate.  Full upper plate  Neck: No thyromegaly present.  Cardiovascular: Normal rate, regular rhythm, normal heart sounds and intact distal pulses. Exam reveals no gallop.  No murmur heard. occ skips  Respiratory: Effort normal and breath sounds normal. No respiratory distress. He has no wheezes. He has no rales.  GI: Soft. There is no abdominal tenderness.  Musculoskeletal:        General: No tenderness or edema.  Lymphadenopathy:    He has no cervical adenopathy.  Neurological: He is alert and oriented to person,  place, and time.  President--- "Gelene Mink." [id number]-l-[id number]-w Recall 3/3  Skin: No rash noted. No erythema.  Psychiatric: He has a normal mood and affect. His behavior is normal.           Assessment & Plan:

## 2019-06-03 NOTE — Assessment & Plan Note (Signed)
No symptoms now Keeps up with cardiology

## 2019-06-03 NOTE — Assessment & Plan Note (Addendum)
I have personally reviewed the Medicare Annual Wellness questionnaire and have noted 1. The patient's medical and social history 2. Their use of alcohol, tobacco or illicit drugs 3. Their current medications and supplements 4. The patient's functional ability including ADL's, fall risks, home safety risks and hearing or visual             impairment. 5. Diet and physical activities 6. Evidence for depression or mood disorders  The patients weight, height, BMI and visual acuity have been recorded in the chart I have made referrals, counseling and provided education to the patient based review of the above and I have provided the pt with a written personalized care plan for preventive services.  I have provided you with a copy of your personalized plan for preventive services. Please take the time to review along with your updated medication list.  Annual flu vaccine May do one more colonoscopy--but not clear cut due to his age Discussed fitness Will get Td booster if he has injury

## 2019-06-03 NOTE — Assessment & Plan Note (Signed)
Doing okay with secondary prevention

## 2019-06-04 LAB — LIPID PANEL
Cholesterol: 124 mg/dL (ref 0–200)
HDL: 48 mg/dL (ref >39.00)
LDL Cholesterol: 59 mg/dL (ref 0–99)
NonHDL: 76.03
Total CHOL/HDL Ratio: 3
Triglycerides: 86 mg/dL (ref 0.0–149.0)
VLDL: 17.2 mg/dL (ref 0.0–40.0)

## 2019-06-04 LAB — CBC
HCT: 40.9 % (ref 39.0–52.0)
Hemoglobin: 14.2 g/dL (ref 13.0–17.0)
MCHC: 34.8 g/dL (ref 30.0–36.0)
MCV: 94.7 fl (ref 78.0–100.0)
Platelets: 235 10*3/uL (ref 150.0–400.0)
RBC: 4.32 Mil/uL (ref 4.22–5.81)
RDW: 14 % (ref 11.5–15.5)
WBC: 8.3 10*3/uL (ref 4.0–10.5)

## 2019-06-04 LAB — COMPREHENSIVE METABOLIC PANEL
ALT: 35 U/L (ref 0–53)
AST: 30 U/L (ref 0–37)
Albumin: 4.4 g/dL (ref 3.5–5.2)
Alkaline Phosphatase: 82 U/L (ref 39–117)
BUN: 21 mg/dL (ref 6–23)
CO2: 29 mEq/L (ref 19–32)
Calcium: 10 mg/dL (ref 8.4–10.5)
Chloride: 103 mEq/L (ref 96–112)
Creatinine, Ser: 1.27 mg/dL (ref 0.40–1.50)
GFR: 54.86 mL/min — ABNORMAL LOW (ref >60.00)
Glucose, Bld: 110 mg/dL — ABNORMAL HIGH (ref 70–99)
Potassium: 4 mEq/L (ref 3.5–5.1)
Sodium: 140 mEq/L (ref 135–145)
Total Bilirubin: 0.7 mg/dL (ref 0.2–1.2)
Total Protein: 6.6 g/dL (ref 6.0–8.3)

## 2019-08-16 DIAGNOSIS — Z23 Encounter for immunization: Secondary | ICD-10-CM | POA: Diagnosis not present

## 2019-08-16 MED ORDER — AMLODIPINE BESYLATE 10 MG PO TABS: 10.0000 mg | ORAL_TABLET | Freq: Every day | ORAL | 3 refills | Status: DC

## 2019-10-11 ENCOUNTER — Other Ambulatory Visit: Payer: Self-pay | Admitting: Internal Medicine

## 2019-11-10 ENCOUNTER — Other Ambulatory Visit: Payer: Self-pay

## 2019-11-10 ENCOUNTER — Emergency Department: Payer: Medicare Other

## 2019-11-10 ENCOUNTER — Emergency Department
Admission: EM | Admit: 2019-11-10 | Discharge: 2019-11-11 | Disposition: A | Discharge status: Home / Self Care | Payer: Medicare Other | Attending: Emergency Medicine | Admitting: Emergency Medicine

## 2019-11-10 ENCOUNTER — Encounter: Payer: Self-pay | Admitting: Emergency Medicine

## 2019-11-10 DIAGNOSIS — Z79899 Other long term (current) drug therapy: Secondary | ICD-10-CM | POA: Diagnosis not present

## 2019-11-10 DIAGNOSIS — Z87891 Personal history of nicotine dependence: Secondary | ICD-10-CM | POA: Diagnosis not present

## 2019-11-10 DIAGNOSIS — F419 Anxiety disorder, unspecified: Secondary | ICD-10-CM | POA: Insufficient documentation

## 2019-11-10 DIAGNOSIS — F439 Reaction to severe stress, unspecified: Secondary | ICD-10-CM

## 2019-11-10 DIAGNOSIS — Z7982 Long term (current) use of aspirin: Secondary | ICD-10-CM | POA: Insufficient documentation

## 2019-11-10 DIAGNOSIS — H538 Other visual disturbances: Secondary | ICD-10-CM | POA: Insufficient documentation

## 2019-11-10 DIAGNOSIS — I10 Essential (primary) hypertension: Secondary | ICD-10-CM | POA: Diagnosis not present

## 2019-11-10 DIAGNOSIS — I2510 Atherosclerotic heart disease of native coronary artery without angina pectoris: Secondary | ICD-10-CM | POA: Insufficient documentation

## 2019-11-10 DIAGNOSIS — J45909 Unspecified asthma, uncomplicated: Secondary | ICD-10-CM | POA: Diagnosis not present

## 2019-11-10 DIAGNOSIS — R29818 Other symptoms and signs involving the nervous system: Secondary | ICD-10-CM | POA: Diagnosis not present

## 2019-11-10 DIAGNOSIS — I110 Hypertensive heart disease with heart failure: Secondary | ICD-10-CM | POA: Insufficient documentation

## 2019-11-10 DIAGNOSIS — H539 Unspecified visual disturbance: Secondary | ICD-10-CM

## 2019-11-10 DIAGNOSIS — I5032 Chronic diastolic (congestive) heart failure: Secondary | ICD-10-CM | POA: Insufficient documentation

## 2019-11-10 LAB — DIFFERENTIAL
Abs Immature Granulocytes: 0.04 10*3/uL (ref 0.00–0.07)
Basophils Absolute: 0.1 10*3/uL (ref 0.0–0.1)
Basophils Relative: 1 %
Eosinophils Absolute: 0.6 10*3/uL — ABNORMAL HIGH (ref 0.0–0.5)
Eosinophils Relative: 6 %
Immature Granulocytes: 0 %
Lymphocytes Relative: 28 %
Lymphs Abs: 2.6 10*3/uL (ref 0.7–4.0)
Monocytes Absolute: 0.8 10*3/uL (ref 0.1–1.0)
Monocytes Relative: 9 %
Neutro Abs: 5.3 10*3/uL (ref 1.7–7.7)
Neutrophils Relative %: 56 %

## 2019-11-10 LAB — PROTIME-INR
INR: 1 (ref 0.8–1.2)
Prothrombin Time: 13.4 seconds (ref 11.4–15.2)

## 2019-11-10 LAB — COMPREHENSIVE METABOLIC PANEL
ALT: 48 U/L — ABNORMAL HIGH (ref 0–44)
AST: 40 U/L (ref 15–41)
Albumin: 4.5 g/dL (ref 3.5–5.0)
Alkaline Phosphatase: 91 U/L (ref 38–126)
Anion gap: 10 (ref 5–15)
BUN: 18 mg/dL (ref 8–23)
CO2: 23 mmol/L (ref 22–32)
Calcium: 9.2 mg/dL (ref 8.9–10.3)
Chloride: 106 mmol/L (ref 98–111)
Creatinine, Ser: 1.04 mg/dL (ref 0.61–1.24)
GFR calc Af Amer: >60 mL/min (ref >60)
GFR calc non Af Amer: >60 mL/min (ref >60)
Glucose, Bld: 127 mg/dL — ABNORMAL HIGH (ref 70–99)
Potassium: 3.4 mmol/L — ABNORMAL LOW (ref 3.5–5.1)
Sodium: 139 mmol/L (ref 135–145)
Total Bilirubin: 1 mg/dL (ref 0.3–1.2)
Total Protein: 7.5 g/dL (ref 6.5–8.1)

## 2019-11-10 LAB — CBC
HCT: 41.4 % (ref 39.0–52.0)
Hemoglobin: 14.5 g/dL (ref 13.0–17.0)
MCH: 32 pg (ref 26.0–34.0)
MCHC: 35 g/dL (ref 30.0–36.0)
MCV: 91.4 fL (ref 80.0–100.0)
Platelets: 244 10*3/uL (ref 150–400)
RBC: 4.53 MIL/uL (ref 4.22–5.81)
RDW: 13 % (ref 11.5–15.5)
WBC: 9.4 10*3/uL (ref 4.0–10.5)
nRBC: 0 % (ref 0.0–0.2)

## 2019-11-10 LAB — TROPONIN I (HIGH SENSITIVITY): Troponin I (High Sensitivity): 8 ng/L (ref <18)

## 2019-11-10 LAB — APTT: aPTT: 31 seconds (ref 24–36)

## 2019-11-10 MED ORDER — SODIUM CHLORIDE 0.9% FLUSH: 3.0000 mL | Freq: Once | INTRAVENOUS | Status: DC

## 2019-11-10 NOTE — ED Triage Notes (Signed)
C/O seeing a haze over left eye and periodic "flashes" to left eye.  Started today at noon.  States vision has improved at this time.  BP elevated today.  Patient states he has been suffering from anxiety all week.  AAOx3.  Skin warm and dry. MAE equally and strong.  Facial movements equal.  Speech clear.  NAD

## 2019-11-10 NOTE — ED Notes (Signed)
Pt's daughter requesting Dr or nurse to call her as soon as pt gets roomed, she is concerned he won't tell the Dr the "full story."

## 2019-11-11 ENCOUNTER — Emergency Department: Payer: Medicare Other

## 2019-11-11 ENCOUNTER — Telehealth: Payer: Self-pay

## 2019-11-11 DIAGNOSIS — I10 Essential (primary) hypertension: Secondary | ICD-10-CM | POA: Diagnosis not present

## 2019-11-11 DIAGNOSIS — H43392 Other vitreous opacities, left eye: Secondary | ICD-10-CM | POA: Diagnosis not present

## 2019-11-11 DIAGNOSIS — H4312 Vitreous hemorrhage, left eye: Secondary | ICD-10-CM | POA: Diagnosis not present

## 2019-11-11 DIAGNOSIS — I110 Hypertensive heart disease with heart failure: Secondary | ICD-10-CM | POA: Diagnosis not present

## 2019-11-11 LAB — TROPONIN I (HIGH SENSITIVITY): Troponin I (High Sensitivity): 10 ng/L (ref <18)

## 2019-11-11 LAB — URINALYSIS, COMPLETE (UACMP) WITH MICROSCOPIC
Bacteria, UA: NONE SEEN
Bilirubin Urine: NEGATIVE
Glucose, UA: NEGATIVE mg/dL
Hgb urine dipstick: NEGATIVE
Ketones, ur: 5 mg/dL — AB
Leukocytes,Ua: NEGATIVE
Nitrite: NEGATIVE
Protein, ur: NEGATIVE mg/dL
Specific Gravity, Urine: 1.006 (ref 1.005–1.030)
Squamous Epithelial / HPF: NONE SEEN (ref 0–5)
WBC, UA: NONE SEEN WBC/hpf (ref 0–5)
pH: 7 (ref 5.0–8.0)

## 2019-11-11 MED ORDER — LORAZEPAM 0.5 MG PO TABS: 0.5000 mg | ORAL_TABLET | Freq: Three times a day (TID) | ORAL | 0 refills | Status: DC | PRN

## 2019-11-11 MED ORDER — LORAZEPAM 2 MG/ML IJ SOLN
0.5000 mg | Freq: Once | INTRAMUSCULAR | Status: AC
  Administered 2019-11-11: 0.5 mg via INTRAVENOUS
  Filled 2019-11-11: qty 1

## 2019-11-11 NOTE — Discharge Instructions (Signed)
1.  Continue taking your blood pressure medicines daily as directed by your doctor. 2.  You may take Lorazepam as needed for stress/anxiety. 3.  Return to the ER for worsening symptoms, persistent vomiting, difficulty breathing or other concerns.

## 2019-11-11 NOTE — ED Notes (Signed)
Pt states he had a new change in vision in the RT eye (floaters/blurry) and high BP reading. Pt st he takes amlodipine and enalapril as directed. Denies any CP/SHOB/HA/ injury to the eyes. Pt denies any previous dx of htn but does report an increase in anxiety and stress over the last week.

## 2019-11-11 NOTE — ED Notes (Addendum)
Pt's daughter, Vincente Liberty was called and updated regarding pt's current condition. It was expressed to the daughter that we were still in the process of assessing her father and that he had just been roomed 64mins prior to my call.   Daughter stated that she wanted to "give me the true story of why her father was here tonight" and then proceeded to explain this to this nurse. I ensured the daughter that her father had expressed the same concerns as her own regarding his anxiety r/t his wife passing/ new stressors and that they would be personally relayed to the doctor.   I informed the daughter at 9 that the doctor had been in the room to introduce herself and talk with her father and although she was not during that timeframe , I assured her that the EDP and I did not have enough information at this time to give her a synopsis of what was causing her fathers sx; given that we were in process of additional testing and that I/ or her father would call her in order to listen when the EDP was ready to dx the pt with the reason for his c/c.   Daughter verbalized understanding and accepted this information with no further questions/concerns/ and was appreciative of the clarification. The daughter informed me at the end of the call that she had another call to take. I told the daughter that I would update her when new information became available.

## 2019-11-11 NOTE — Telephone Encounter (Signed)
Per chart review tab pt went to [facility] ED on 11/10/19.Dr Silvio Pate out of office. FYI to Dr Manning Charity CMA and Dr Diona Browner who is in office today.

## 2019-11-11 NOTE — Telephone Encounter (Signed)
[facility] Night - Client TELEPHONE ADVICE RECORD [facility] Patient Name: TARA MALISZEWSKI Gender: Male DOB: 1941/06/26 Age: 78 Y 63 M 3 D Return Phone Number: [phone] (Primary), [phone] (Secondary) Address[place] [address] Client [facility] Night - Client Client Site [place] Physician Viviana Simpler - MD Contact Type Call Who Is Calling Patient / Member / Family / Caregiver Call Type Triage / Clinical Relationship To Patient Self Return Phone Number [phone] (Primary) Chief Complaint VISION - sudden loss or sudden decrease (NOT blurred vision) Reason for Call Symptomatic / Request for [place] states issues with left eye vision today. Black streaks and decreased vision haziness appearing.No other symtoms. Translation No Nurse Assessment Nurse: Alinda Money, RN, Sarah Date/Time Eilene Ghazi Time): 11/10/2019 5:15:25 PM Confirm and document reason for call. If symptomatic, describe symptoms. ---Caller states around 12pm he noticed a "haze" over the left eye. Possibly saw some streaks, black strings in left side eye. States he can still see. No other symptoms. Has the patient had close contact with a person known or suspected to have the novel coronavirus illness OR traveled / lives in area with major community spread (including international travel) in the last 14 days from the onset of symptoms? * If Asymptomatic, screen for exposure and travel within the last 14 days. ---No Does the patient have any new or worsening symptoms? ---Yes Will a triage be completed? ---Yes Related visit to physician within the last 2 weeks? ---No Does the PT have any chronic conditions? (i.e. diabetes, asthma, this includes High risk factors for pregnancy, etc.) ---Yes List chronic conditions. ---HTN, Anxiety, Is this a behavioral health or substance abuse  call? ---No Guidelines Guideline Title Affirmed Question Affirmed Notes Nurse Date/Time (Eastern Time) High Blood Pressure [id number] Systolic BP >= 0000000 OR Diastolic >= [id number] AND [id number] cardiac or neurologic symptoms (e.g., chest pain, difficulty Goins, RN, Judson Roch 11/10/2019 5:21:41 PM PLEASE NOTE: All timestamps contained within this report are represented as [place] Standard Time. CONFIDENTIALTY NOTICE: This fax transmission is intended only for the addressee. It contains information that is legally privileged, confidential or otherwise protected from use or disclosure. If you are not the intended recipient, you are strictly prohibited from reviewing, disclosing, copying using or disseminating any of this information or taking any action in reliance on or regarding this information. If you have received this fax in error, please notify us immediately by telephone so that we can arrange for its return to [place]. Phone: [phone], Toll-Free: [phone], Fax: [phone] Page: 2 of 2 Call Id: [id number] Guidelines Guideline Title Affirmed Question Affirmed Notes Nurse Date/Time Eilene Ghazi Time) breathing, unsteady gait, blurred vision) Disp. Time Eilene Ghazi Time) Disposition Final User 11/10/2019 5:13:25 PM Send to Urgent Queue Scharlene Corn 11/10/2019 5:26:26 PM Go to ED Now Yes Goins, RN, [place] Disagree/Comply Comply Caller Understands Yes PreDisposition Call Doctor Care Advice Given Per Guideline GO TO ED NOW: * You need to be seen in the Emergency Department. CARE ADVICE given per High Blood Pressure (Adult) guideline. Comments User: Sallyanne Kuster, RN Date/Time Eilene Ghazi Time): 11/10/2019 5:21:34 PM BP 135/78 yesterday. BP today 204/107. Enalopril 20mg  in AM, Amlodipine 10mg  HS. Referrals [facility] - ED

## 2019-11-11 NOTE — ED Provider Notes (Signed)
[facility] Emergency Department Provider Note   ____________________________________________   First MD Initiated Contact with Patient 11/11/19 0001     (approximate)  I have reviewed the triage vital signs and the nursing notes.   HISTORY  Chief Complaint Hypertension    HPI Caleb Gray is a 78 y.o. male who presents to the ED from home with a chief complaint of elevated blood pressure and left vision change.  Patient with a history of CAD, hypertension, CHF on enalapril and amlodipine who states he has had several stressors and aggravations this week.  He was particularly aggravated at the post office today and noted the vision in his left eye appeared hazy and he was experiencing periodic "flashes".  He did not experience a shade being pulled over his eyes and he did not experience blindness.  Vision has resolved and is back to baseline at the time of our interview and examination.  Noted his BP was elevated today.  Patient denies associated slurred speech, extremity weakness, dizziness, numbness or tingling.  Did not lose balance or fall.  Denies recent fever, cough, chest pain, shortness of breath, abdominal pain, nausea, vomiting or headaches.       Past Medical History:  Diagnosis Date  . ANEMIA-NOS   . Arthritis    back  . ASTHMA   . Blood transfusion without reported diagnosis    12 years ago when had surgery for kidney mass  . Cataract   . Chronic diastolic heart failure ([place])   . Colon cancer ([place])    [place] 1997 stage III T3, N1 s/p rectosigmoid resection  . CORONARY ARTERY DISEASE   . HYPERLIPIDEMIA   . HYPERTENSION   . Low back pain 10/2009   s/p laminectomy/decompression L4-5  . Myocardial infarction ([place])   . Right kidney mass 02/2007   benign (oncocytoma) s/p resection/total nephrectomy  . UNSPECIFIED PERIPHERAL VASCULAR DISEASE     Patient Active Problem List   Diagnosis Date Noted  . Right-sided low back pain with  sciatica 11/24/2017  . History of colon cancer   . ANEMIA-NOS   . Preventative health care 02/10/2017  . Advance directive discussed with patient 02/10/2017  . Basal cell carcinoma 04/22/2014  . DIVERTICULOSIS-COLON 12/10/2010  . Low back pain 10/11/2009  . Coronary atherosclerosis 08/30/2009  . Mild intermittent asthma without complication 123XX123  . DIVERTICULITIS, HX OF 08/30/2009  . CAD in native artery 08/30/2009  . Essential (primary) hypertension 08/30/2009  . HLD (hyperlipidemia) 08/30/2009  . Airway hyperreactivity 08/30/2009  . Right kidney mass 02/10/2007    Past Surgical History:  Procedure Laterality Date  . COLONOSCOPY    . LUMBAR LAMINECTOMY/DECOMPRESSION MICRODISCECTOMY  10/2009  . NEPHRECTOMY  02/2007   Right  . POLYPECTOMY    . Rectosigmoidectomy  1997    Prior to Admission medications   Medication Sig Start Date End Date Taking? Authorizing Provider  Albuterol Sulfate (PROAIR RESPICLICK) 123XX123 (90 Base) MCG/ACT AEPB Take 2 puffs by mouth 4 (four) times daily as needed. 01/15/19   [name], MD  amLODipine (NORVASC) 10 MG tablet Take 1 tablet (10 mg total) by mouth daily. 08/16/19   [name], MD  aspirin 81 MG tablet Take 81 mg by mouth daily.      [provider]  atorvastatin (LIPITOR) 20 MG tablet Take 1 tablet (20 mg total) by mouth at bedtime. 03/16/19   [name], MD  enalapril (VASOTEC) 20 MG tablet Take 1 tablet (  20 mg total) by mouth daily. 03/16/19   [name], MD  ibuprofen (ADVIL) 800 MG tablet TAKE 1 TABLET (800 MG TOTAL) BY MOUTH 2 (TWO) TIMES DAILY. 10/11/19   [name], MD  LORazepam (ATIVAN) 0.5 MG tablet Take 1 tablet (0.5 mg total) by mouth every 8 (eight) hours as needed for anxiety. 11/11/19   Paulette Blanch, MD  Multiple Vitamins-Minerals (CENTRUM SILVER 50+MEN PO) Take by mouth.    [provider]  Omega-3 Fatty Acids (FISH OIL) 1000 MG CAPS Take by mouth 2 (two) times daily.     [provider]    Allergies Penicillins  Family History  Problem Relation Age of Onset  . Colon cancer Mother   . Heart disease Father   . Alcohol abuse Other        Parents  . Esophageal cancer Neg Hx   . Rectal cancer Neg Hx   . Stomach cancer Neg Hx     Social History Social History   Tobacco Use  . Smoking status: Former Smoker    Types: Cigarettes    Quit date: 11/11/1968    Years since quitting: 51.0  . Smokeless tobacco: Never Used  Substance Use Topics  . Alcohol use: Yes    Alcohol/week: 0.0 standard drinks    Comment: occasionally  . Drug use: Never    Review of Systems  Constitutional: No fever/chills Eyes: Positive for visual changes. ENT: No sore throat. Cardiovascular: Denies chest pain. Respiratory: Denies shortness of breath. Gastrointestinal: No abdominal pain.  No nausea, no vomiting.  No diarrhea.  No constipation. Genitourinary: Negative for dysuria. Musculoskeletal: Negative for back pain. Skin: Negative for rash. Neurological: Negative for headaches, focal weakness or numbness. Psychiatric: Positive for increased stressors and aggravations this week.   ____________________________________________   PHYSICAL EXAM:  VITAL SIGNS: ED Triage Vitals  Enc Vitals Group     BP 11/10/19 1815 (!) 164/102     Pulse Rate 11/10/19 1815 (!) 107     Resp 11/10/19 1815 16     Temp 11/10/19 1815 98.7 F (37.1 C)     Temp Source 11/10/19 1815 Oral     SpO2 11/10/19 1815 99 %     Weight 11/10/19 1816 188 lb 15 oz (85.7 kg)     Height 11/10/19 1816 5\' 10"  (1.778 m)     Head Circumference --      Peak Flow --      Pain Score 11/10/19 1816 0     Pain Loc --      Pain Edu? --      Excl. in [place]? --     Constitutional: Alert and oriented. Well appearing and in no acute distress. Eyes: Conjunctivae are normal. PERRL. EOMI. Head: Atraumatic. Nose: No congestion/rhinnorhea. Mouth/Throat: Mucous membranes are moist.  Oropharynx  non-erythematous. Neck: No stridor.  No carotid bruits. Cardiovascular: Normal rate, regular rhythm. Grossly normal heart sounds.  Good peripheral circulation. Respiratory: Normal respiratory effort.  No retractions. Lungs CTAB. Gastrointestinal: Soft and nontender. No distention. No abdominal bruits. No CVA tenderness. Musculoskeletal: No lower extremity tenderness nor edema.  No joint effusions. Neurologic: Alert and oriented x3.  CN II-XII sling intact.  Normal speech and language. No gross focal neurologic deficits are appreciated. MAEx4. No gait instability. Skin:  Skin is warm, dry and intact. No rash noted. Psychiatric: Mood and affect are normal. Speech and behavior are normal.  ____________________________________________   LABS (all labs ordered are listed, but only  abnormal results are displayed)  Labs Reviewed  DIFFERENTIAL - Abnormal; Notable for the following components:      Result Value   Eosinophils Absolute 0.6 (*)    All other components within normal limits  COMPREHENSIVE METABOLIC PANEL - Abnormal; Notable for the following components:   Potassium 3.4 (*)    Glucose, Bld 127 (*)    ALT 48 (*)    All other components within normal limits  URINALYSIS, COMPLETE (UACMP) WITH MICROSCOPIC - Abnormal; Notable for the following components:   Color, Urine STRAW (*)    APPearance CLEAR (*)    Ketones, ur 5 (*)    All other components within normal limits  PROTIME-INR  APTT  CBC  TROPONIN I (HIGH SENSITIVITY)  TROPONIN I (HIGH SENSITIVITY)   ____________________________________________  EKG  ED ECG REPORT I, , J, the attending physician, personally viewed and interpreted this ECG.   Date: 11/11/2019  EKG Time: 1822  Rate: 101  Rhythm: normal EKG, normal sinus rhythm  Axis: Normal  Intervals:PVCs  ST&T Change: Nonspecific  ____________________________________________  RADIOLOGY  ED MD interpretation: No acute intracranial abnormality on CT head;  no acute cardiopulmonary process  Official radiology report(s): CT HEAD WO CONTRAST  Result Date: 11/10/2019 CLINICAL DATA:  Focal neuro deficit. Left visual disturbances. EXAM: CT HEAD WITHOUT CONTRAST TECHNIQUE: Contiguous axial images were obtained from the base of the skull through the vertex without intravenous contrast. COMPARISON:  None. FINDINGS: Brain: No evidence of acute infarction, hemorrhage, hydrocephalus, extra-axial collection or mass lesion/mass effect. Chronic microvascular ischemic changes are noted. There is a lacunar infarct involving the left basal ganglia. Vascular: No hyperdense vessel or unexpected calcification. Skull: Normal. Negative for fracture or focal lesion. Sinuses/Orbits: Multiple mucosal retention cysts are noted in the bilateral maxillary sinuses. There is mild ethmoid air cell mucosal thickening. The remaining paranasal sinuses and mastoid air cells are essentially clear. Other: None. IMPRESSION: 1. No acute intracranial abnormality. 2. Chronic microvascular ischemic changes and remote left basal ganglia lacunar infarct. 3. Sinus disease as detailed. Electronically Signed   By: Constance Holster M.D.   On: 11/10/2019 19:06   DG Chest Port 1 View  Result Date: 11/11/2019 CLINICAL DATA:  Hypertension. EXAM: PORTABLE CHEST 1 VIEW COMPARISON:  11/02/2012 FINDINGS: The cardiomediastinal contours are normal. Mild chronic peribronchial thickening. Pulmonary vasculature is normal. No consolidation, pleural effusion, or pneumothorax. No acute osseous abnormalities are seen. IMPRESSION: Mild chronic peribronchial thickening. No acute abnormality. Electronically Signed   By: Keith Rake M.D.   On: 11/11/2019 01:28    ____________________________________________   PROCEDURES  Procedure(s) performed (including Critical Care):  Procedures   ____________________________________________   INITIAL IMPRESSION / ASSESSMENT AND PLAN / ED COURSE  As part of my  medical decision making, I reviewed the following data within the [place] History obtained from family, Nursing notes reviewed and incorporated, Labs reviewed, EKG interpreted, Old chart reviewed, Radiograph reviewed and Notes from prior ED visits     Caleb Gray was evaluated in Emergency Department on 11/11/2019 for the symptoms described in the history of present illness. He was evaluated in the context of the global COVID-19 pandemic, which necessitated consideration that the patient might be at risk for infection with the SARS-CoV-2 virus that causes COVID-19. Institutional protocols and algorithms that pertain to the evaluation of patients at risk for COVID-19 are in a state of rapid change based on information released by regulatory bodies including the CDC and federal and state organizations. These  policies and algorithms were followed during the patient's care in the ED.    78 year old male who presents with elevated blood pressure and vision changes in the setting of stress and anxiety surrounding the anniversary of his wife's death.  Differential diagnosis includes but is not limited to CVA, TIA, CAD, hypertension, metabolic, infectious etiologies, etc.  Will obtain repeat timed troponin, chest x-ray urinalysis in addition to labs and imaging already performed.   Clinical Course as of Nov 10 210  Thu Nov 11, 2019  0206 Daughter had called to give more information and feel that patient would benefit from anxiolytic.  He was given low-dose Ativan with good effect.  Blood pressure 133/84.  Repeat troponin has not significantly changed.  UA is unremarkable.  Will discharge home with low-dose Ativan and patient will follow up with his PCP.  Strict return precautions given.  Patient verbalizes understanding and agrees with plan of care.   [JS]    Clinical Course User Index [JS] Paulette Blanch, MD     ____________________________________________   FINAL CLINICAL  IMPRESSION(S) / ED DIAGNOSES  Final diagnoses:  Essential hypertension  Vision changes  Stress  Anxiety     ED Discharge Orders         Ordered    LORazepam (ATIVAN) 0.5 MG tablet  Every 8 hours PRN     11/11/19 0208           Note:  This document was prepared using Dragon voice recognition software and may include unintentional dictation errors.   Paulette Blanch, MD 11/11/19 [phone]

## 2019-11-11 NOTE — Telephone Encounter (Signed)
He was seen and sent home with small quantity of lorazepam. Set up virtual or in person follow up in the next couple of weeks

## 2019-11-11 NOTE — ED Notes (Addendum)
Remaining 1.21ml Ativan was discarded in the sharps container with Halford Decamp, RN acting as witness

## 2019-11-11 NOTE — Telephone Encounter (Signed)
Noted  

## 2019-11-11 NOTE — Telephone Encounter (Signed)
Looks like he is already set up for Hospital F/U on 11-15-19

## 2019-11-15 ENCOUNTER — Encounter: Payer: Self-pay | Admitting: Internal Medicine

## 2019-11-15 ENCOUNTER — Ambulatory Visit (INDEPENDENT_AMBULATORY_CARE_PROVIDER_SITE_OTHER): Payer: Medicare Other | Admitting: Internal Medicine

## 2019-11-15 ENCOUNTER — Other Ambulatory Visit: Payer: Self-pay

## 2019-11-15 DIAGNOSIS — F39 Unspecified mood [affective] disorder: Secondary | ICD-10-CM | POA: Diagnosis not present

## 2019-11-15 DIAGNOSIS — H539 Unspecified visual disturbance: Secondary | ICD-10-CM | POA: Insufficient documentation

## 2019-11-15 DIAGNOSIS — I10 Essential (primary) hypertension: Secondary | ICD-10-CM | POA: Diagnosis not present

## 2019-11-15 MED ORDER — HYDROCHLOROTHIAZIDE 12.5 MG PO CAPS: 12.5000 mg | ORAL_CAPSULE | ORAL | 3 refills | Status: DC

## 2019-11-15 NOTE — Assessment & Plan Note (Signed)
Multiple small events increasing his anxiety Daughter concerned about this (dealing with insurance from [place], package sent to [facility] didn't get there,  etc) Did get lorazepam from the ER doctor---this really helped (took one yesterday and it helped) Notes issues "when things don't go right" Not really feeling depressed--trouble with resilience with wife gone

## 2019-11-15 NOTE — Progress Notes (Signed)
Subjective:    Patient ID: Caleb Gray, male    DOB: [birth year], 79 y.o.   MRN: [id number]  HPI Here for ER follow up  This visit occurred during the SARS-CoV-2 public health emergency.  Safety protocols were in place, including screening questions prior to the visit, additional usage of staff PPE, and extensive cleaning of exam room while observing appropriate contact time as indicated for disinfecting solutions.   Went shopping to [place] noticed left eye vision was "impaired" Haze over the eye Some stress--not sure workers were coming as scheduled (for gutters) Cleaning woman urged him to call doctor He did--and sent to triage He did check BP --- over 200/100, so sent to ER  BP 136/76 and 133/78 When stressed ----was 177/77  Current Outpatient Medications on File Prior to Visit  Medication Sig Dispense Refill  . Albuterol Sulfate (PROAIR RESPICLICK) 123XX123 (90 Base) MCG/ACT AEPB Take 2 puffs by mouth 4 (four) times daily as needed. 3 each 3  . amLODipine (NORVASC) 10 MG tablet Take 1 tablet (10 mg total) by mouth daily. 90 tablet 3  . aspirin 81 MG tablet Take 81 mg by mouth daily.      Marland Kitchen atorvastatin (LIPITOR) 20 MG tablet Take 1 tablet (20 mg total) by mouth at bedtime. 90 tablet 3  . enalapril (VASOTEC) 20 MG tablet Take 1 tablet (20 mg total) by mouth daily. 90 tablet 3  . ibuprofen (ADVIL) 800 MG tablet TAKE 1 TABLET (800 MG TOTAL) BY MOUTH 2 (TWO) TIMES DAILY. 60 tablet 1  . LORazepam (ATIVAN) 0.5 MG tablet Take 1 tablet (0.5 mg total) by mouth every 8 (eight) hours as needed for anxiety. 15 tablet 0  . Multiple Vitamins-Minerals (CENTRUM SILVER 50+MEN PO) Take by mouth.    . Omega-3 Fatty Acids (FISH OIL) 1000 MG CAPS Take by mouth 2 (two) times daily.      No current facility-administered medications on file prior to visit.    Allergies  Allergen Reactions  . Penicillins     Very dry and itchy skin, make throat "feel funny:    Past Medical History:   Diagnosis Date  . ANEMIA-NOS   . Arthritis    back  . ASTHMA   . Blood transfusion without reported diagnosis    12 years ago when had surgery for kidney mass  . Cataract   . Chronic diastolic heart failure ([place])   . Colon cancer ([place])    [place] 1997 stage III T3, N1 s/p rectosigmoid resection  . CORONARY ARTERY DISEASE   . HYPERLIPIDEMIA   . HYPERTENSION   . Low back pain 10/2009   s/p laminectomy/decompression L4-5  . Myocardial infarction ([place])   . Right kidney mass 02/2007   benign (oncocytoma) s/p resection/total nephrectomy  . UNSPECIFIED PERIPHERAL VASCULAR DISEASE     Past Surgical History:  Procedure Laterality Date  . COLONOSCOPY    . LUMBAR LAMINECTOMY/DECOMPRESSION MICRODISCECTOMY  10/2009  . NEPHRECTOMY  02/2007   Right  . POLYPECTOMY    . Rectosigmoidectomy  1997    Family History  Problem Relation Age of Onset  . Colon cancer Mother   . Heart disease Father   . Alcohol abuse Other        Parents  . Esophageal cancer Neg Hx   . Rectal cancer Neg Hx   . Stomach cancer Neg Hx     Social History   Socioeconomic History  . Marital status: Widowed    Spouse name:  Not on file  . Number of children: 2  . Years of education: Not on file  . Highest education level: Not on file  Occupational History  . Occupation: IT--systems     Comment: retired  Tobacco Use  . Smoking status: Former Smoker    Types: Cigarettes    Quit date: 11/11/1968    Years since quitting: 51.0  . Smokeless tobacco: Never Used  Substance and Sexual Activity  . Alcohol use: Yes    Alcohol/week: 0.0 standard drinks    Comment: occasionally  . Drug use: Never  . Sexual activity: Not on file  Other Topics Concern  . Not on file  Social History Narrative   Moved from [place]   Widowed 2/20      Has living will    Daughter is health care POA   Would accept resuscitation   No prolonged machines or feeding tube   Social Determinants of Health   Financial Resource  Strain:   . Difficulty of Paying Living Expenses: Not on file  Food Insecurity:   . Worried About Charity fundraiser in the Last Year: Not on file  . Ran Out of Food in the Last Year: Not on file  Transportation Needs:   . Lack of Transportation (Medical): Not on file  . Lack of Transportation (Non-Medical): Not on file  Physical Activity:   . Days of Exercise per Week: Not on file  . Minutes of Exercise per Session: Not on file  Stress:   . Feeling of Stress : Not on file  Social Connections:   . Frequency of Communication with Friends and Family: Not on file  . Frequency of Social Gatherings with Friends and Family: Not on file  . Attends Religious Services: Not on file  . Active Member of Clubs or Organizations: Not on file  . Attends Archivist Meetings: Not on file  . Marital Status: Not on file  Intimate Partner Violence:   . Fear of Current or Ex-Partner: Not on file  . Emotionally Abused: Not on file  . Physically Abused: Not on file  . Sexually Abused: Not on file   Review of Systems No chest pain No SOB Eating okay Sleeping fine    Objective:   Physical Exam  Constitutional: He appears well-developed. No distress.  Neck: No thyromegaly present.  Cardiovascular: Normal rate, regular rhythm and normal heart sounds. Exam reveals no gallop.  No murmur heard. Respiratory: Effort normal and breath sounds normal. No respiratory distress. He has no wheezes. He has no rales.  Musculoskeletal:        General: No edema.  Lymphadenopathy:    He has no cervical adenopathy.  Psychiatric: He has a normal mood and affect. His behavior is normal.           Assessment & Plan:

## 2019-11-15 NOTE — Assessment & Plan Note (Signed)
Left eye findings last week Reviewed ER records---no acute stroke on CT scan (but microvascular disease seen and old lacunar infarct) Also seen by ophthalmologist--did note small hemorrhage in blood vessel Felt to be related to his BP Not consistent with amaurosis fugax

## 2019-11-15 NOTE — Assessment & Plan Note (Signed)
BP Readings from Last 3 Encounters:  11/15/19 (!) 164/62  11/11/19 (!) 163/95  06/03/19 140/86   Repeat 156/90 Will add HCTZ due to concerns about the effect on his eyes

## 2019-12-13 ENCOUNTER — Ambulatory Visit (INDEPENDENT_AMBULATORY_CARE_PROVIDER_SITE_OTHER): Payer: Medicare Other | Admitting: Internal Medicine

## 2019-12-13 ENCOUNTER — Encounter: Payer: Self-pay | Admitting: Internal Medicine

## 2019-12-13 ENCOUNTER — Other Ambulatory Visit: Payer: Self-pay

## 2019-12-13 VITALS — BP 130/72 | HR 92 | Temp 97.4°F | Ht 70.0 in | Wt 191.4 lb

## 2019-12-13 DIAGNOSIS — I10 Essential (primary) hypertension: Secondary | ICD-10-CM | POA: Diagnosis not present

## 2019-12-13 DIAGNOSIS — F39 Unspecified mood [affective] disorder: Secondary | ICD-10-CM

## 2019-12-13 NOTE — Assessment & Plan Note (Signed)
BP Readings from Last 3 Encounters:  12/13/19 130/72  11/15/19 (!) 164/62  11/11/19 (!) 163/95   Much better now on the HCTZ Will continue this and the other medications Check renal function

## 2019-12-13 NOTE — Assessment & Plan Note (Signed)
Doing a little better No complicated grieving

## 2019-12-13 NOTE — Progress Notes (Signed)
Subjective:    Patient ID: Caleb Gray, male    DOB: 1940-12-19, 79 y.o.   MRN: [id number]  HPI Here for follow up of HTN and mood issues This visit occurred during the SARS-CoV-2 public health emergency.  Safety protocols were in place, including screening questions prior to the visit, additional usage of staff PPE, and extensive cleaning of exam room while observing appropriate contact time as indicated for disinfecting solutions.   Has done well with the new medication---HCTZ No fatigue No orthostatic dizziness He checks BP almost daily---- 120-130's/70's No chest pain or SOB  Mood is some better Felt the anxiety was related to when his BP spiked Still with aggravations (like couldn't register wife's car--they wouldn't put him on the lease) Still grieving--but working through process  Current Outpatient Medications on File Prior to Visit  Medication Sig Dispense Refill  . Albuterol Sulfate (PROAIR RESPICLICK) [id number] (90 Base) MCG/ACT AEPB Take 2 puffs by mouth 4 (four) times daily as needed. 3 each 3  . amLODipine (NORVASC) 10 MG tablet Take 1 tablet (10 mg total) by mouth daily. 90 tablet 3  . aspirin 81 MG tablet Take 81 mg by mouth daily.      Marland Kitchen atorvastatin (LIPITOR) 20 MG tablet Take 1 tablet (20 mg total) by mouth at bedtime. 90 tablet 3  . enalapril (VASOTEC) 20 MG tablet Take 1 tablet (20 mg total) by mouth daily. 90 tablet 3  . hydrochlorothiazide (MICROZIDE) 12.5 MG capsule Take 1 capsule (12.5 mg total) by mouth every morning. 90 capsule 3  . ibuprofen (ADVIL) 800 MG tablet TAKE 1 TABLET (800 MG TOTAL) BY MOUTH 2 (TWO) TIMES DAILY. 60 tablet 1  . LORazepam (ATIVAN) 0.5 MG tablet Take 1 tablet (0.5 mg total) by mouth every 8 (eight) hours as needed for anxiety. 15 tablet 0  . Multiple Vitamins-Minerals (CENTRUM SILVER 50+MEN PO) Take by mouth.    . Omega-3 Fatty Acids (FISH OIL) 1000 MG CAPS Take by mouth 2 (two) times daily.      No current facility-administered  medications on file prior to visit.    Allergies  Allergen Reactions  . Penicillins     Very dry and itchy skin, make throat "feel funny:    Past Medical History:  Diagnosis Date  . ANEMIA-NOS   . Arthritis    back  . ASTHMA   . Blood transfusion without reported diagnosis    12 years ago when had surgery for kidney mass  . Cataract   . Chronic diastolic heart failure ([place])   . Colon cancer ([place])    [place] 1997 stage III T3, N1 s/p rectosigmoid resection  . CORONARY ARTERY DISEASE   . HYPERLIPIDEMIA   . HYPERTENSION   . Low back pain 10/2009   s/p laminectomy/decompression L4-5  . Myocardial infarction ([place])   . Right kidney mass 02/2007   benign (oncocytoma) s/p resection/total nephrectomy  . UNSPECIFIED PERIPHERAL VASCULAR DISEASE     Past Surgical History:  Procedure Laterality Date  . COLONOSCOPY    . LUMBAR LAMINECTOMY/DECOMPRESSION MICRODISCECTOMY  10/2009  . NEPHRECTOMY  02/2007   Right  . POLYPECTOMY    . Rectosigmoidectomy  1997    Family History  Problem Relation Age of Onset  . Colon cancer Mother   . Heart disease Father   . Alcohol abuse Other        Parents  . Esophageal cancer Neg Hx   . Rectal cancer Neg Hx   . Stomach  cancer Neg Hx     Social History   Socioeconomic History  . Marital status: Widowed    Spouse name: Not on file  . Number of children: 2  . Years of education: Not on file  . Highest education level: Not on file  Occupational History  . Occupation: IT--systems     Comment: retired  Tobacco Use  . Smoking status: Former Smoker    Types: Cigarettes    Quit date: 11/11/1968    Years since quitting: 51.1  . Smokeless tobacco: Never Used  Substance and Sexual Activity  . Alcohol use: Yes    Alcohol/week: 0.0 standard drinks    Comment: occasionally  . Drug use: Never  . Sexual activity: Not on file  Other Topics Concern  . Not on file  Social History Narrative   Moved from [place]   Widowed 2/20      Has  living will    Daughter is health care POA   Would accept resuscitation   No prolonged machines or feeding tube   Social Determinants of Health   Financial Resource Strain:   . Difficulty of Paying Living Expenses: Not on file  Food Insecurity:   . Worried About Charity fundraiser in the Last Year: Not on file  . Ran Out of Food in the Last Year: Not on file  Transportation Needs:   . Lack of Transportation (Medical): Not on file  . Lack of Transportation (Non-Medical): Not on file  Physical Activity:   . Days of Exercise per Week: Not on file  . Minutes of Exercise per Session: Not on file  Stress:   . Feeling of Stress : Not on file  Social Connections:   . Frequency of Communication with Friends and Family: Not on file  . Frequency of Social Gatherings with Friends and Family: Not on file  . Attends Religious Services: Not on file  . Active Member of Clubs or Organizations: Not on file  . Attends Archivist Meetings: Not on file  . Marital Status: Not on file  Intimate Partner Violence:   . Fear of Current or Ex-Partner: Not on file  . Emotionally Abused: Not on file  . Physically Abused: Not on file  . Sexually Abused: Not on file   Review of Systems  Sleeps well Appetite is fine     Objective:   Physical Exam  Constitutional: He appears well-developed. No distress.  Neck: No thyromegaly present.  Cardiovascular: Normal rate, regular rhythm and normal heart sounds. Exam reveals no gallop.  No murmur heard. Respiratory: Effort normal and breath sounds normal. No respiratory distress. He has no wheezes. He has no rales.  Musculoskeletal:        General: No edema.  Lymphadenopathy:    He has no cervical adenopathy.  Psychiatric: He has a normal mood and affect. His behavior is normal.           Assessment & Plan:

## 2019-12-14 LAB — RENAL FUNCTION PANEL
Albumin: 4.2 g/dL (ref 3.5–5.2)
BUN: 24 mg/dL — ABNORMAL HIGH (ref 6–23)
CO2: 30 mEq/L (ref 19–32)
Calcium: 9.6 mg/dL (ref 8.4–10.5)
Chloride: 102 mEq/L (ref 96–112)
Creatinine, Ser: 1.48 mg/dL (ref 0.40–1.50)
GFR: 45.92 mL/min — ABNORMAL LOW (ref >60.00)
Glucose, Bld: 114 mg/dL — ABNORMAL HIGH (ref 70–99)
Phosphorus: 3.1 mg/dL (ref 2.3–4.6)
Potassium: 3.7 mEq/L (ref 3.5–5.1)
Sodium: 140 mEq/L (ref 135–145)

## 2019-12-19 ENCOUNTER — Other Ambulatory Visit: Payer: Self-pay | Admitting: Internal Medicine

## 2019-12-24 ENCOUNTER — Ambulatory Visit: Payer: PRIVATE HEALTH INSURANCE

## 2019-12-28 ENCOUNTER — Ambulatory Visit: Payer: PRIVATE HEALTH INSURANCE

## 2019-12-31 ENCOUNTER — Ambulatory Visit: Payer: PRIVATE HEALTH INSURANCE

## 2020-01-02 ENCOUNTER — Ambulatory Visit: Payer: Medicare Other | Attending: Internal Medicine

## 2020-01-02 DIAGNOSIS — Z23 Encounter for immunization: Secondary | ICD-10-CM | POA: Insufficient documentation

## 2020-01-02 NOTE — Progress Notes (Signed)
   Covid-19 Vaccination Clinic  Name:  Caleb Gray    MRN: [id number] DOB: January 03, 1941  01/02/2020  [name] was observed post Covid-19 immunization for 15 minutes without incidence. He was provided with Vaccine Information Sheet and instruction to access the V-Safe system.   [name] was instructed to call 911 with any severe reactions post vaccine: Marland Kitchen Difficulty breathing  . Swelling of your face and throat  . A fast heartbeat  . A bad rash all over your body  . Dizziness and weakness    Immunizations Administered    Name Date Dose VIS Date Route   Pfizer COVID-19 Vaccine 01/02/2020  8:56 AM 0.3 mL 10/22/2019 Intramuscular   Manufacturer: [place]   Lot: [id number]   [place]: [id number]

## 2020-01-03 DIAGNOSIS — H43392 Other vitreous opacities, left eye: Secondary | ICD-10-CM | POA: Diagnosis not present

## 2020-01-03 DIAGNOSIS — H02834 Dermatochalasis of left upper eyelid: Secondary | ICD-10-CM | POA: Diagnosis not present

## 2020-01-03 DIAGNOSIS — H02831 Dermatochalasis of right upper eyelid: Secondary | ICD-10-CM | POA: Diagnosis not present

## 2020-01-03 DIAGNOSIS — H26493 Other secondary cataract, bilateral: Secondary | ICD-10-CM | POA: Diagnosis not present

## 2020-01-25 ENCOUNTER — Ambulatory Visit: Payer: Medicare Other | Attending: Internal Medicine

## 2020-01-25 DIAGNOSIS — Z23 Encounter for immunization: Secondary | ICD-10-CM

## 2020-01-25 NOTE — Progress Notes (Signed)
   Covid-19 Vaccination Clinic  Name:  OLLIS WEMHOFF    MRN: [id number] DOB: [birth year]  01/25/2020  Mr. Deeter was observed post Covid-19 immunization for 15 minutes without incident. He was provided with Vaccine Information Sheet and instruction to access the V-Safe system.   Mr. Melchiorre was instructed to call 911 with any severe reactions post vaccine: Marland Kitchen Difficulty breathing  . Swelling of face and throat  . A fast heartbeat  . A bad rash all over body  . Dizziness and weakness   Immunizations Administered    Name Date Dose VIS Date Route   Pfizer COVID-19 Vaccine 01/25/2020  3:16 PM 0.3 mL 10/22/2019 Intramuscular   Manufacturer: [place]   Lot: [id number]   [place]: [id number]

## 2020-02-08 DIAGNOSIS — Z1283 Encounter for screening for malignant neoplasm of skin: Secondary | ICD-10-CM | POA: Diagnosis not present

## 2020-02-08 DIAGNOSIS — I781 Nevus, non-neoplastic: Secondary | ICD-10-CM | POA: Diagnosis not present

## 2020-02-08 DIAGNOSIS — D2260 Melanocytic nevi of unspecified upper limb, including shoulder: Secondary | ICD-10-CM | POA: Diagnosis not present

## 2020-02-08 DIAGNOSIS — L820 Inflamed seborrheic keratosis: Secondary | ICD-10-CM | POA: Diagnosis not present

## 2020-02-08 DIAGNOSIS — D225 Melanocytic nevi of trunk: Secondary | ICD-10-CM | POA: Diagnosis not present

## 2020-02-08 DIAGNOSIS — D2339 Other benign neoplasm of skin of other parts of face: Secondary | ICD-10-CM | POA: Diagnosis not present

## 2020-02-08 DIAGNOSIS — D1801 Hemangioma of skin and subcutaneous tissue: Secondary | ICD-10-CM | POA: Diagnosis not present

## 2020-02-08 DIAGNOSIS — D2270 Melanocytic nevi of unspecified lower limb, including hip: Secondary | ICD-10-CM | POA: Diagnosis not present

## 2020-02-08 DIAGNOSIS — L578 Other skin changes due to chronic exposure to nonionizing radiation: Secondary | ICD-10-CM | POA: Diagnosis not present

## 2020-02-08 DIAGNOSIS — C44319 Basal cell carcinoma of skin of other parts of face: Secondary | ICD-10-CM | POA: Diagnosis not present

## 2020-02-08 DIAGNOSIS — L821 Other seborrheic keratosis: Secondary | ICD-10-CM | POA: Diagnosis not present

## 2020-02-08 DIAGNOSIS — L570 Actinic keratosis: Secondary | ICD-10-CM | POA: Diagnosis not present

## 2020-02-08 DIAGNOSIS — D485 Neoplasm of uncertain behavior of skin: Secondary | ICD-10-CM | POA: Diagnosis not present

## 2020-02-08 DIAGNOSIS — Z85828 Personal history of other malignant neoplasm of skin: Secondary | ICD-10-CM | POA: Diagnosis not present

## 2020-02-08 DIAGNOSIS — L568 Other specified acute skin changes due to ultraviolet radiation: Secondary | ICD-10-CM | POA: Diagnosis not present

## 2020-02-08 DIAGNOSIS — L814 Other melanin hyperpigmentation: Secondary | ICD-10-CM | POA: Diagnosis not present

## 2020-03-04 ENCOUNTER — Other Ambulatory Visit: Payer: Self-pay | Admitting: Internal Medicine

## 2020-03-20 DIAGNOSIS — C44311 Basal cell carcinoma of skin of nose: Secondary | ICD-10-CM | POA: Diagnosis not present

## 2020-04-01 ENCOUNTER — Other Ambulatory Visit: Payer: Self-pay | Admitting: Internal Medicine

## 2020-05-23 DIAGNOSIS — H02413 Mechanical ptosis of bilateral eyelids: Secondary | ICD-10-CM | POA: Diagnosis not present

## 2020-05-23 DIAGNOSIS — H02834 Dermatochalasis of left upper eyelid: Secondary | ICD-10-CM | POA: Diagnosis not present

## 2020-05-23 DIAGNOSIS — H02132 Senile ectropion of right lower eyelid: Secondary | ICD-10-CM | POA: Diagnosis not present

## 2020-05-23 DIAGNOSIS — H02831 Dermatochalasis of right upper eyelid: Secondary | ICD-10-CM | POA: Diagnosis not present

## 2020-05-23 DIAGNOSIS — H02135 Senile ectropion of left lower eyelid: Secondary | ICD-10-CM | POA: Diagnosis not present

## 2020-06-06 ENCOUNTER — Encounter: Payer: Self-pay | Admitting: Internal Medicine

## 2020-06-06 ENCOUNTER — Other Ambulatory Visit: Payer: Self-pay

## 2020-06-06 ENCOUNTER — Ambulatory Visit (INDEPENDENT_AMBULATORY_CARE_PROVIDER_SITE_OTHER): Payer: Medicare Other | Admitting: Internal Medicine

## 2020-06-06 VITALS — BP 132/70 | HR 83 | Temp 97.7°F | Ht 69.0 in | Wt 188.0 lb

## 2020-06-06 DIAGNOSIS — G8929 Other chronic pain: Secondary | ICD-10-CM | POA: Diagnosis not present

## 2020-06-06 DIAGNOSIS — Z0000 Encounter for general adult medical examination without abnormal findings: Secondary | ICD-10-CM | POA: Diagnosis not present

## 2020-06-06 DIAGNOSIS — F39 Unspecified mood [affective] disorder: Secondary | ICD-10-CM | POA: Diagnosis not present

## 2020-06-06 DIAGNOSIS — I2510 Atherosclerotic heart disease of native coronary artery without angina pectoris: Secondary | ICD-10-CM

## 2020-06-06 DIAGNOSIS — Z7189 Other specified counseling: Secondary | ICD-10-CM

## 2020-06-06 DIAGNOSIS — J4520 Mild intermittent asthma, uncomplicated: Secondary | ICD-10-CM

## 2020-06-06 DIAGNOSIS — M5441 Lumbago with sciatica, right side: Secondary | ICD-10-CM | POA: Diagnosis not present

## 2020-06-06 DIAGNOSIS — I10 Essential (primary) hypertension: Secondary | ICD-10-CM

## 2020-06-06 LAB — COMPREHENSIVE METABOLIC PANEL
ALT: 29 U/L (ref 0–53)
AST: 25 U/L (ref 0–37)
Albumin: 4.1 g/dL (ref 3.5–5.2)
Alkaline Phosphatase: 86 U/L (ref 39–117)
BUN: 19 mg/dL (ref 6–23)
CO2: 30 mEq/L (ref 19–32)
Calcium: 10 mg/dL (ref 8.4–10.5)
Chloride: 102 mEq/L (ref 96–112)
Creatinine, Ser: 1.46 mg/dL (ref 0.40–1.50)
GFR: 46.59 mL/min — ABNORMAL LOW (ref >60.00)
Glucose, Bld: 130 mg/dL — ABNORMAL HIGH (ref 70–99)
Potassium: 3.8 mEq/L (ref 3.5–5.1)
Sodium: 138 mEq/L (ref 135–145)
Total Bilirubin: 0.9 mg/dL (ref 0.2–1.2)
Total Protein: 6.4 g/dL (ref 6.0–8.3)

## 2020-06-06 LAB — LIPID PANEL
Cholesterol: 112 mg/dL (ref 0–200)
HDL: 43.7 mg/dL (ref >39.00)
LDL Cholesterol: 54 mg/dL (ref 0–99)
NonHDL: 68.09
Total CHOL/HDL Ratio: 3
Triglycerides: 70 mg/dL (ref 0.0–149.0)
VLDL: 14 mg/dL (ref 0.0–40.0)

## 2020-06-06 LAB — CBC
HCT: 40.9 % (ref 39.0–52.0)
Hemoglobin: 14.4 g/dL (ref 13.0–17.0)
MCHC: 35.2 g/dL (ref 30.0–36.0)
MCV: 94.2 fl (ref 78.0–100.0)
Platelets: 245 10*3/uL (ref 150.0–400.0)
RBC: 4.34 Mil/uL (ref 4.22–5.81)
RDW: 13.4 % (ref 11.5–15.5)
WBC: 6.9 10*3/uL (ref 4.0–10.5)

## 2020-06-06 NOTE — Assessment & Plan Note (Signed)
Discussed changing albuterol to prn only

## 2020-06-06 NOTE — Assessment & Plan Note (Signed)
Only occasional down times Mostly over the grieving No medications needed Has lorazepam left over--but really doesn't use

## 2020-06-06 NOTE — Progress Notes (Signed)
Subjective:    Patient ID: Caleb Gray, male    DOB: 1941/05/20, 79 y.o.   MRN: [id number]  HPI Here for Medicare wellness visit and follow up of chronic health conditions This visit occurred during the SARS-CoV-2 public health emergency.  Safety protocols were in place, including screening questions prior to the visit, additional usage of staff PPE, and extensive cleaning of exam room while observing appropriate contact time as indicated for disinfecting solutions.   Reviewed form and advanced directives Reviewed other doctors Occasional beer No tobacco Not really exercising--but has started golfing again Vision is fine Hearing is so-so--hearing aides help some No falls  Independent with instrumental ADLs No memory problems  Doing okay in general Still grieves some---but mostly has "come to grips with it" No persistent depression  Having some back problems Herniated disc 10 years ago--had surgery Never back to normal Now notices some right hip pain and decreased used of right leg (less agility) Slight pain and weakness Has cut back on ibuprofen  No chest pain Monitors BP--- 110/67--123/76 No palpitations No dizziness or syncope No edema No SOB No cough or wheezing---is using the albuterol regularly (discussed using prn)  Current Outpatient Medications on File Prior to Visit  Medication Sig Dispense Refill  . Albuterol Sulfate (PROAIR RESPICLICK) 621 (90 Base) MCG/ACT AEPB Take 2 puffs by mouth 4 (four) times daily as needed. 3 each 3  . amLODipine (NORVASC) 10 MG tablet Take 1 tablet (10 mg total) by mouth daily. 90 tablet 3  . aspirin 81 MG tablet Take 81 mg by mouth daily.      Marland Kitchen atorvastatin (LIPITOR) 20 MG tablet TAKE 1 TABLET AT BEDTIME 90 tablet 3  . enalapril (VASOTEC) 20 MG tablet TAKE 1 TABLET DAILY 90 tablet 0  . hydrochlorothiazide (MICROZIDE) 12.5 MG capsule Take 1 capsule (12.5 mg total) by mouth every morning. 90 capsule 3  . ibuprofen (ADVIL) 800  MG tablet TAKE 1 TABLET (800 MG TOTAL) BY MOUTH 2 (TWO) TIMES DAILY. 60 tablet 1  . Multiple Vitamins-Minerals (CENTRUM SILVER 50+MEN PO) Take by mouth.    . Omega-3 Fatty Acids (FISH OIL) 1000 MG CAPS Take by mouth 2 (two) times daily.      No current facility-administered medications on file prior to visit.    Allergies  Allergen Reactions  . Penicillins     Very dry and itchy skin, make throat "feel funny:    Past Medical History:  Diagnosis Date  . ANEMIA-NOS   . Arthritis    back  . ASTHMA   . Blood transfusion without reported diagnosis    12 years ago when had surgery for kidney mass  . Cataract   . Chronic diastolic heart failure ([place])   . Colon cancer ([place])    [place] 1997 stage III T3, N1 s/p rectosigmoid resection  . CORONARY ARTERY DISEASE   . HYPERLIPIDEMIA   . HYPERTENSION   . Low back pain 10/2009   s/p laminectomy/decompression L4-5  . Myocardial infarction ([place])   . Right kidney mass 02/2007   benign (oncocytoma) s/p resection/total nephrectomy  . UNSPECIFIED PERIPHERAL VASCULAR DISEASE     Past Surgical History:  Procedure Laterality Date  . COLONOSCOPY    . LUMBAR LAMINECTOMY/DECOMPRESSION MICRODISCECTOMY  10/2009  . NEPHRECTOMY  02/2007   Right  . POLYPECTOMY    . Rectosigmoidectomy  1997    Family History  Problem Relation Age of Onset  . Colon cancer Mother   . Heart disease  Father   . Alcohol abuse Other        Parents  . Esophageal cancer Neg Hx   . Rectal cancer Neg Hx   . Stomach cancer Neg Hx     Social History   Socioeconomic History  . Marital status: Widowed    Spouse name: Not on file  . Number of children: 2  . Years of education: Not on file  . Highest education level: Not on file  Occupational History  . Occupation: IT--systems     Comment: retired  Tobacco Use  . Smoking status: Former Smoker    Types: Cigarettes    Quit date: 11/11/1968    Years since quitting: 51.6  . Smokeless tobacco: Never Used  Vaping Use    . Vaping Use: Never used  Substance and Sexual Activity  . Alcohol use: Yes    Alcohol/week: 0.0 standard drinks    Comment: occasionally  . Drug use: Never  . Sexual activity: Not on file  Other Topics Concern  . Not on file  Social History Narrative   Moved from [place]   Widowed 2/20      Has living will    Daughter is health care POA   Would accept resuscitation   No prolonged machines or feeding tube   Social Determinants of Health   Financial Resource Strain:   . Difficulty of Paying Living Expenses:   Food Insecurity:   . Worried About Charity fundraiser in the Last Year:   . Arboriculturist in the Last Year:   Transportation Needs:   . Film/video editor (Medical):   Marland Kitchen Lack of Transportation (Non-Medical):   Physical Activity:   . Days of Exercise per Week:   . Minutes of Exercise per Session:   Stress:   . Feeling of Stress :   Social Connections:   . Frequency of Communication with Friends and Family:   . Frequency of Social Gatherings with Friends and Family:   . Attends Religious Services:   . Active Member of Clubs or Organizations:   . Attends Archivist Meetings:   Marland Kitchen Marital Status:   Intimate Partner Violence:   . Fear of Current or Ex-Partner:   . Emotionally Abused:   Marland Kitchen Physically Abused:   . Sexually Abused:    Review of Systems Did have Moh's surgery for [facility] Scheduled for eyelid surgery later this year Appetite is good Weight is fairly stable Sleeps okay Wears seat belt Teeth okay--due for dentist Occasional heartburn-no meds. No dysphagia Bowels are fine--no blood Voids okay---nocturia x 1-2 only    Objective:   Physical Exam Constitutional:      Appearance: Normal appearance.  HENT:     Head: Normocephalic and atraumatic.     Mouth/Throat:     Mouth: Mucous membranes are moist.     Comments: Upper plate No lesions Eyes:     Conjunctiva/sclera: Conjunctivae normal.     Pupils: Pupils are equal, round,  and reactive to light.  Cardiovascular:     Rate and Rhythm: Normal rate and regular rhythm.     Pulses: Normal pulses.     Heart sounds: No murmur heard.  No gallop.   Pulmonary:     Effort: Pulmonary effort is normal.     Breath sounds: Normal breath sounds. No wheezing or rales.  Abdominal:     Palpations: Abdomen is soft.     Tenderness: There is no abdominal tenderness.  Musculoskeletal:  Cervical back: Neck supple.     Right lower leg: No edema.     Left lower leg: No edema.  Lymphadenopathy:     Cervical: No cervical adenopathy.  Skin:    Findings: No rash.     Comments: No lesions  Neurological:     Mental Status: He is alert and oriented to person, place, and time.     Comments: President--- "Waymond Cera, Obama" [phone] [id number] Recall 3/3  Psychiatric:        Mood and Affect: Mood normal.        Behavior: Behavior normal.            Assessment & Plan:

## 2020-06-06 NOTE — Assessment & Plan Note (Signed)
BP Readings from Last 3 Encounters:  06/06/20 (!) 132/70  12/13/19 130/72  11/15/19 (!) 164/62   Doing okay with enalapril and HCTZ

## 2020-06-06 NOTE — Assessment & Plan Note (Signed)
No angina On statin, asa ACEI

## 2020-06-06 NOTE — Assessment & Plan Note (Signed)
I have personally reviewed the Medicare Annual Wellness questionnaire and have noted 1. The patient's medical and social history 2. Their use of alcohol, tobacco or illicit drugs 3. Their current medications and supplements 4. The patient's functional ability including ADL's, fall risks, home safety risks and hearing or visual             impairment. 5. Diet and physical activities 6. Evidence for depression or mood disorders  The patients weight, height, BMI and visual acuity have been recorded in the chart I have made referrals, counseling and provided education to the patient based review of the above and I have provided the pt with a written personalized care plan for preventive services.  I have provided you with a copy of your personalized plan for preventive services. Please take the time to review along with your updated medication list.  May have one last colonoscopy in 1-2 years No PSA due to age Discussed exercise Flu vaccine in fall Td if any injury Consider shingrix at pharmacy

## 2020-06-06 NOTE — Assessment & Plan Note (Signed)
Mild symptoms Discussed fitness first Consider evaluation with Dr Lorelei Pont or physiatrist

## 2020-06-06 NOTE — Assessment & Plan Note (Signed)
See social history 

## 2020-06-07 ENCOUNTER — Ambulatory Visit (INDEPENDENT_AMBULATORY_CARE_PROVIDER_SITE_OTHER): Payer: Medicare Other | Admitting: Cardiology

## 2020-06-07 ENCOUNTER — Encounter: Payer: Self-pay | Admitting: Cardiology

## 2020-06-07 VITALS — BP 120/60 | HR 95 | Ht 69.0 in | Wt 190.0 lb

## 2020-06-07 DIAGNOSIS — I2510 Atherosclerotic heart disease of native coronary artery without angina pectoris: Secondary | ICD-10-CM | POA: Diagnosis not present

## 2020-06-07 DIAGNOSIS — E782 Mixed hyperlipidemia: Secondary | ICD-10-CM | POA: Diagnosis not present

## 2020-06-07 DIAGNOSIS — I10 Essential (primary) hypertension: Secondary | ICD-10-CM | POA: Diagnosis not present

## 2020-06-07 NOTE — Patient Instructions (Signed)
Medication Instructions:  The current medical regimen is effective;  continue present plan and medications.  *If you need a refill on your cardiac medications before your next appointment, please call your pharmacy*  Follow-Up: At [facility], you and your health needs are our priority.  As part of our continuing mission to provide you with exceptional heart care, we have created designated Provider Care Teams.  These Care Teams include your primary Cardiologist (physician) and Advanced Practice Providers (APPs -  Physician Assistants and Nurse Practitioners) who all work together to provide you with the care you need, when you need it.  Your next appointment:   12 month(s)  The format for your next appointment:   In Person  Provider:   Mark Skains, MD   Thank you for choosing [facility]!!     

## 2020-06-07 NOTE — Progress Notes (Signed)
Cardiology Office Note:    Date:  06/07/2020   ID:  Caleb Gray, DOB 10/06/1941, MRN [id number]  PCP:  [name], MD  [facility] Cardiologist:  Candee Furbish, MD  [facility] Electrophysiologist:  None   Referring MD: [name], MD     History of Present Illness:    Caleb Gray is a 79 y.o. male  with coronary artery disease, medical management for cardiac catheterization demonstrated 70% mid LAD, 90% distal RCAgiven lack of symptoms. Low risk Myoview in February 04, 2008. Walks golfs. Echocardiogram with normal ejection fraction, normal RV. Complaint of low back pain has been chronic.  He also has a history of colon cancer, hypertension, right nephrectomy in 2007-02-04.  Wife died pancreatic cancer for 5 years. In 2//26/2020 died. Hiatal hernia. Surgery 17 days in hospital. 10 days later at home died. Acute resp syndrome. He was wondering if could have been associated with COVID-19.  Had some left hearing loss, possible neuritis, took prednisone.  Went to ER last year with HTN floaters. Doing well now.  Trying to walk more.  See below for details.  Past Medical History:  Diagnosis Date   ANEMIA-NOS    Arthritis    back   ASTHMA    Blood transfusion without reported diagnosis    12 years ago when had surgery for kidney mass   Cataract    Chronic diastolic heart failure ([place])    Colon cancer ([place])    Aden[place] 1997 stage III T3, N1 s/p rectosigmoid resection   CORONARY ARTERY DISEASE    HYPERLIPIDEMIA    HYPERTENSION    Low back pain 10/2009   s/p laminectomy/decompression L4-5   Myocardial infarction [facility])    Right kidney mass 02/2007   benign (oncocytoma) s/p resection/total nephrectomy   UNSPECIFIED PERIPHERAL VASCULAR DISEASE     Past Surgical History:  Procedure Laterality Date   COLONOSCOPY     LUMBAR LAMINECTOMY/DECOMPRESSION MICRODISCECTOMY  10/2009   NEPHRECTOMY  02/2007   Right   POLYPECTOMY     Rectosigmoidectomy  02/04/96      Current Medications: Current Meds  Medication Sig   Albuterol Sulfate (PROAIR RESPICLICK) 660 (90 Base) MCG/ACT AEPB Take 2 puffs by mouth 4 (four) times daily as needed.   amLODipine (NORVASC) 10 MG tablet Take 1 tablet (10 mg total) by mouth daily.   aspirin 81 MG tablet Take 81 mg by mouth daily.     atorvastatin (LIPITOR) 20 MG tablet TAKE 1 TABLET AT BEDTIME   enalapril (VASOTEC) 20 MG tablet TAKE 1 TABLET DAILY   hydrochlorothiazide (MICROZIDE) 12.5 MG capsule Take 1 capsule (12.5 mg total) by mouth every morning.   ibuprofen (ADVIL) 800 MG tablet TAKE 1 TABLET (800 MG TOTAL) BY MOUTH 2 (TWO) TIMES DAILY.   Multiple Vitamins-Minerals (CENTRUM SILVER 50+MEN PO) Take by mouth.   Omega-3 Fatty Acids (FISH OIL) 1000 MG CAPS Take by mouth 2 (two) times daily.      Allergies:   Penicillins   Social History   Socioeconomic History   Marital status: Widowed    Spouse name: Not on file   Number of children: 2   Years of education: Not on file   Highest education level: Not on file  Occupational History   Occupation: IT--systems     Comment: retired  Tobacco Use   Smoking status: Former Smoker    Types: Cigarettes    Quit date: 11/11/1968    Years since quitting: 51.6   Smokeless  tobacco: Never Used  Vaping Use   Vaping Use: Never used  Substance and Sexual Activity   Alcohol use: Yes    Alcohol/week: 0.0 standard drinks    Comment: occasionally   Drug use: Never   Sexual activity: Not on file  Other Topics Concern   Not on file  Social History Narrative   Moved from [place] 2/20      Has living will    Daughter is health care POA   Would accept resuscitation   No prolonged machines or feeding tube   Social Determinants of Health   Financial Resource Strain:    Difficulty of Paying Living Expenses:   Food Insecurity:    Worried About Charity fundraiser in the Last Year:    Arboriculturist in the Last Year:    Transportation Needs:    Film/video editor (Medical):    Lack of Transportation (Non-Medical):   Physical Activity:    Days of Exercise per Week:    Minutes of Exercise per Session:   Stress:    Feeling of Stress :   Social Connections:    Frequency of Communication with Friends and Family:    Frequency of Social Gatherings with Friends and Family:    Attends Religious Services:    Active Member of Clubs or Organizations:    Attends Archivist Meetings:    Marital Status:      Family History: The patient's family history includes Alcohol abuse in an other family member; Colon cancer in his mother; Heart disease in his father. There is no history of Esophageal cancer, Rectal cancer, or Stomach cancer.  ROS:   Please see the history of present illness.     All other systems reviewed and are negative.  EKGs/Labs/Other Studies Reviewed:    The following studies were reviewed today: ECHO 06/2014 -EF 60-65%, mild LVH, normal RV size and systolic function.    ETT-myoview 8/09 - fixed deficit at basal inferior segment (small), low risk.   Cath 02/2007 in [place]: - 70% mid LAD, 90% dist RCA managed medically    Recent Labs: 06/06/2020: ALT 29; BUN 19; Creatinine, Ser 1.46; Hemoglobin 14.4; Platelets 245.0; Potassium 3.8; Sodium 138  Recent Lipid Panel    Component Value Date/Time   CHOL 112 06/06/2020 1109   TRIG 70.0 06/06/2020 1109   TRIG 79 03/08/2009 0000   HDL 43.70 06/06/2020 1109   CHOLHDL 3 06/06/2020 1109   VLDL 14.0 06/06/2020 1109   LDLCALC 54 06/06/2020 1109   LDLCALC 62 03/08/2009 0000   LDLDIRECT 159.7 11/28/2009 0918    Physical Exam:    VS:  BP (!) 120/60    Pulse 95    Ht 5\' 9"  (1.753 m)    Wt 190 lb (86.2 kg)    SpO2 97%    BMI 28.06 kg/m     Wt Readings from Last 3 Encounters:  06/07/20 190 lb (86.2 kg)  06/06/20 188 lb (85.3 kg)  12/13/19 191 lb 6.4 oz (86.8 kg)     GEN:  Well nourished, well developed in  no acute distress HEENT: Normal NECK: No JVD; No carotid bruits LYMPHATICS: No lymphadenopathy CARDIAC: RRR, no murmurs, rubs, gallops RESPIRATORY:  Clear to auscultation without rales, wheezing or rhonchi  ABDOMEN: Soft, non-tender, non-distended MUSCULOSKELETAL:  No edema; No deformity  SKIN: Warm and dry NEUROLOGIC:  Alert and oriented x 3 PSYCHIATRIC:  Normal affect   ASSESSMENT:  1. Atherosclerosis of native coronary artery of native heart without angina pectoris   2. Essential hypertension   3. Mixed hyperlipidemia    PLAN:    In order of problems listed above:  HYPERLIPIDEMIA LDL was at goal (<70),LDL 52 2019. Excellent. He has been taking his atorvastatin for 15 years. Doing quite well with it. No changes made. No myalgias  CORONARY ARTERY DISEASE  Stable with no ischemic symptoms. Preserved EF on 8/15 echo. Continue ASA, statin, enalapril.See catheterization above. Described his coronary plaques. No angina. Continue with secondary prevention , goal-directed therapy, no changes  HYPERTENSION  BP is controlled.His blood pressure cuff at home usually reads between 120/80. Sometimes it can very rarely go to 145. Overall stable on amlodipine and ACE-I.   Sometimes may have some minimal lower extremity edema from amlodipine.  BACK PAIN Has been an issue.  He discussed this with Dr. Silvio Pate, he will be doing more core exercises leg exercises.    Medication Adjustments/Labs and Tests Ordered: Current medicines are reviewed at length with the patient today.  Concerns regarding medicines are outlined above.  No orders of the defined types were placed in this encounter.  No orders of the defined types were placed in this encounter.   Patient Instructions  Medication Instructions:  The current medical regimen is effective;  continue present plan and medications.  *If you need a refill on your cardiac medications before your next appointment, please call your  pharmacy*  Follow-Up: At [facility], you and your health needs are our priority.  As part of our continuing mission to provide you with exceptional heart care, we have created designated Provider Care Teams.  These Care Teams include your primary Cardiologist (physician) and Advanced Practice Providers (APPs -  Physician Assistants and Nurse Practitioners) who all work together to provide you with the care you need, when you need it.  Your next appointment:   12 month(s)  The format for your next appointment:   In Person  Provider:   Candee Furbish, MD  Thank you for choosing [facility]!!        Signed, Candee Furbish, MD  06/07/2020 12:03 PM    [place]

## 2020-06-13 ENCOUNTER — Other Ambulatory Visit: Payer: Self-pay | Admitting: Internal Medicine

## 2020-07-20 DIAGNOSIS — C44311 Basal cell carcinoma of skin of nose: Secondary | ICD-10-CM | POA: Diagnosis not present

## 2020-07-24 DIAGNOSIS — H02835 Dermatochalasis of left lower eyelid: Secondary | ICD-10-CM | POA: Diagnosis not present

## 2020-07-24 DIAGNOSIS — Z87891 Personal history of nicotine dependence: Secondary | ICD-10-CM | POA: Diagnosis not present

## 2020-07-24 DIAGNOSIS — H02132 Senile ectropion of right lower eyelid: Secondary | ICD-10-CM | POA: Diagnosis not present

## 2020-07-24 DIAGNOSIS — I252 Old myocardial infarction: Secondary | ICD-10-CM | POA: Diagnosis not present

## 2020-07-24 DIAGNOSIS — H02834 Dermatochalasis of left upper eyelid: Secondary | ICD-10-CM | POA: Diagnosis not present

## 2020-07-24 DIAGNOSIS — I2510 Atherosclerotic heart disease of native coronary artery without angina pectoris: Secondary | ICD-10-CM | POA: Diagnosis not present

## 2020-07-24 DIAGNOSIS — Z7982 Long term (current) use of aspirin: Secondary | ICD-10-CM | POA: Diagnosis not present

## 2020-07-24 DIAGNOSIS — H02831 Dermatochalasis of right upper eyelid: Secondary | ICD-10-CM | POA: Diagnosis not present

## 2020-07-24 DIAGNOSIS — H02413 Mechanical ptosis of bilateral eyelids: Secondary | ICD-10-CM | POA: Diagnosis not present

## 2020-07-24 DIAGNOSIS — I10 Essential (primary) hypertension: Secondary | ICD-10-CM | POA: Diagnosis not present

## 2020-07-24 DIAGNOSIS — Z880 Allergy status to penicillin: Secondary | ICD-10-CM | POA: Diagnosis not present

## 2020-07-24 DIAGNOSIS — Z79899 Other long term (current) drug therapy: Secondary | ICD-10-CM | POA: Diagnosis not present

## 2020-07-24 DIAGNOSIS — H02135 Senile ectropion of left lower eyelid: Secondary | ICD-10-CM | POA: Diagnosis not present

## 2020-07-25 ENCOUNTER — Other Ambulatory Visit: Payer: Self-pay | Admitting: Internal Medicine

## 2020-08-10 DIAGNOSIS — D2270 Melanocytic nevi of unspecified lower limb, including hip: Secondary | ICD-10-CM | POA: Diagnosis not present

## 2020-08-10 DIAGNOSIS — L820 Inflamed seborrheic keratosis: Secondary | ICD-10-CM | POA: Diagnosis not present

## 2020-08-10 DIAGNOSIS — D2260 Melanocytic nevi of unspecified upper limb, including shoulder: Secondary | ICD-10-CM | POA: Diagnosis not present

## 2020-08-10 DIAGNOSIS — D225 Melanocytic nevi of trunk: Secondary | ICD-10-CM | POA: Diagnosis not present

## 2020-08-10 DIAGNOSIS — Z1283 Encounter for screening for malignant neoplasm of skin: Secondary | ICD-10-CM | POA: Diagnosis not present

## 2020-08-10 DIAGNOSIS — D1801 Hemangioma of skin and subcutaneous tissue: Secondary | ICD-10-CM | POA: Diagnosis not present

## 2020-08-10 DIAGNOSIS — L814 Other melanin hyperpigmentation: Secondary | ICD-10-CM | POA: Diagnosis not present

## 2020-08-10 DIAGNOSIS — L821 Other seborrheic keratosis: Secondary | ICD-10-CM | POA: Diagnosis not present

## 2020-08-10 DIAGNOSIS — Z85828 Personal history of other malignant neoplasm of skin: Secondary | ICD-10-CM | POA: Diagnosis not present

## 2020-08-10 DIAGNOSIS — L578 Other skin changes due to chronic exposure to nonionizing radiation: Secondary | ICD-10-CM | POA: Diagnosis not present

## 2020-08-21 ENCOUNTER — Ambulatory Visit: Payer: Medicare Other | Attending: Internal Medicine

## 2020-08-21 DIAGNOSIS — Z23 Encounter for immunization: Secondary | ICD-10-CM

## 2020-08-21 NOTE — Progress Notes (Signed)
   Covid-19 Vaccination Clinic  Name:  Caleb Gray    MRN: [id number] DOB: 1941-04-07  08/21/2020  Caleb Gray was observed post Covid-19 immunization for 30 minutes based on pre-vaccination screening without incident. He was provided with Vaccine Information Sheet and instruction to access the V-Safe system.   Caleb Gray was instructed to call 911 with any severe reactions post vaccine: Marland Kitchen Difficulty breathing  . Swelling of face and throat  . A fast heartbeat  . A bad rash all over body  . Dizziness and weakness

## 2020-09-07 DIAGNOSIS — Z23 Encounter for immunization: Secondary | ICD-10-CM | POA: Diagnosis not present

## 2020-09-12 ENCOUNTER — Telehealth: Payer: Self-pay | Admitting: Internal Medicine

## 2020-09-12 NOTE — Telephone Encounter (Signed)
Pt stated he called pharmacy to get refill on proair [facility] told him that dr needs to get prior autho before they can refill  Prior autho  [phone]

## 2020-09-13 NOTE — Telephone Encounter (Signed)
Left message for pt. Usually when a PA is required for Proair, they want him to be on Ventolin or Proventil. I asked if he had access to his formulary and could let me know which one so I could change it.

## 2020-09-16 MED ORDER — ALBUTEROL SULFATE HFA 108 (90 BASE) MCG/ACT IN AERS: 2.0000 | INHALATION_SPRAY | RESPIRATORY_TRACT | 3 refills | Status: DC | PRN

## 2020-09-16 NOTE — Telephone Encounter (Signed)
Pt states they will fill albuterol HFA without PA. Our system does not have plain albuterol HFA not connected to another name. I sent it in with a note stating to send as the generic albuterol. Hopefully that works.

## 2020-10-14 IMAGING — CT CT HEAD W/O CM
4 series · 16 of 47 positions shown, 18 images · non-contrast
Comparison: None.

CLINICAL DATA: Focal neuro deficit. Left visual disturbances.

EXAM:
CT HEAD WITHOUT CONTRAST
TECHNIQUE: Contiguous axial images were obtained from the base of the skull
through the vertex without intravenous contrast.

[Series 2: head wo · axial · 0.45mm/px · z∈[-156,-36]mm · 7 of 33 slices shown, 9 images]
[im 5/33  brain]
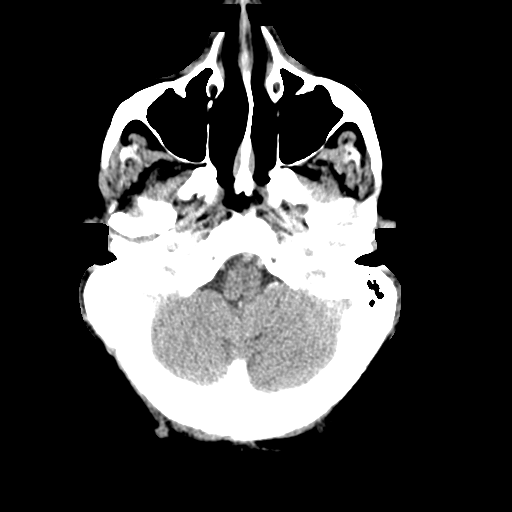
[im 5/33  bone]
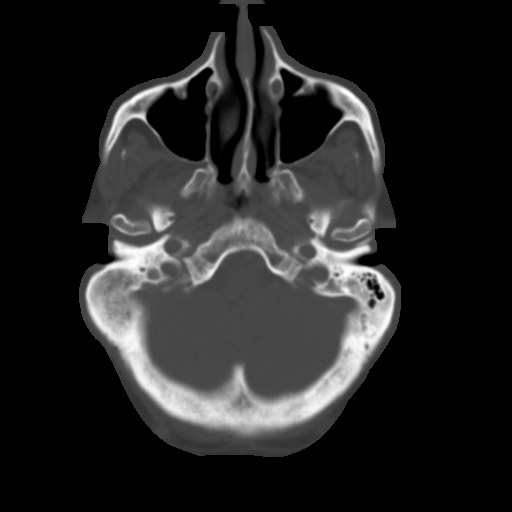
[im 9/33  brain]
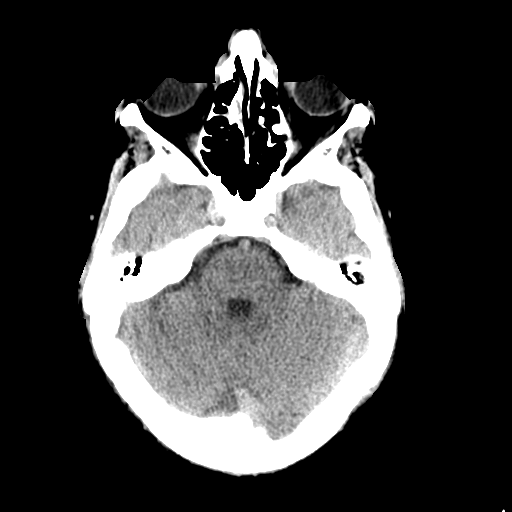
[im 13/33  brain]
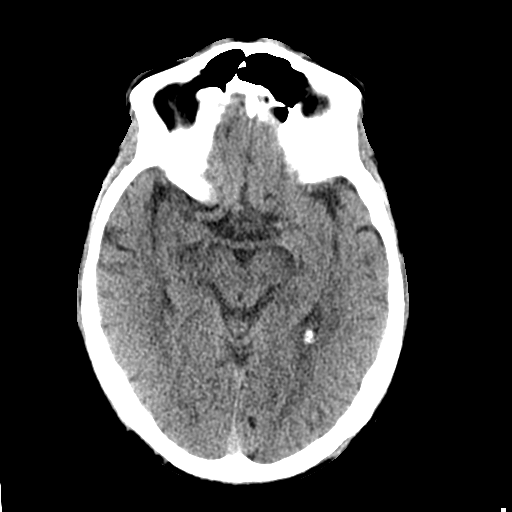
[im 17/33  brain]
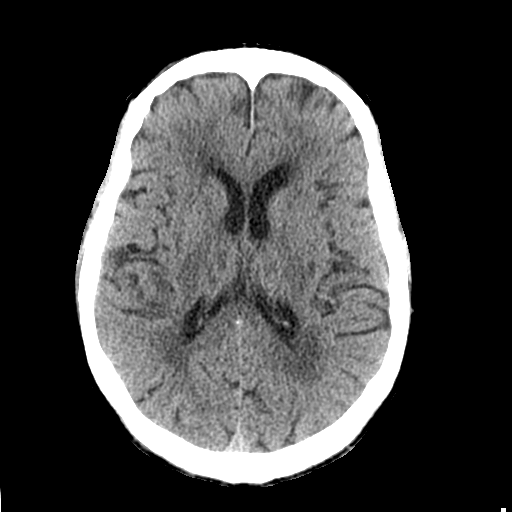
[im 21/33  brain]
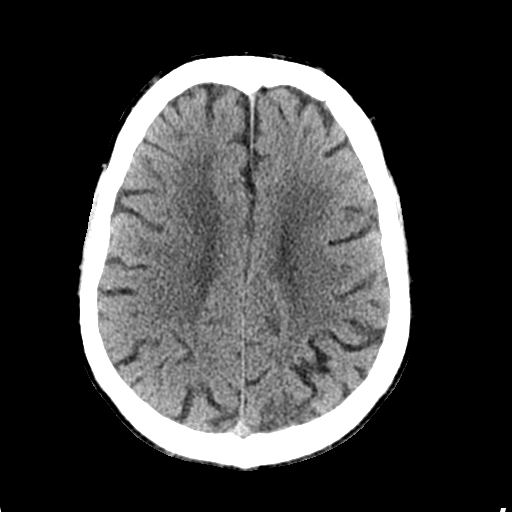
[im 21/33  bone]
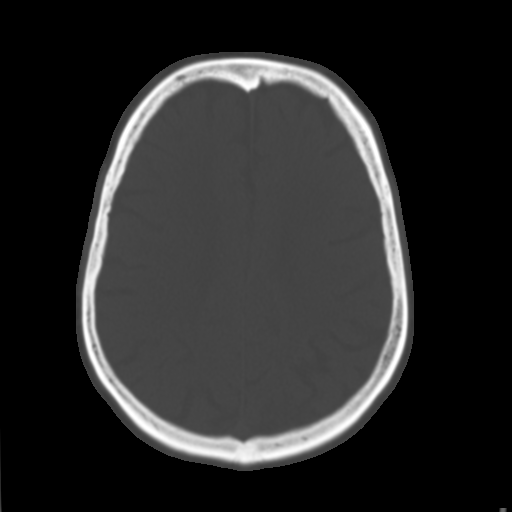
[im 25/33  brain]
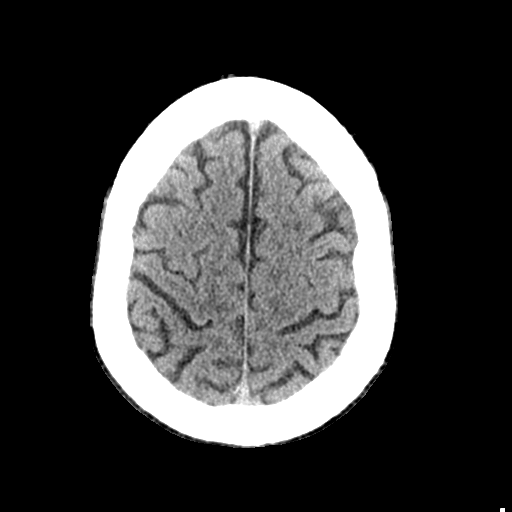
[im 29/33  brain]
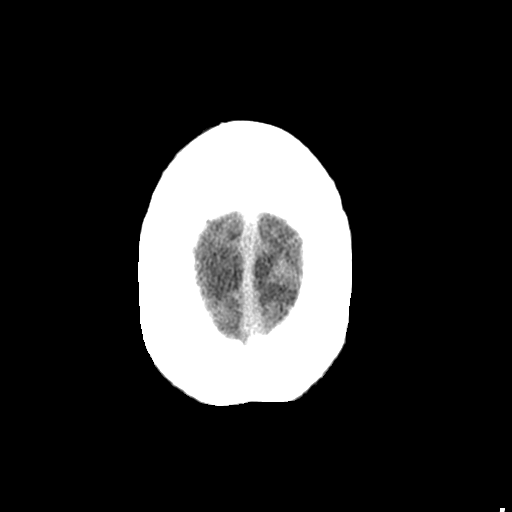

[Series 3: head bone · axial · 0.45mm/px · z∈[-160,-128]mm · 3 of 82 slices shown]
[im 9/82  bone]
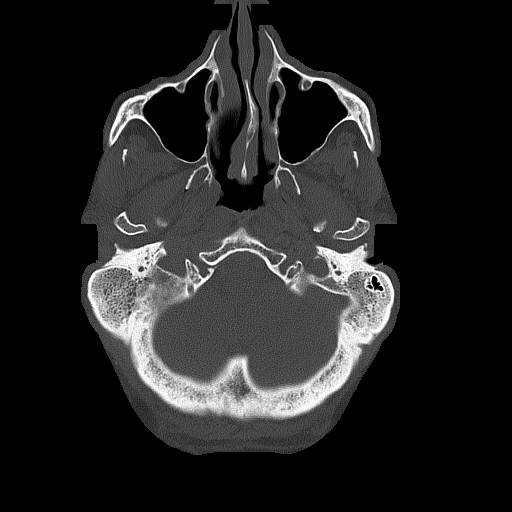
[im 17/82  bone]
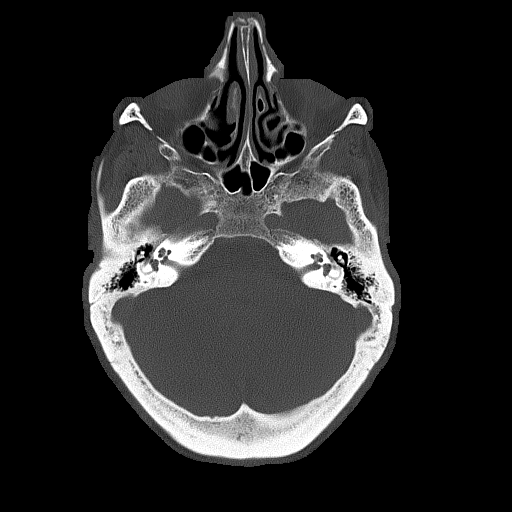
[im 25/82  bone]
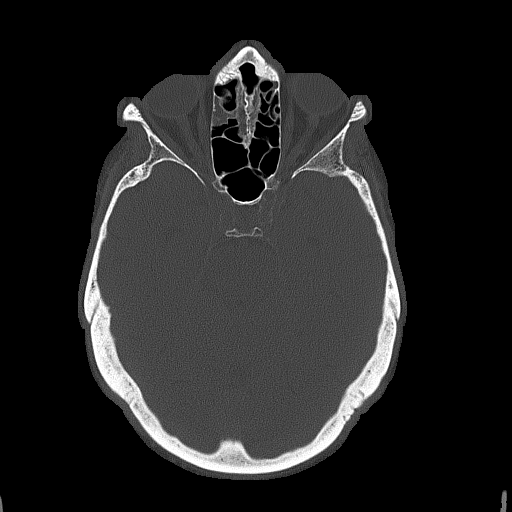

[Series 4: coronal soft tissue · coronal · 0.33mm/px · 3 of 71 slices shown]
[im 24/71  brain]
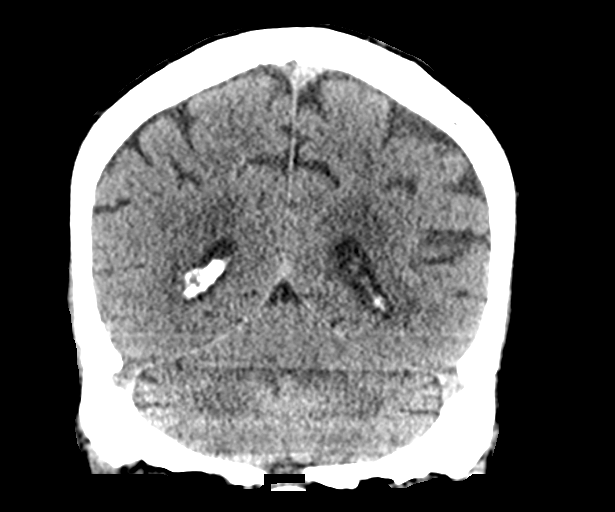
[im 32/71  brain]
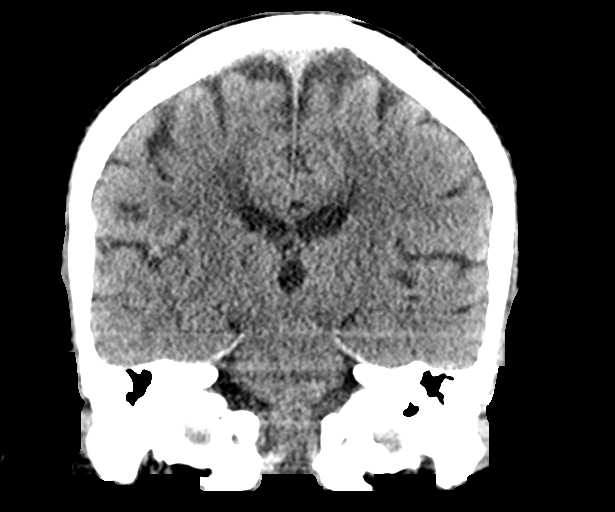
[im 39/71  brain]
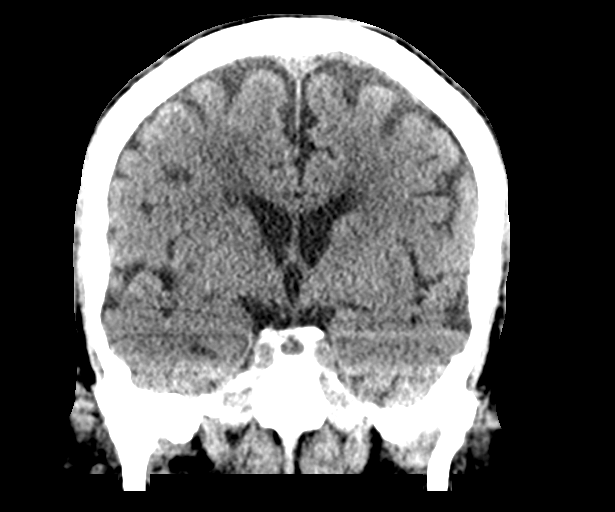

[Series 5: sagittal soft tissue · sagittal · 0.33mm/px · 3 of 55 slices shown]
[im 19/55  brain]
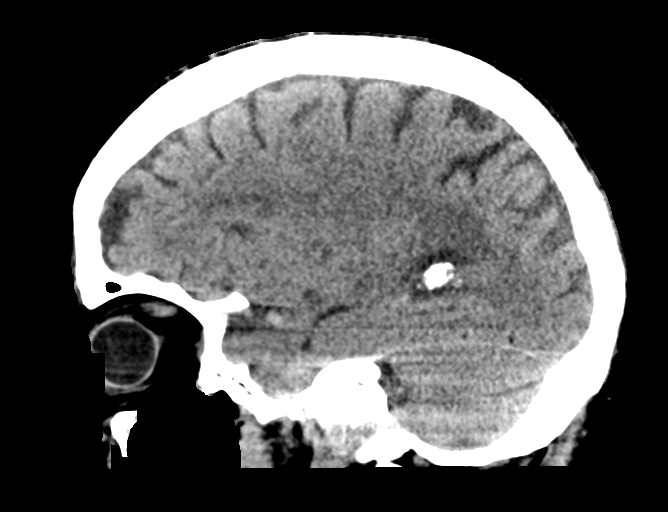
[im 28/55  brain]
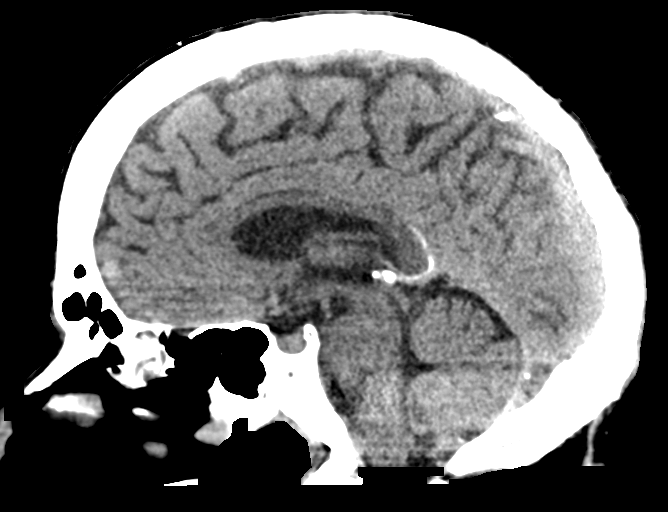
[im 37/55  brain]
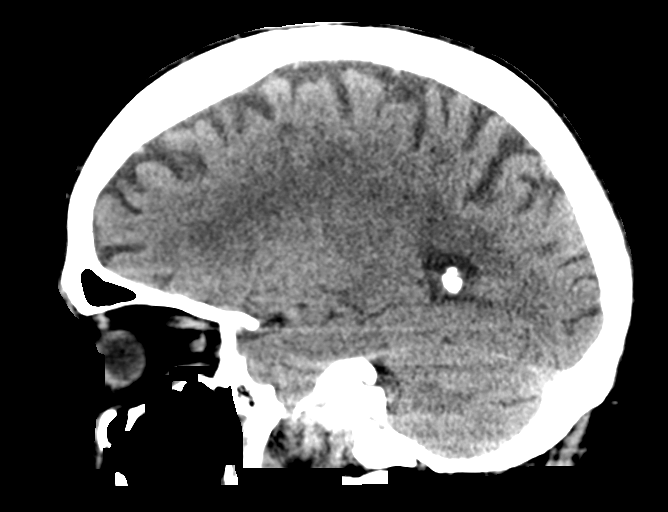

[16 of 47 positions shown; findings below may reference images not displayed]

FINDINGS: Brain: No evidence of acute infarction, hemorrhage, hydrocephalus,
extra-axial collection or mass lesion/mass effect. Chronic
microvascular ischemic changes are noted. There is a lacunar infarct
involving the left basal ganglia.

Vascular: No hyperdense vessel or unexpected calcification.

Skull: Normal. Negative for fracture or focal lesion.

Sinuses/Orbits: Multiple mucosal retention cysts are noted in the
bilateral maxillary sinuses. There is mild ethmoid air cell mucosal
thickening. The remaining paranasal sinuses and mastoid air cells
are essentially clear.

Other: None.
IMPRESSION: 1. No acute intracranial abnormality.
2. Chronic microvascular ischemic changes and remote left basal
ganglia lacunar infarct.
3. Sinus disease as detailed.

## 2020-10-18 ENCOUNTER — Other Ambulatory Visit: Payer: Self-pay | Admitting: Internal Medicine

## 2020-10-20 ENCOUNTER — Telehealth: Payer: Self-pay

## 2020-10-20 NOTE — Telephone Encounter (Signed)
I would just recommend tylenol and if needed robitussin DM (or equivalent for cough). Unlikely COVID with his vaccines, but possible----so he should stay quarantined till he feels better

## 2020-10-20 NOTE — Telephone Encounter (Signed)
LMTCO.

## 2020-10-20 NOTE — Telephone Encounter (Signed)
Spoke to pt. He is starting to feel a little better today. He may get tested for Covid.

## 2020-12-12 DIAGNOSIS — H02831 Dermatochalasis of right upper eyelid: Secondary | ICD-10-CM | POA: Diagnosis not present

## 2020-12-12 DIAGNOSIS — H02834 Dermatochalasis of left upper eyelid: Secondary | ICD-10-CM | POA: Diagnosis not present

## 2020-12-12 DIAGNOSIS — H02413 Mechanical ptosis of bilateral eyelids: Secondary | ICD-10-CM | POA: Diagnosis not present

## 2021-01-08 DIAGNOSIS — H26493 Other secondary cataract, bilateral: Secondary | ICD-10-CM | POA: Diagnosis not present

## 2021-01-25 DIAGNOSIS — C44311 Basal cell carcinoma of skin of nose: Secondary | ICD-10-CM | POA: Diagnosis not present

## 2021-01-29 ENCOUNTER — Other Ambulatory Visit: Payer: Self-pay | Admitting: Internal Medicine

## 2021-02-15 DIAGNOSIS — D1801 Hemangioma of skin and subcutaneous tissue: Secondary | ICD-10-CM | POA: Diagnosis not present

## 2021-02-15 DIAGNOSIS — D2260 Melanocytic nevi of unspecified upper limb, including shoulder: Secondary | ICD-10-CM | POA: Diagnosis not present

## 2021-02-15 DIAGNOSIS — L821 Other seborrheic keratosis: Secondary | ICD-10-CM | POA: Diagnosis not present

## 2021-02-15 DIAGNOSIS — Z85828 Personal history of other malignant neoplasm of skin: Secondary | ICD-10-CM | POA: Diagnosis not present

## 2021-02-15 DIAGNOSIS — L814 Other melanin hyperpigmentation: Secondary | ICD-10-CM | POA: Diagnosis not present

## 2021-02-15 DIAGNOSIS — D2270 Melanocytic nevi of unspecified lower limb, including hip: Secondary | ICD-10-CM | POA: Diagnosis not present

## 2021-02-15 DIAGNOSIS — D225 Melanocytic nevi of trunk: Secondary | ICD-10-CM | POA: Diagnosis not present

## 2021-02-15 DIAGNOSIS — Z1283 Encounter for screening for malignant neoplasm of skin: Secondary | ICD-10-CM | POA: Diagnosis not present

## 2021-02-15 DIAGNOSIS — L578 Other skin changes due to chronic exposure to nonionizing radiation: Secondary | ICD-10-CM | POA: Diagnosis not present

## 2021-03-26 ENCOUNTER — Telehealth: Payer: Self-pay

## 2021-03-26 MED ORDER — HYDROCHLOROTHIAZIDE 12.5 MG PO CAPS
ORAL_CAPSULE | ORAL | 3 refills | Status: DC
Start: 2021-03-26 — End: 2021-09-19

## 2021-03-26 NOTE — Telephone Encounter (Signed)
Called pt to make sure he wanted his HCTZ to [facility]. We had received a fax from them. He does wan it to go there. Rx sent electronically.

## 2021-05-31 ENCOUNTER — Other Ambulatory Visit: Payer: Self-pay | Admitting: Internal Medicine

## 2021-06-12 ENCOUNTER — Encounter: Payer: Self-pay | Admitting: Internal Medicine

## 2021-06-12 ENCOUNTER — Other Ambulatory Visit: Payer: Self-pay

## 2021-06-12 ENCOUNTER — Ambulatory Visit (INDEPENDENT_AMBULATORY_CARE_PROVIDER_SITE_OTHER): Payer: Medicare Other | Admitting: Internal Medicine

## 2021-06-12 VITALS — BP 134/72 | HR 96 | Temp 97.9°F | Ht 68.0 in | Wt 187.0 lb

## 2021-06-12 DIAGNOSIS — J4520 Mild intermittent asthma, uncomplicated: Secondary | ICD-10-CM | POA: Diagnosis not present

## 2021-06-12 DIAGNOSIS — N1831 Chronic kidney disease, stage 3a: Secondary | ICD-10-CM

## 2021-06-12 DIAGNOSIS — I10 Essential (primary) hypertension: Secondary | ICD-10-CM | POA: Diagnosis not present

## 2021-06-12 DIAGNOSIS — Z7189 Other specified counseling: Secondary | ICD-10-CM | POA: Diagnosis not present

## 2021-06-12 DIAGNOSIS — F39 Unspecified mood [affective] disorder: Secondary | ICD-10-CM

## 2021-06-12 DIAGNOSIS — I2510 Atherosclerotic heart disease of native coronary artery without angina pectoris: Secondary | ICD-10-CM

## 2021-06-12 DIAGNOSIS — Z0000 Encounter for general adult medical examination without abnormal findings: Secondary | ICD-10-CM

## 2021-06-12 DIAGNOSIS — N1832 Chronic kidney disease, stage 3b: Secondary | ICD-10-CM | POA: Insufficient documentation

## 2021-06-12 LAB — HEPATIC FUNCTION PANEL
ALT: 32 U/L (ref 0–53)
AST: 28 U/L (ref 0–37)
Albumin: 4.2 g/dL (ref 3.5–5.2)
Alkaline Phosphatase: 90 U/L (ref 39–117)
Bilirubin, Direct: 0.2 mg/dL (ref 0.0–0.3)
Total Bilirubin: 1.1 mg/dL (ref 0.2–1.2)
Total Protein: 7 g/dL (ref 6.0–8.3)

## 2021-06-12 LAB — RENAL FUNCTION PANEL
Albumin: 4.2 g/dL (ref 3.5–5.2)
BUN: 20 mg/dL (ref 6–23)
CO2: 27 mEq/L (ref 19–32)
Calcium: 9.8 mg/dL (ref 8.4–10.5)
Chloride: 101 mEq/L (ref 96–112)
Creatinine, Ser: 1.53 mg/dL — ABNORMAL HIGH (ref 0.40–1.50)
GFR: 42.84 mL/min — ABNORMAL LOW (ref >60.00)
Glucose, Bld: 128 mg/dL — ABNORMAL HIGH (ref 70–99)
Phosphorus: 2.7 mg/dL (ref 2.3–4.6)
Potassium: 3.6 mEq/L (ref 3.5–5.1)
Sodium: 139 mEq/L (ref 135–145)

## 2021-06-12 LAB — CBC
HCT: 41.7 % (ref 39.0–52.0)
Hemoglobin: 14.3 g/dL (ref 13.0–17.0)
MCHC: 34.4 g/dL (ref 30.0–36.0)
MCV: 94.3 fl (ref 78.0–100.0)
Platelets: 232 10*3/uL (ref 150.0–400.0)
RBC: 4.42 Mil/uL (ref 4.22–5.81)
RDW: 13.5 % (ref 11.5–15.5)
WBC: 6.5 10*3/uL (ref 4.0–10.5)

## 2021-06-12 LAB — LIPID PANEL
Cholesterol: 113 mg/dL (ref 0–200)
HDL: 43.5 mg/dL (ref >39.00)
LDL Cholesterol: 52 mg/dL (ref 0–99)
NonHDL: 69.47
Total CHOL/HDL Ratio: 3
Triglycerides: 87 mg/dL (ref 0.0–149.0)
VLDL: 17.4 mg/dL (ref 0.0–40.0)

## 2021-06-12 MED ORDER — IBUPROFEN 800 MG PO TABS
800.0000 mg | ORAL_TABLET | Freq: Every day | ORAL | 0 refills | Status: DC | PRN
Start: 2021-06-12 — End: 2022-06-18

## 2021-06-12 NOTE — Assessment & Plan Note (Signed)
Discussed albuterol prn only

## 2021-06-12 NOTE — Progress Notes (Signed)
Hearing Screening - Comments:: Has hearing aids. Wearing them today. Vision Screening - Comments:: February 2022

## 2021-06-12 NOTE — Assessment & Plan Note (Signed)
I have personally reviewed the Medicare Annual Wellness questionnaire and have noted 1. The patient's medical and social history 2. Their use of alcohol, tobacco or illicit drugs 3. Their current medications and supplements 4. The patient's functional ability including ADL's, fall risks, home safety risks and hearing or visual             impairment. 5. Diet and physical activities 6. Evidence for depression or mood disorders  The patients weight, height, BMI and visual acuity have been recorded in the chart I have made referrals, counseling and provided education to the patient based review of the above and I have provided the pt with a written personalized care plan for preventive services.  I have provided you with a copy of your personalized plan for preventive services. Please take the time to review along with your updated medication list.  One last colon due later this year No PSA due to age Needs COVID booster---will wait for new one Flu vaccine in the fall Better about regular exercise

## 2021-06-12 NOTE — Progress Notes (Signed)
Subjective:    Patient ID: Caleb Gray, male    DOB: 11/15/1940, 80 y.o.   MRN: [id number]  HPI Here for Medicare wellness visit and follow up of chronic health conditions This visit occurred during the SARS-CoV-2 public health emergency.  Safety protocols were in place, including screening questions prior to the visit, additional usage of staff PPE, and extensive cleaning of exam room while observing appropriate contact time as indicated for disinfecting solutions.   Reviewed form and advanced directives [facility]/resistance Not golfing in this heat--hopes to get back to that Vision is okay Hearing aides help some No falls Mood generally okay Independent with instrumental ADLs No sig memory issues  No chest pain or SOB No dizziness or syncope Exercise tolerance is fairly good No edema No palpitations  Continues on statin No problems with this  Ongoing back pain at times Seems better now---?related to core work he is doing Only uses the ibuprofen rarely  Mood is okay Still has some bad days and temporary down times Not anhedonic---but he doesn't "do as much as I would like to"  No asthma problems No wheezing or cough Does use the albuterol in the morning---discussed using just prn  Reviewed last labs---GFR 46  Current Outpatient Medications on File Prior to Visit  Medication Sig Dispense Refill   albuterol (VENTOLIN HFA) 108 (90 Base) MCG/ACT inhaler Inhale 2 puffs into the lungs every 4 (four) hours as needed for wheezing or shortness of breath. 8 g 3   amLODipine (NORVASC) 10 MG tablet TAKE 1 TABLET DAILY 90 tablet 3   aspirin 81 MG tablet Take 81 mg by mouth daily.       atorvastatin (LIPITOR) 20 MG tablet TAKE 1 TABLET AT BEDTIME 90 tablet 3   enalapril (VASOTEC) 20 MG tablet TAKE 1 TABLET DAILY 90 tablet 3   hydrochlorothiazide (MICROZIDE) 12.5 MG capsule TAKE 1 CAPSULE BY MOUTH EVERY DAY IN THE MORNING  90 capsule 3   Multiple Vitamins-Minerals (CENTRUM SILVER 50+MEN PO) Take by mouth.     Omega-3 Fatty Acids (FISH OIL) 1000 MG CAPS Take by mouth 2 (two) times daily.      No current facility-administered medications on file prior to visit.    Allergies  Allergen Reactions   Penicillins     Very dry and itchy skin, make throat "feel funny:    Past Medical History:  Diagnosis Date   ANEMIA-NOS    Arthritis    back   ASTHMA    Blood transfusion without reported diagnosis    12 years ago when had surgery for kidney mass   Cataract    Chronic diastolic heart failure ([facility])    Colon cancer ([place])    A[place] 1997 stage III T3, N1 s/p rectosigmoid resection   CORONARY ARTERY DISEASE    HYPERLIPIDEMIA    HYPERTENSION    Low back pain 10/2009   s/p laminectomy/decompression L4-5   Myocardial infarction [facility])    Right kidney mass 02/2007   benign (oncocytoma) s/p resection/total nephrectomy   UNSPECIFIED PERIPHERAL VASCULAR DISEASE     Past Surgical History:  Procedure Laterality Date   COLONOSCOPY     LUMBAR LAMINECTOMY/DECOMPRESSION MICRODISCECTOMY  10/2009   NEPHRECTOMY  02/2007   Right   POLYPECTOMY     Rectosigmoidectomy  1997    Family History  Problem Relation Age of Onset   Colon cancer Mother    Heart disease Father  Alcohol abuse Other        Parents   Esophageal cancer Neg Hx    Rectal cancer Neg Hx    Stomach cancer Neg Hx     Social History   Socioeconomic History   Marital status: Widowed    Spouse name: Not on file   Number of children: 2   Years of education: Not on file   Highest education level: Not on file  Occupational History   Occupation: IT--systems     Comment: retired  Tobacco Use   Smoking status: Former    Types: Cigarettes    Quit date: 11/11/1968    Years since quitting: 52.6   Smokeless tobacco: Never  Vaping Use   Vaping Use: Never used  Substance and Sexual Activity   Alcohol use: Yes    Alcohol/week: 0.0 standard drinks     Comment: occasionally   Drug use: Never   Sexual activity: Not on file  Other Topics Concern   Not on file  Social History Narrative   Moved from [place] 2/20      Has living will    Daughter is health care POA   Would accept resuscitation   No prolonged machines or feeding tube   Social Determinants of Health   Financial Resource Strain: Not on file  Food Insecurity: Not on file  Transportation Needs: Not on file  Physical Activity: Not on file  Stress: Not on file  Social Connections: Not on file  Intimate Partner Violence: Not on file   Review of Systems Appetite is fine Weight stable Sleeps well Wears seat belt Teeth are fine---keeps up with dentist No suspicious skin lesions other than cysts(?) on his scrotum Occasional mild heartburn 2 hours after eating---rare. No dysphagia Bowels are fine--no blood Voids okay---stream erratic though (slow at times). Nothing worrisome  No other sig joint pains    Objective:   Physical Exam Constitutional:      Appearance: Normal appearance.  HENT:     Nose:     Comments:  No lesions    Mouth/Throat:     Comments: No lesions Upper plate Eyes:     Conjunctiva/sclera: Conjunctivae normal.     Pupils: Pupils are equal, round, and reactive to light.  Cardiovascular:     Rate and Rhythm: Normal rate and regular rhythm.     Pulses: Normal pulses.     Heart sounds: No murmur heard.   No gallop.  Pulmonary:     Effort: Pulmonary effort is normal.     Breath sounds: Normal breath sounds. No wheezing or rales.  Abdominal:     Palpations: Abdomen is soft.     Tenderness: There is no abdominal tenderness.  Musculoskeletal:     Cervical back: Neck supple.     Comments: Trace ankle edema  Lymphadenopathy:     Cervical: No cervical adenopathy.  Skin:    General: Skin is warm.     Comments: Seems to have small vascular lesions on L>R scrotum (and small benign looking mass). Will get to derm soon if any  change  Neurological:     Mental Status: He is alert and oriented to person, place, and time.     Comments: President--- "Waymond Cera, Obama" [phone] [id number] Recall 3/3  Psychiatric:        Mood and Affect: Mood normal.        Behavior: Behavior normal.  Assessment & Plan:

## 2021-06-12 NOTE — Assessment & Plan Note (Signed)
See social history 

## 2021-06-12 NOTE — Assessment & Plan Note (Signed)
No angina On ASA, statin, enalapril

## 2021-06-12 NOTE — Assessment & Plan Note (Signed)
BP Readings from Last 3 Encounters:  06/12/21 134/72  06/07/20 (!) 120/60  06/06/20 (!) 132/70   Good control on amlodipine and enalapril

## 2021-06-12 NOTE — Assessment & Plan Note (Signed)
Is on ACEI Will recheck labs

## 2021-06-12 NOTE — Assessment & Plan Note (Signed)
Mild depression--- mostly grieving. Is better now

## 2021-07-27 ENCOUNTER — Other Ambulatory Visit: Payer: Self-pay | Admitting: Internal Medicine

## 2021-08-22 ENCOUNTER — Ambulatory Visit (INDEPENDENT_AMBULATORY_CARE_PROVIDER_SITE_OTHER): Payer: Medicare Other | Admitting: Cardiology

## 2021-08-22 ENCOUNTER — Other Ambulatory Visit: Payer: Self-pay

## 2021-08-22 VITALS — BP 150/80 | HR 82 | Ht 68.0 in | Wt 191.0 lb

## 2021-08-22 DIAGNOSIS — M545 Low back pain, unspecified: Secondary | ICD-10-CM | POA: Diagnosis not present

## 2021-08-22 DIAGNOSIS — N1831 Chronic kidney disease, stage 3a: Secondary | ICD-10-CM

## 2021-08-22 DIAGNOSIS — I2510 Atherosclerotic heart disease of native coronary artery without angina pectoris: Secondary | ICD-10-CM

## 2021-08-22 DIAGNOSIS — I10 Essential (primary) hypertension: Secondary | ICD-10-CM

## 2021-08-22 NOTE — Patient Instructions (Signed)
Medication Instructions:  The current medical regimen is effective;  continue present plan and medications.  *If you need a refill on your cardiac medications before your next appointment, please call your pharmacy*  Follow-Up: At [facility], you and your health needs are our priority.  As part of our continuing mission to provide you with exceptional heart care, we have created designated Provider Care Teams.  These Care Teams include your primary Cardiologist (physician) and Advanced Practice Providers (APPs -  Physician Assistants and Nurse Practitioners) who all work together to provide you with the care you need, when you need it.  We recommend signing up for the patient portal called "MyChart".  Sign up information is provided on this After Visit Summary.  MyChart is used to connect with patients for Virtual Visits (Telemedicine).  Patients are able to view lab/test results, encounter notes, upcoming appointments, etc.  Non-urgent messages can be sent to your provider as well.   To learn more about what you can do with MyChart, go to [email or web address].    Your next appointment:   1 year(s)  The format for your next appointment:   In Person  Provider:   Mark Skains, MD   Thank you for choosing  [facility]Care!!    

## 2021-08-22 NOTE — Progress Notes (Signed)
Cardiology Office Note:    Date:  08/22/2021   ID:  Caleb Gray, DOB January 15, 1941, MRN [id number]  PCP:  [name], MD   [facility] Providers Cardiologist:  Candee Furbish, MD    Referring MD: [name], MD     History of Present Illness:    Caleb Gray is a 80 y.o. male here for follow-up of coronary artery disease.  medical management for cardiac catheterization demonstrated 70% mid LAD, 90% distal RCA given lack of symptoms. Low risk Myoview in 04-Feb-2008. Walks golfs. Echocardiogram with normal ejection fraction, normal RV. Complaint of low back pain has been chronic.   He also has a history of colon cancer, hypertension, right nephrectomy in February 04, 2007.   Wife died pancreatic cancer for 5 years. In 2//26/2020 died. Hiatal hernia. Surgery 17 days in hospital. 10 days later at home died. Acute resp syndrome. He was wondering if could have been associated with COVID-19.   Had some left hearing loss, possible neuritis, took prednisone  BP 131/75 - 115/70 at home.   Overall doing quite well.  He did lose his wife recently and is currently on her [employer] from [place].  He does need to find a Medicare plan.  This has been challenging due to cost.  He will continue to do research.  Overall he is not having any anginal symptoms no fever chills nausea vomiting syncope bleeding.  Past Medical History:  Diagnosis Date   ANEMIA-NOS    Arthritis    back   ASTHMA    Blood transfusion without reported diagnosis    12 years ago when had surgery for kidney mass   Cataract    Chronic diastolic heart failure ([place])    Colon cancer ([place])    [place] 1997 stage III T3, N1 s/p rectosigmoid resection   CORONARY ARTERY DISEASE    HYPERLIPIDEMIA    HYPERTENSION    Low back pain 10/2009   s/p laminectomy/decompression L4-5   Myocardial infarction [facility])    Right kidney mass 02/2007   benign (oncocytoma) s/p resection/total nephrectomy   UNSPECIFIED PERIPHERAL VASCULAR  DISEASE     Past Surgical History:  Procedure Laterality Date   COLONOSCOPY     LUMBAR LAMINECTOMY/DECOMPRESSION MICRODISCECTOMY  10/2009   NEPHRECTOMY  02/2007   Right   POLYPECTOMY     Rectosigmoidectomy  02/04/1996    Current Medications: Current Meds  Medication Sig   albuterol (VENTOLIN HFA) 108 (90 Base) MCG/ACT inhaler Inhale 2 puffs into the lungs every 4 (four) hours as needed for wheezing or shortness of breath.   amLODipine (NORVASC) 10 MG tablet TAKE 1 TABLET DAILY   aspirin 81 MG tablet Take 81 mg by mouth daily.     atorvastatin (LIPITOR) 20 MG tablet TAKE 1 TABLET AT BEDTIME   enalapril (VASOTEC) 20 MG tablet TAKE 1 TABLET DAILY   hydrochlorothiazide (MICROZIDE) 12.5 MG capsule TAKE 1 CAPSULE BY MOUTH EVERY DAY IN THE MORNING   ibuprofen (ADVIL) 800 MG tablet Take 1 tablet (800 mg total) by mouth daily as needed.   Multiple Vitamins-Minerals (CENTRUM SILVER 50+MEN PO) Take by mouth.   Omega-3 Fatty Acids (FISH OIL) 1000 MG CAPS Take by mouth 2 (two) times daily.      Allergies:   Penicillins   Social History   Socioeconomic History   Marital status: Widowed    Spouse name: Not on file   Number of children: 2   Years of education: Not on file  Highest education level: Not on file  Occupational History   Occupation: IT--systems     Comment: retired  Tobacco Use   Smoking status: Former    Types: Cigarettes    Quit date: 11/11/1968    Years since quitting: 52.8   Smokeless tobacco: Never  Vaping Use   Vaping Use: Never used  Substance and Sexual Activity   Alcohol use: Yes    Alcohol/week: 0.0 standard drinks    Comment: occasionally   Drug use: Never   Sexual activity: Not on file  Other Topics Concern   Not on file  Social History Narrative   Moved from [place] 2/20      Has living will    Daughter is health care POA   Would accept resuscitation   No prolonged machines or feeding tube   Social Determinants of Health   Financial  Resource Strain: Not on file  Food Insecurity: Not on file  Transportation Needs: Not on file  Physical Activity: Not on file  Stress: Not on file  Social Connections: Not on file     Family History: The patient's family history includes Alcohol abuse in an other family member; Colon cancer in his mother; Heart disease in his father. There is no history of Esophageal cancer, Rectal cancer, or Stomach cancer.  ROS:   Please see the history of present illness.     All other systems reviewed and are negative.  EKGs/Labs/Other Studies Reviewed:    The following studies were reviewed today: ECHO 06/2014  - EF 60-65%, mild LVH, normal RV size and systolic function.      ETT-myoview 8/09  - fixed deficit at basal inferior segment (small), low risk.    Cath 02/2007 in [place]:  - 70% mid LAD, 90% dist RCA managed medically    EKG:  EKG is  ordered today.  The ekg ordered today demonstrates sinus rhythm 79 no other abnormalities.  Recent Labs: 06/12/2021: ALT 32; BUN 20; Creatinine, Ser 1.53; Hemoglobin 14.3; Platelets 232.0; Potassium 3.6; Sodium 139  Recent Lipid Panel    Component Value Date/Time   CHOL 113 06/12/2021 0958   TRIG 87.0 06/12/2021 0958   TRIG 79 03/08/2009 0000   HDL 43.50 06/12/2021 0958   CHOLHDL 3 06/12/2021 0958   VLDL 17.4 06/12/2021 0958   LDLCALC 52 06/12/2021 0958   LDLCALC 62 03/08/2009 0000   LDLDIRECT 159.7 11/28/2009 0918     Risk Assessment/Calculations:          Physical Exam:    VS:  BP (!) 150/80 (BP Location: Left Arm, Patient Position: Sitting, Cuff Size: Normal)   Pulse 82   Ht 5\' 8"  (1.727 m)   Wt 191 lb (86.6 kg)   SpO2 98%   BMI 29.04 kg/m     Wt Readings from Last 3 Encounters:  08/22/21 191 lb (86.6 kg)  06/12/21 187 lb (84.8 kg)  06/07/20 190 lb (86.2 kg)     GEN:  Well nourished, well developed in no acute distress HEENT: Normal NECK: No JVD; No carotid bruits LYMPHATICS: No lymphadenopathy CARDIAC: RRR, no  murmurs, rubs, gallops RESPIRATORY:  Clear to auscultation without rales, wheezing or rhonchi  ABDOMEN: Soft, non-tender, non-distended MUSCULOSKELETAL:  No edema; No deformity  SKIN: Warm and dry NEUROLOGIC:  Alert and oriented x 3 PSYCHIATRIC:  Normal affect   ASSESSMENT:    1. Atherosclerosis of native coronary artery of native heart without angina pectoris   2. Essential  hypertension   3. CAD in native artery   4. Essential (primary) hypertension   5. Stage 3a chronic kidney disease ([place])   6. Bilateral low back pain without sciatica, unspecified chronicity    PLAN:    In order of problems listed above: CAD in native artery Currently doing well without any chest pain no anginal symptoms.  Back in 2015 echocardiogram showed normal ejection fraction.  Continue with his low-dose aspirin, no bleeding, statin on atorvastatin 20 mg, enalapril.  Prior cardiac catheterization reviewed as above.  Explained to him the nature of his coronary artery disease.  If symptoms change, we can always pursue further testing.  He is also taking fish oil.  This is reasonable as long as he is not having any bleeding issues.  Essential (primary) hypertension Continue with current blood pressure control.  Here in the office it is often high but at home it is normal.  No changes made.  Medications reviewed as above.  Continue with current medical management.  Stage 3a chronic kidney disease (HCC) Currently on enalapril, ACE inhibitor.  No changes made.  Low back pain Currently working out at the gym.  Helping with his back pain.   He is working on [employer], possibly an advantage plan.  He does have underlying coronary artery disease which increases his premium on straight Medicare.      Medication Adjustments/Labs and Tests Ordered: Current medicines are reviewed at length with the patient today.  Concerns regarding medicines are outlined above.  Orders Placed This Encounter  Procedures    EKG 12-Lead   No orders of the defined types were placed in this encounter.   Patient Instructions  Medication Instructions:  The current medical regimen is effective;  continue present plan and medications.  *If you need a refill on your cardiac medications before your next appointment, please call your pharmacy*  Follow-Up: At [facility], you and your health needs are our priority.  As part of our continuing mission to provide you with exceptional heart care, we have created designated Provider Care Teams.  These Care Teams include your primary Cardiologist (physician) and Advanced Practice Providers (APPs -  Physician Assistants and Nurse Practitioners) who all work together to provide you with the care you need, when you need it.  We recommend signing up for the patient portal called "MyChart".  Sign up information is provided on this After Visit Summary.  MyChart is used to connect with patients for Virtual Visits (Telemedicine).  Patients are able to view lab/test results, encounter notes, upcoming appointments, etc.  Non-urgent messages can be sent to your provider as well.   To learn more about what you can do with MyChart, go to [email or web address].    Your next appointment:   1 year(s)  The format for your next appointment:   In Person  Provider:   Candee Furbish, MD   Thank you for choosing [facility]!!     Signed, Candee Furbish, MD  08/22/2021 10:55 AM    [place]

## 2021-08-22 NOTE — Assessment & Plan Note (Signed)
Currently working out at [employer].  Helping with his back pain.

## 2021-08-22 NOTE — Assessment & Plan Note (Signed)
Currently doing well without any chest pain no anginal symptoms.  Back in 2015 echocardiogram showed normal ejection fraction.  Continue with his low-dose aspirin, no bleeding, statin on atorvastatin 20 mg, enalapril.  Prior cardiac catheterization reviewed as above.  Explained to him the nature of his coronary artery disease.  If symptoms change, we can always pursue further testing.  He is also taking fish oil.  This is reasonable as long as he is not having any bleeding issues.

## 2021-08-22 NOTE — Assessment & Plan Note (Signed)
Continue with current blood pressure control.  Here in the office it is often high but at home it is normal.  No changes made.  Medications reviewed as above.  Continue with current medical management.

## 2021-08-22 NOTE — Assessment & Plan Note (Signed)
Currently on enalapril, ACE inhibitor.  No changes made.

## 2021-08-30 DIAGNOSIS — Z23 Encounter for immunization: Secondary | ICD-10-CM | POA: Diagnosis not present

## 2021-09-19 ENCOUNTER — Other Ambulatory Visit: Payer: Self-pay | Admitting: Internal Medicine

## 2021-09-27 ENCOUNTER — Encounter: Payer: Self-pay | Admitting: Internal Medicine

## 2021-10-02 ENCOUNTER — Other Ambulatory Visit: Payer: Self-pay | Admitting: Internal Medicine

## 2021-10-02 NOTE — Telephone Encounter (Signed)
Pt is returning call.  

## 2021-10-02 NOTE — Telephone Encounter (Signed)
Spoke to pt. He did not need the refill right now. He did say that he is having to use the albuterol inhaler every morning to clear his chest. I advised him I would get with Dr Silvio Pate. It is not always good practice to take that everyday.

## 2021-10-02 NOTE — Telephone Encounter (Signed)
Patients states this was an automatic refill. He got a phone call asking to refill the prescription and he said yes.  Please return his call when able.

## 2021-10-02 NOTE — Telephone Encounter (Signed)
Left message to call office. [facility] states they just filled this 09-16-21. Looks like there are refills. Was this an automatic request? Is he using the inhaler that much?

## 2021-10-03 NOTE — Telephone Encounter (Signed)
Left message on VM for pt with Dr Alla German note.

## 2021-11-13 ENCOUNTER — Encounter: Payer: Self-pay | Admitting: Internal Medicine

## 2021-12-10 ENCOUNTER — Other Ambulatory Visit: Payer: Self-pay

## 2021-12-10 MED ORDER — ENALAPRIL MALEATE 20 MG PO TABS: 20.0000 mg | ORAL_TABLET | Freq: Every day | ORAL | 1 refills | Status: DC

## 2021-12-13 ENCOUNTER — Encounter: Payer: Self-pay | Admitting: Internal Medicine

## 2021-12-13 ENCOUNTER — Ambulatory Visit: Payer: Medicare HMO | Admitting: Internal Medicine

## 2021-12-13 VITALS — BP 142/80 | HR 82 | Ht 68.0 in | Wt 188.2 lb

## 2021-12-13 DIAGNOSIS — Z86010 Personal history of colonic polyps: Secondary | ICD-10-CM

## 2021-12-13 DIAGNOSIS — Z85038 Personal history of other malignant neoplasm of large intestine: Secondary | ICD-10-CM

## 2021-12-13 MED ORDER — PLENVU 140 G PO SOLR: 1.0000 | Freq: Once | ORAL | 0 refills | Status: AC

## 2021-12-13 NOTE — Progress Notes (Signed)
HISTORY OF PRESENT ILLNESS:  Caleb Gray is a 81 y.o. male with past medical history as listed below.  He has a history of colon cancer diagnosed in 66 in [place] status post surgical resection (T3, N1).  He also has a history of sessile serrated and adenomatous colon polyps.  Most recent examination is 2009, 2014, 2019 (4 polyps).  Follow-up in 3 years recommended.  He presents today regarding surveillance.  Patient is pleased to report that he continues to enjoy good health.  He remains active.  His GI review of systems is negative.  Review of outside blood work from August 2022 reveals a normal CBC with hemoglobin 14.3.  Unremarkable comprehensive metabolic panel.  Creatinine 1.53.  REVIEW OF SYSTEMS:  All non-GI ROS negative unless otherwise stated in the HPI except for arthritis, hearing problems  Past Medical History:  Diagnosis Date   ANEMIA-NOS    Arthritis    back   ASTHMA    Blood transfusion without reported diagnosis    12 years ago when had surgery for kidney mass   Cataract    Chronic diastolic heart failure ([place])    Colon cancer ([place])    [place] 1997 stage III T3, N1 s/p rectosigmoid resection   CORONARY ARTERY DISEASE    HYPERLIPIDEMIA    HYPERTENSION    Low back pain 10/2009   s/p laminectomy/decompression L4-5   Myocardial infarction [facility])    Right kidney mass 02/2007   benign (oncocytoma) s/p resection/total nephrectomy   UNSPECIFIED PERIPHERAL VASCULAR DISEASE     Past Surgical History:  Procedure Laterality Date   COLONOSCOPY     LUMBAR LAMINECTOMY/DECOMPRESSION MICRODISCECTOMY  10/2009   NEPHRECTOMY  02/2007   Right   POLYPECTOMY     Rectosigmoidectomy  1997    Social History Caleb Gray  reports that he quit smoking about 53 years ago. His smoking use included cigarettes. He has never used smokeless tobacco. He reports current alcohol use. He reports that he does not use drugs.  family history includes Alcohol abuse in an other family  member; Colon cancer in his mother; Diabetes in his mother; Heart disease in his father.  Allergies  Allergen Reactions   Penicillins     Very dry and itchy skin, make throat "feel funny:       PHYSICAL EXAMINATION: Vital signs: BP (!) 142/80    Pulse 82    Ht 5\' 8"  (1.727 m)    Wt 188 lb 4 oz (85.4 kg)    SpO2 97%    BMI 28.62 kg/m   Constitutional: generally well-appearing, no acute distress Psychiatric: alert and oriented x3, cooperative Eyes: extraocular movements intact, anicteric, conjunctiva pink Mouth: oral pharynx moist, no lesions Neck: supple no lymphadenopathy Cardiovascular: heart regular rate and rhythm, no murmur Lungs: clear to auscultation bilaterally Abdomen: soft, nontender, nondistended, no obvious ascites, no peritoneal signs, normal bowel sounds, no organomegaly Rectal: Deferred until colonoscopy Extremities: no clubbing, cyanosis, or lower extremity edema bilaterally Skin: no lesions on visible extremities Neuro: No focal deficits.  Cranial nerves intact.  Hearing aid  ASSESSMENT:  1.  Personal history of colon cancer 2.  Personal history of sessile serrated and adenomatous colon polyps. 3.  Overall health good   PLAN:  1.  Surveillance colonoscopy.  The patient is motivated and interested.  He is an appropriate candidate without contraindicationThe nature of the procedure, as well as the risks, benefits, and alternatives were carefully and thoroughly reviewed with the patient. Ample  time for discussion and questions allowed. The patient understood, was satisfied, and agreed to proceed.

## 2021-12-13 NOTE — Patient Instructions (Signed)
If you are age 81 or older, your body mass index should be between 23-30. Your Body mass index is 28.62 kg/m. If this is out of the aforementioned range listed, please consider follow up with your Primary Care Provider.  If you are age 33 or younger, your body mass index should be between 19-25. Your Body mass index is 28.62 kg/m. If this is out of the aformentioned range listed, please consider follow up with your Primary Care Provider.   ________________________________________________________  The [facility] providers would like to encourage you to use [facility] to communicate with providers for non-urgent requests or questions.  Due to long hold times on the telephone, sending your provider a message by [facility] may be a faster and more efficient way to get a response.  Please allow 48 business hours for a response.  Please remember that this is for non-urgent requests.  _______________________________________________________  Caleb Gray have been scheduled for a colonoscopy. Please follow written instructions given to you at your visit today.  Please pick up your prep supplies at the pharmacy within the next 1-3 days. If you use inhalers (even only as needed), please bring them with you on the day of your procedure.

## 2022-01-09 DIAGNOSIS — Z008 Encounter for other general examination: Secondary | ICD-10-CM | POA: Diagnosis not present

## 2022-01-09 DIAGNOSIS — Z791 Long term (current) use of non-steroidal anti-inflammatories (NSAID): Secondary | ICD-10-CM | POA: Diagnosis not present

## 2022-01-09 DIAGNOSIS — R69 Illness, unspecified: Secondary | ICD-10-CM | POA: Diagnosis not present

## 2022-01-09 DIAGNOSIS — I10 Essential (primary) hypertension: Secondary | ICD-10-CM | POA: Diagnosis not present

## 2022-01-09 DIAGNOSIS — Z809 Family history of malignant neoplasm, unspecified: Secondary | ICD-10-CM | POA: Diagnosis not present

## 2022-01-09 DIAGNOSIS — Z8673 Personal history of transient ischemic attack (TIA), and cerebral infarction without residual deficits: Secondary | ICD-10-CM | POA: Diagnosis not present

## 2022-01-09 DIAGNOSIS — Z833 Family history of diabetes mellitus: Secondary | ICD-10-CM | POA: Diagnosis not present

## 2022-01-09 DIAGNOSIS — E785 Hyperlipidemia, unspecified: Secondary | ICD-10-CM | POA: Diagnosis not present

## 2022-01-09 DIAGNOSIS — Z85038 Personal history of other malignant neoplasm of large intestine: Secondary | ICD-10-CM | POA: Diagnosis not present

## 2022-01-09 DIAGNOSIS — I252 Old myocardial infarction: Secondary | ICD-10-CM | POA: Diagnosis not present

## 2022-01-09 DIAGNOSIS — Z8249 Family history of ischemic heart disease and other diseases of the circulatory system: Secondary | ICD-10-CM | POA: Diagnosis not present

## 2022-01-09 DIAGNOSIS — Z880 Allergy status to penicillin: Secondary | ICD-10-CM | POA: Diagnosis not present

## 2022-01-10 DIAGNOSIS — H26493 Other secondary cataract, bilateral: Secondary | ICD-10-CM | POA: Diagnosis not present

## 2022-01-29 ENCOUNTER — Telehealth: Payer: Self-pay | Admitting: Internal Medicine

## 2022-01-29 MED ORDER — ATORVASTATIN CALCIUM 20 MG PO TABS: 20.0000 mg | ORAL_TABLET | Freq: Every day | ORAL | 3 refills | Status: DC

## 2022-01-29 MED ORDER — AMLODIPINE BESYLATE 10 MG PO TABS: 10.0000 mg | ORAL_TABLET | Freq: Every day | ORAL | 3 refills | Status: DC

## 2022-01-29 NOTE — Telephone Encounter (Signed)
Please change the scripts to the following scripts: ? ?atorvastatin (LIPITOR) 20 MG tablet ?amLODipine (NORVASC) 10 MG tablet ? ?[facility] ? [id number] [place]  [phone] ?Fax:  [phone] ?  ? ?

## 2022-02-04 ENCOUNTER — Encounter: Payer: Self-pay | Admitting: Internal Medicine

## 2022-02-07 ENCOUNTER — Encounter: Payer: Self-pay | Admitting: Internal Medicine

## 2022-02-07 ENCOUNTER — Ambulatory Visit (AMBULATORY_SURGERY_CENTER): Payer: Medicare HMO | Admitting: Internal Medicine

## 2022-02-07 VITALS — BP 137/76 | HR 78 | Temp 98.4°F | Resp 15 | Ht 68.0 in | Wt 191.0 lb

## 2022-02-07 DIAGNOSIS — Z85038 Personal history of other malignant neoplasm of large intestine: Secondary | ICD-10-CM | POA: Diagnosis not present

## 2022-02-07 DIAGNOSIS — Z86010 Personal history of colonic polyps: Secondary | ICD-10-CM | POA: Diagnosis not present

## 2022-02-07 MED ORDER — SODIUM CHLORIDE 0.9 % IV SOLN: 500.0000 mL | Freq: Once | INTRAVENOUS | Status: DC

## 2022-02-07 NOTE — Progress Notes (Signed)
Pt's states no medical or surgical changes since previsit or office visit. 

## 2022-02-07 NOTE — Progress Notes (Signed)
Sedate, gd SR, tolerated procedure well, VSS, report to RN 

## 2022-02-07 NOTE — Op Note (Signed)
[place] ?Patient Name: Caleb Gray ?Procedure Date: 02/07/2022 10:39 AM ?MRN: [id number] ?Endoscopist: Docia Chuck. Henrene Pastor , MD ?Age: 81 ?Referring MD:  ?Date of Birth: 1941/01/10 ?Gender: Male ?Account #: [id number] ?Procedure:                Colonoscopy ?Indications:              High risk colon cancer surveillance: Personal  ?                          history of non-advanced adenoma, High risk colon  ?                          cancer surveillance: Personal history of sessile  ?                          serrated colon polyp (less than 10 mm in size) with  ?                          no dysplasia, High risk colon cancer surveillance:  ?                          Personal history of colon cancer. Diagnosed with  ?                          colon cancer in [place] in 1997 status  ?                          postresection. Most recent surveillance  ?                          examinations 2009, 2014, 2019. ?Medicines:                Monitored Anesthesia Care ?Procedure:                Pre-Anesthesia Assessment: ?                          - Prior to the procedure, a History and Physical  ?                          was performed, and patient medications and  ?                          allergies were reviewed. The patient's tolerance of  ?                          previous anesthesia was also reviewed. The risks  ?                          and benefits of the procedure and the sedation  ?                          options and risks were discussed with the patient.  ?  All questions were answered, and informed consent  ?                          was obtained. Prior Anticoagulants: The patient has  ?                          taken no previous anticoagulant or antiplatelet  ?                          agents. ASA Grade Assessment: II - A patient with  ?                          mild systemic disease. After reviewing the risks  ?                          and benefits, the patient was deemed in  ?                           satisfactory condition to undergo the procedure. ?                          After obtaining informed consent, the colonoscope  ?                          was passed under direct vision. Throughout the  ?                          procedure, the patient's blood pressure, pulse, and  ?                          oxygen saturations were monitored continuously. The  ?                          CF [id number] #[id number] was introduced through the anus  ?                          and advanced to the the cecum, identified by  ?                          appendiceal orifice and ileocecal valve. The  ?                          ileocecal valve, appendiceal orifice, and rectum  ?                          were photographed. The quality of the bowel  ?                          preparation was excellent. The colonoscopy was  ?                          performed without difficulty. The patient tolerated  ?  the procedure well. The bowel preparation used was  ?                          SUPREP via split dose instruction. ?Scope In: 10:55:15 AM ?Scope Out: 11:05:22 AM ?Scope Withdrawal Time: 0 hours 7 minutes 24 seconds  ?Total Procedure Duration: 0 hours 10 minutes 7 seconds  ?Findings:                 Multiple diverticula were found in the sigmoid  ?                          colon. Internal hemorrhoids. ?                          The exam was otherwise without abnormality on  ?                          direct and retroflexion views. Healthy surgical  ?                          anastomosis at 20 cm. ?Complications:            No immediate complications. Estimated blood loss:  ?                          None. ?Estimated Blood Loss:     Estimated blood loss: none. ?Impression:               - Diverticulosis in the sigmoid colon. Internal  ?                          hemorrhoids. ?                          - The examination was otherwise normal on direct  ?                          and retroflexion  views. ?                          - No specimens collected. ?Recommendation:           - Repeat colonoscopy is not recommended for  ?                          surveillance. ?                          - Patient has a contact number available for  ?                          emergencies. The signs and symptoms of potential  ?                          delayed complications were discussed with the  ?                          patient. Return to normal activities  tomorrow.  ?                          Written discharge instructions were provided to the  ?                          patient. ?                          - Resume previous diet. ?                          - Continue present medications. ?Docia Chuck. Henrene Pastor, MD ?02/07/2022 11:15:53 AM ?This report has been signed electronically. ?

## 2022-02-07 NOTE — Patient Instructions (Signed)
Read all of the handouts given to you by your recovery room nurse. ? ?YOU HAD AN ENDOSCOPIC PROCEDURE TODAY AT [facility]:   Refer to the procedure report that was given to you for any specific questions about what was found during the examination.  If the procedure report does not answer your questions, please call your gastroenterologist to clarify.  If you requested that your care partner not be given the details of your procedure findings, then the procedure report has been included in a sealed envelope for you to review at your convenience later. ? ?YOU SHOULD EXPECT: Some feelings of bloating in the abdomen. Passage of more gas than usual.  Walking can help get rid of the air that was put into your GI tract during the procedure and reduce the bloating. If you had a lower endoscopy (such as a colonoscopy or flexible sigmoidoscopy) you may notice spotting of blood in your stool or on the toilet paper. If you underwent a bowel prep for your procedure, you may not have a normal bowel movement for a few days. ? ?Please Note:  You might notice some irritation and congestion in your nose or some drainage.  This is from the oxygen used during your procedure.  There is no need for concern and it should clear up in a day or so. ? ?SYMPTOMS TO REPORT IMMEDIATELY: ? ?Following lower endoscopy (colonoscopy or flexible sigmoidoscopy): ? Excessive amounts of blood in the stool ? Significant tenderness or worsening of abdominal pains ? Swelling of the abdomen that is new, acute ? Fever of 100?F or higher ? ? ?For urgent or emergent issues, a gastroenterologist can be reached at any hour by calling [phone]. ?Do not use MyChart messaging for urgent concerns.  ? ? ?DIET:  We do recommend a small meal at first, but then you may proceed to your regular diet.  Drink plenty of fluids but you should avoid alcoholic beverages for 24 hours. ? ?ACTIVITY:  You should plan to take it easy for the rest of today and  you should NOT DRIVE or use heavy machinery until tomorrow (because of the sedation medicines used during the test).   ? ?FOLLOW UP: ?Our staff will call the number listed on your records 48-72 hours following your procedure to check on you and address any questions or concerns that you may have regarding the information given to you following your procedure. If we do not reach you, we will leave a message.  We will attempt to reach you two times.  During this call, we will ask if you have developed any symptoms of COVID 19. If you develop any symptoms (ie: fever, flu-like symptoms, shortness of breath, cough etc.) before then, please call [phone].  If you test positive for Covid 19 in the 2 weeks post procedure, please call and report this information to [place].   ? ? ?SIGNATURES/CONFIDENTIALITY: ?You and/or your care partner have signed paperwork which will be entered into your electronic medical record.  These signatures attest to the fact that that the information above on your After Visit Summary has been reviewed and is understood.  Full responsibility of the confidentiality of this discharge information lies with you and/or your care-partner.  ?

## 2022-02-07 NOTE — Progress Notes (Signed)
HISTORY OF PRESENT ILLNESS: ?  ?Caleb Gray is a 81 y.o. male with past medical history as listed below.  He has a history of colon cancer diagnosed in 77 in [place] status post surgical resection (T3, N1).  He also has a history of sessile serrated and adenomatous colon polyps.  Most recent examination is 2009, 2014, 2019 (4 polyps).  Follow-up in 3 years recommended.  He presents today regarding surveillance.  Patient is pleased to report that he continues to enjoy good health.  He remains active.  His GI review of systems is negative.  Review of outside blood work from August 2022 reveals a normal CBC with hemoglobin 14.3.  Unremarkable comprehensive metabolic panel.  Creatinine 1.53. ?  ?REVIEW OF SYSTEMS: ?  ?All non-GI ROS negative unless otherwise stated in the HPI except for arthritis, hearing problems ?  ?    ?Past Medical History:  ?Diagnosis Date  ? ANEMIA-NOS    ? Arthritis    ?  back  ? ASTHMA    ? Blood transfusion without reported diagnosis    ?  12 years ago when had surgery for kidney mass  ? Cataract    ? Chronic diastolic heart failure ([place])    ? Colon cancer ([place])    ?  Adenoca 1997 stage III T3, N1 s/p rectosigmoid resection  ? CORONARY ARTERY DISEASE    ? HYPERLIPIDEMIA    ? HYPERTENSION    ? Low back pain 10/2009  ?  s/p laminectomy/decompression L4-5  ? Myocardial infarction [facility])    ? Right kidney mass 02/2007  ?  benign (oncocytoma) s/p resection/total nephrectomy  ? UNSPECIFIED PERIPHERAL VASCULAR DISEASE    ?  ?  ?     ?Past Surgical History:  ?Procedure Laterality Date  ? COLONOSCOPY      ? LUMBAR LAMINECTOMY/DECOMPRESSION MICRODISCECTOMY   10/2009  ? NEPHRECTOMY   02/2007  ?  Right  ? POLYPECTOMY      ? Rectosigmoidectomy   1997  ?  ?  ?Social History ?Jenne Pane Remer  reports that he quit smoking about 53 years ago. His smoking use included cigarettes. He has never used smokeless tobacco. He reports current alcohol use. He reports that he does not use drugs. ?  ?family history  includes Alcohol abuse in an other family member; Colon cancer in his mother; Diabetes in his mother; Heart disease in his father. ?  ?     ?Allergies  ?Allergen Reactions  ? Penicillins    ?    Very dry and itchy skin, make throat "feel funny:  ?  ?  ?  ?  ?PHYSICAL EXAMINATION: ?Vital signs: BP (!) 142/80   Pulse 82   Ht '5\' 8"'$  (1.727 m)   Wt 188 lb 4 oz (85.4 kg)   SpO2 97%   BMI 28.62 kg/m?   ?Constitutional: generally well-appearing, no acute distress ?Psychiatric: alert and oriented x3, cooperative ?Eyes: extraocular movements intact, anicteric, conjunctiva pink ?Mouth: oral pharynx moist, no lesions ?Neck: supple no lymphadenopathy ?Cardiovascular: heart regular rate and rhythm, no murmur ?Lungs: clear to auscultation bilaterally ?Abdomen: soft, nontender, nondistended, no obvious ascites, no peritoneal signs, normal bowel sounds, no organomegaly ?Rectal: Deferred until colonoscopy ?Extremities: no clubbing, cyanosis, or lower extremity edema bilaterally ?Skin: no lesions on visible extremities ?Neuro: No focal deficits.  Cranial nerves intact.  Hearing aid ?  ?ASSESSMENT: ?  ?1.  Personal history of colon cancer ?2.  Personal history of sessile serrated and  adenomatous colon polyps. ?3.  Overall health good ?  ?  ?PLAN: ?  ?1.  Surveillance colonoscopy.  The patient is motivated and interested.  He is an appropriate candidate without contraindicationThe nature of the procedure, as well as the risks, benefits, and alternatives were carefully and thoroughly reviewed with the patient. Ample time for discussion and questions allowed. The patient understood, was satisfied, and agreed to proceed.  ?

## 2022-02-11 ENCOUNTER — Telehealth: Payer: Self-pay | Admitting: *Deleted

## 2022-02-11 NOTE — Telephone Encounter (Signed)
?  Follow up Call- ? ? ?  02/07/2022  ? 10:08 AM  ?Call back number  ?Post procedure Call Back phone  # [phone]  ?Permission to leave phone message Yes  ?  ? ?Patient questions: ? ?Do you have a fever, pain , or abdominal swelling? No. ?Pain Score  0 * ? ?Have you tolerated food without any problems? Yes.   ? ?Have you been able to return to your normal activities? Yes.   ? ?Do you have any questions about your discharge instructions: ?Diet   No. ?Medications  No. ?Follow up visit  No. ? ?Do you have questions or concerns about your Care? No. ? ?Actions: ?* If pain score is 4 or above: ?No action needed, pain <4. ? ? ?

## 2022-05-02 DIAGNOSIS — D2260 Melanocytic nevi of unspecified upper limb, including shoulder: Secondary | ICD-10-CM | POA: Diagnosis not present

## 2022-05-02 DIAGNOSIS — L821 Other seborrheic keratosis: Secondary | ICD-10-CM | POA: Diagnosis not present

## 2022-05-02 DIAGNOSIS — Z85828 Personal history of other malignant neoplasm of skin: Secondary | ICD-10-CM | POA: Diagnosis not present

## 2022-05-02 DIAGNOSIS — L578 Other skin changes due to chronic exposure to nonionizing radiation: Secondary | ICD-10-CM | POA: Diagnosis not present

## 2022-05-02 DIAGNOSIS — C44519 Basal cell carcinoma of skin of other part of trunk: Secondary | ICD-10-CM | POA: Diagnosis not present

## 2022-05-02 DIAGNOSIS — D294 Benign neoplasm of scrotum: Secondary | ICD-10-CM | POA: Diagnosis not present

## 2022-05-02 DIAGNOSIS — D1801 Hemangioma of skin and subcutaneous tissue: Secondary | ICD-10-CM | POA: Diagnosis not present

## 2022-05-02 DIAGNOSIS — Z1283 Encounter for screening for malignant neoplasm of skin: Secondary | ICD-10-CM | POA: Diagnosis not present

## 2022-05-02 DIAGNOSIS — L814 Other melanin hyperpigmentation: Secondary | ICD-10-CM | POA: Diagnosis not present

## 2022-05-02 DIAGNOSIS — D2270 Melanocytic nevi of unspecified lower limb, including hip: Secondary | ICD-10-CM | POA: Diagnosis not present

## 2022-05-02 DIAGNOSIS — D225 Melanocytic nevi of trunk: Secondary | ICD-10-CM | POA: Diagnosis not present

## 2022-05-02 DIAGNOSIS — D485 Neoplasm of uncertain behavior of skin: Secondary | ICD-10-CM | POA: Diagnosis not present

## 2022-06-18 ENCOUNTER — Encounter: Payer: Self-pay | Admitting: Internal Medicine

## 2022-06-18 ENCOUNTER — Ambulatory Visit (INDEPENDENT_AMBULATORY_CARE_PROVIDER_SITE_OTHER): Payer: Medicare HMO | Admitting: Internal Medicine

## 2022-06-18 VITALS — BP 122/74 | HR 84 | Temp 97.9°F | Ht 68.0 in | Wt 181.0 lb

## 2022-06-18 DIAGNOSIS — F39 Unspecified mood [affective] disorder: Secondary | ICD-10-CM

## 2022-06-18 DIAGNOSIS — M159 Polyosteoarthritis, unspecified: Secondary | ICD-10-CM | POA: Diagnosis not present

## 2022-06-18 DIAGNOSIS — R739 Hyperglycemia, unspecified: Secondary | ICD-10-CM

## 2022-06-18 DIAGNOSIS — N1832 Chronic kidney disease, stage 3b: Secondary | ICD-10-CM | POA: Diagnosis not present

## 2022-06-18 DIAGNOSIS — R7303 Prediabetes: Secondary | ICD-10-CM | POA: Insufficient documentation

## 2022-06-18 DIAGNOSIS — J4520 Mild intermittent asthma, uncomplicated: Secondary | ICD-10-CM

## 2022-06-18 DIAGNOSIS — Z0000 Encounter for general adult medical examination without abnormal findings: Secondary | ICD-10-CM | POA: Diagnosis not present

## 2022-06-18 DIAGNOSIS — Z136 Encounter for screening for cardiovascular disorders: Secondary | ICD-10-CM | POA: Diagnosis not present

## 2022-06-18 DIAGNOSIS — I2510 Atherosclerotic heart disease of native coronary artery without angina pectoris: Secondary | ICD-10-CM | POA: Diagnosis not present

## 2022-06-18 DIAGNOSIS — I10 Essential (primary) hypertension: Secondary | ICD-10-CM

## 2022-06-18 DIAGNOSIS — R69 Illness, unspecified: Secondary | ICD-10-CM | POA: Diagnosis not present

## 2022-06-18 LAB — CBC
HCT: 43 % (ref 39.0–52.0)
Hemoglobin: 14.8 g/dL (ref 13.0–17.0)
MCHC: 34.5 g/dL (ref 30.0–36.0)
MCV: 94.6 fl (ref 78.0–100.0)
Platelets: 230 10*3/uL (ref 150.0–400.0)
RBC: 4.55 Mil/uL (ref 4.22–5.81)
RDW: 13.3 % (ref 11.5–15.5)
WBC: 6 10*3/uL (ref 4.0–10.5)

## 2022-06-18 LAB — LIPID PANEL
Cholesterol: 121 mg/dL (ref 0–200)
HDL: 46.4 mg/dL (ref >39.00)
LDL Cholesterol: 60 mg/dL (ref 0–99)
NonHDL: 74.59
Total CHOL/HDL Ratio: 3
Triglycerides: 73 mg/dL (ref 0.0–149.0)
VLDL: 14.6 mg/dL (ref 0.0–40.0)

## 2022-06-18 LAB — HEPATIC FUNCTION PANEL
ALT: 28 U/L (ref 0–53)
AST: 25 U/L (ref 0–37)
Albumin: 4.3 g/dL (ref 3.5–5.2)
Alkaline Phosphatase: 83 U/L (ref 39–117)
Bilirubin, Direct: 0.2 mg/dL (ref 0.0–0.3)
Total Bilirubin: 1 mg/dL (ref 0.2–1.2)
Total Protein: 6.7 g/dL (ref 6.0–8.3)

## 2022-06-18 LAB — RENAL FUNCTION PANEL
Albumin: 4.3 g/dL (ref 3.5–5.2)
BUN: 25 mg/dL — ABNORMAL HIGH (ref 6–23)
CO2: 28 mEq/L (ref 19–32)
Calcium: 9.8 mg/dL (ref 8.4–10.5)
Chloride: 102 mEq/L (ref 96–112)
Creatinine, Ser: 1.59 mg/dL — ABNORMAL HIGH (ref 0.40–1.50)
GFR: 40.62 mL/min — ABNORMAL LOW (ref >60.00)
Glucose, Bld: 128 mg/dL — ABNORMAL HIGH (ref 70–99)
Phosphorus: 3.1 mg/dL (ref 2.3–4.6)
Potassium: 3.6 mEq/L (ref 3.5–5.1)
Sodium: 140 mEq/L (ref 135–145)

## 2022-06-18 LAB — HEMOGLOBIN A1C: Hgb A1c MFr Bld: 6.4 % (ref 4.6–6.5)

## 2022-06-18 LAB — VITAMIN D 25 HYDROXY (VIT D DEFICIENCY, FRACTURES): VITD: 29.78 ng/mL — ABNORMAL LOW (ref 30.00–100.00)

## 2022-06-18 MED ORDER — ENALAPRIL MALEATE 20 MG PO TABS: 20.0000 mg | ORAL_TABLET | Freq: Every day | ORAL | 3 refills | Status: DC

## 2022-06-18 NOTE — Progress Notes (Signed)
Hearing Screening - Comments:: Has hearing aids. Wearing them today.  Vision Screening - Comments:: March 2023  

## 2022-06-18 NOTE — Assessment & Plan Note (Signed)
BP Readings from Last 3 Encounters:  06/18/22 122/74  02/07/22 137/76  12/13/21 (!) 142/80   Good control on amlodipine 10, HCTZ 12.5 and the enalapril

## 2022-06-18 NOTE — Assessment & Plan Note (Signed)
Relatively stable Will check labs On enalapril

## 2022-06-18 NOTE — Assessment & Plan Note (Signed)
Quiet  Hasn't needed the albuterol in past year

## 2022-06-18 NOTE — Patient Instructions (Signed)
You can try 2 extra strength tylenol (up to three times a day) and over the counter topical diclofenac for your hip pain.

## 2022-06-18 NOTE — Assessment & Plan Note (Signed)
Back and right hip Discussed tylenol and topical diclofenac (for hip)

## 2022-06-18 NOTE — Assessment & Plan Note (Signed)
Still grieves wife Transient depressed mood but no need for meds

## 2022-06-18 NOTE — Assessment & Plan Note (Signed)
I have personally reviewed the Medicare Annual Wellness questionnaire and have noted 1. The patient's medical and social history 2. Their use of alcohol, tobacco or illicit drugs 3. Their current medications and supplements 4. The patient's functional ability including ADL's, fall risks, home safety risks and hearing or visual             impairment. 5. Diet and physical activities 6. Evidence for depression or mood disorders  The patients weight, height, BMI and visual acuity have been recorded in the chart I have made referrals, counseling and provided education to the patient based review of the above and I have provided the pt with a written personalized care plan for preventive services.  I have provided you with a copy of your personalized plan for preventive services. Please take the time to review along with your updated medication list.  Done with cancer screening Does exercise and stay active Needs updated COVID and flu vaccines in the fall Td at pharmacy Had shingles vaccine already

## 2022-06-18 NOTE — Progress Notes (Signed)
Subjective:    Patient ID: Caleb Gray, male    DOB: 1941-08-24, 81 y.o.   MRN: [id number]  HPI Here for Medicare wellness visit and follow up of chronic health conditions Reviewed advanced directives Reviewed other doctors----Dr Maran--derm, Dr Skains--cardiology, Dr Kathrine Cords, Dr Vicente Serene, getting new eye doctor [facility]?) No hospitalizations or surgery in the past year Vision is fine Hearing is good---with aides Occasional wine or beer No tobacco Still goes to the gym 1-2 times a week. He stays active with shopping and housework. Doesn't do the yard work No falls Mild mood issues--mostly better Independent with instrumental ADLs No memory problems  Generally doing okay Reviewed the lower GFR-- from 46-42 Only has one kidney  Not playing golf lately---just the driving range at times Limited by joint pains  No chest pain No palpitations No dizziness or syncope No SOB No edema  Still grieves wife--but "fading" Only occasional depression No anhedonia Enjoys time with his grandkids, etc  No wheezing or cough Hasn't needed the albuterol in quite some time  Current Outpatient Medications on File Prior to Visit  Medication Sig Dispense Refill   amLODipine (NORVASC) 10 MG tablet Take 1 tablet (10 mg total) by mouth daily. 90 tablet 3   aspirin 81 MG tablet Take 81 mg by mouth daily.       atorvastatin (LIPITOR) 20 MG tablet Take 1 tablet (20 mg total) by mouth at bedtime. 90 tablet 3   enalapril (VASOTEC) 20 MG tablet Take 1 tablet (20 mg total) by mouth daily. 90 tablet 1   hydrochlorothiazide (MICROZIDE) 12.5 MG capsule TAKE 1 CAPSULE BY MOUTH EVERY DAY IN THE MORNING 90 capsule 3   Multiple Vitamins-Minerals (CENTRUM SILVER 50+MEN PO) Take by mouth.     Omega-3 Fatty Acids (FISH OIL) 1000 MG CAPS Take by mouth 2 (two) times daily.      albuterol (VENTOLIN HFA) 108 (90 Base) MCG/ACT inhaler Inhale 2 puffs into the lungs every 4 (four) hours as needed for  wheezing or shortness of breath. (Patient not taking: Reported on 06/18/2022) 8 g 3   No current facility-administered medications on file prior to visit.    Allergies  Allergen Reactions   Penicillins     Very dry and itchy skin, make throat "feel funny:    Past Medical History:  Diagnosis Date   ANEMIA-NOS    Arthritis    back   ASTHMA    Blood transfusion without reported diagnosis    12 years ago when had surgery for kidney mass   Cataract    Chronic diastolic heart failure ([facility])    Colon cancer ([place])    Adenoca 1997 stage III T3, N1 s/p rectosigmoid resection   CORONARY ARTERY DISEASE    HYPERLIPIDEMIA    HYPERTENSION    Low back pain 10/2009   s/p laminectomy/decompression L4-5   Myocardial infarction [facility])    Right kidney mass 02/2007   benign (oncocytoma) s/p resection/total nephrectomy   UNSPECIFIED PERIPHERAL VASCULAR DISEASE     Past Surgical History:  Procedure Laterality Date   COLONOSCOPY     LUMBAR LAMINECTOMY/DECOMPRESSION MICRODISCECTOMY  10/2009   NEPHRECTOMY  02/2007   Right   POLYPECTOMY     Rectosigmoidectomy  1997    Family History  Problem Relation Age of Onset   Diabetes Mother    Colon cancer Mother    Heart disease Father    Alcohol abuse Other        Parents   Esophageal  cancer Neg Hx    Rectal cancer Neg Hx    Stomach cancer Neg Hx    Pancreatic cancer Neg Hx     Social History   Socioeconomic History   Marital status: Widowed    Spouse name: Not on file   Number of children: 2   Years of education: Not on file   Highest education level: Not on file  Occupational History   Occupation: IT--systems     Comment: retired  Tobacco Use   Smoking status: Former    Types: Cigarettes    Quit date: 11/11/1968    Years since quitting: 53.6    Passive exposure: Never   Smokeless tobacco: Never  Vaping Use   Vaping Use: Never used  Substance and Sexual Activity   Alcohol use: Yes    Alcohol/week: 0.0 standard drinks of alcohol     Comment: occasionally   Drug use: Never   Sexual activity: Not on file  Other Topics Concern   Not on file  Social History Narrative   Moved from [place] 2/20      Has living will    Daughter is health care POA   Would accept resuscitation   No prolonged machines or feeding tube   Social Determinants of Health   Financial Resource Strain: Not on file  Food Insecurity: Not on file  Transportation Needs: Not on file  Physical Activity: Not on file  Stress: Not on file  Social Connections: Not on file  Intimate Partner Violence: Not on file   Review of Systems Appetite is okay Weight down 10# since last year Sleeps well Wears seat belt Teeth okay---keeps up with dentist (needs new bridge) Recent colonoscopy fine--done with screening Has 2 basal cell carcinomas due for removal Ongoing back and right hip pain---no meds No skin lesions to check today No heartburn or dysphagia Bowels are okay     Objective:   Physical Exam Constitutional:      Appearance: Normal appearance.  HENT:     Mouth/Throat:     Comments: No lesions Eyes:     Conjunctiva/sclera: Conjunctivae normal.     Pupils: Pupils are equal, round, and reactive to light.  Cardiovascular:     Rate and Rhythm: Normal rate and regular rhythm.     Pulses: Normal pulses.     Heart sounds: No murmur heard.    No gallop.     Comments: Frequent skips Pulmonary:     Effort: Pulmonary effort is normal.     Breath sounds: Normal breath sounds. No wheezing or rales.  Abdominal:     Palpations: Abdomen is soft.     Tenderness: There is no abdominal tenderness.  Musculoskeletal:     Cervical back: Neck supple.     Right lower leg: No edema.     Left lower leg: No edema.  Lymphadenopathy:     Cervical: No cervical adenopathy.  Skin:    Findings: No lesion or rash.  Neurological:     General: No focal deficit present.     Mental Status: He is alert and oriented to person, place, and time.      Comments: Mini-cog--normal  Psychiatric:        Mood and Affect: Mood normal.        Behavior: Behavior normal.            Assessment & Plan:

## 2022-06-18 NOTE — Assessment & Plan Note (Signed)
Will check A1c

## 2022-06-18 NOTE — Assessment & Plan Note (Signed)
No sig symptoms On ASA 81, atorvastatin 20, enalapril 20 daily

## 2022-06-19 LAB — PARATHYROID HORMONE, INTACT (NO CA): PTH: 32 pg/mL (ref 16–77)

## 2022-06-24 ENCOUNTER — Telehealth: Payer: Self-pay | Admitting: Internal Medicine

## 2022-06-24 NOTE — Telephone Encounter (Signed)
Patient called in and would like to know should he take the additional vitamin D that Dr Silvio Pate  advised him to take ,because he's already getting vitamin D in the [employer] [facility] that he takes daily? He was asking about the dosage as well. He would like a phone call with advice.

## 2022-06-26 NOTE — Telephone Encounter (Signed)
Left message on VM per DPR with Dr Alla German recommendation.

## 2022-08-29 DIAGNOSIS — C44519 Basal cell carcinoma of skin of other part of trunk: Secondary | ICD-10-CM | POA: Diagnosis not present

## 2022-09-29 ENCOUNTER — Other Ambulatory Visit: Payer: Self-pay | Admitting: Internal Medicine

## 2022-12-02 ENCOUNTER — Other Ambulatory Visit: Payer: Self-pay | Admitting: Internal Medicine

## 2023-01-13 DIAGNOSIS — H26493 Other secondary cataract, bilateral: Secondary | ICD-10-CM | POA: Diagnosis not present

## 2023-01-20 DIAGNOSIS — H524 Presbyopia: Secondary | ICD-10-CM | POA: Diagnosis not present

## 2023-02-13 NOTE — Progress Notes (Signed)
Cardiology Office Note:    Date:  02/14/2023   ID:  Caleb Gray, DOB [birth year], MRN [id number]  PCP:  Karie SchwalbeLetvak, Richard I, MD   [facility] Providers Cardiologist:  Donato SchultzMark , MD    Referring MD: Karie SchwalbeLetvak, Richard I, MD     History of Present Illness:    Caleb Gray is a 82 y.o. male here for follow-up of coronary artery disease.  medical management for cardiac catheterization demonstrated 70% mid LAD, 90% distal RCA given lack of symptoms. Low risk Myoview in 2009. Walks golfs. Echocardiogram with normal ejection fraction, normal RV. Complaint of low back pain has been chronic.   He also has a history of colon cancer, hypertension, right nephrectomy in 2008.   Wife died pancreatic cancer for 5 years. In 2//26/2020 died. Hiatal hernia. Surgery 17 days in hospital. 10 days later at home died. Acute resp syndrome. He was wondering if could have been associated with COVID-19.   Had some left hearing loss, possible neuritis, took prednisone  BP 131/75 - 115/70 at home.   Overall doing quite well.  Denies any anginal symptoms.  Has chronic low back pain.  No shortness of breath no syncope no fevers.  Tolerating his medications well.  We discussed his prior cardiac catheterization.  Had kidney removed from renal mass in 2008 surrounding cardiac catheterization as preoperative workup.  Past Medical History:  Diagnosis Date   ANEMIA-NOS    Arthritis    back   ASTHMA    Blood transfusion without reported diagnosis    12 years ago when had surgery for kidney mass   Cataract    Chronic diastolic heart failure    Colon cancer    [place] 1997 stage III T3, N1 s/p rectosigmoid resection   CORONARY ARTERY DISEASE    HYPERLIPIDEMIA    HYPERTENSION    Low back pain 10/2009   s/p laminectomy/decompression L4-5   Myocardial infarction    Right kidney mass 02/2007   benign (oncocytoma) s/p resection/total nephrectomy   UNSPECIFIED PERIPHERAL VASCULAR DISEASE     Past  Surgical History:  Procedure Laterality Date   COLONOSCOPY     LUMBAR LAMINECTOMY/DECOMPRESSION MICRODISCECTOMY  10/2009   NEPHRECTOMY  02/2007   Right   POLYPECTOMY     Rectosigmoidectomy  1997    Current Medications: Current Meds  Medication Sig   amLODipine (NORVASC) 10 MG tablet TAKE 1 TABLET BY MOUTH EVERY DAY   aspirin 81 MG tablet Take 81 mg by mouth daily.     atorvastatin (LIPITOR) 20 MG tablet Take 1 tablet (20 mg total) by mouth at bedtime.   enalapril (VASOTEC) 20 MG tablet Take 1 tablet (20 mg total) by mouth daily.   hydrochlorothiazide (MICROZIDE) 12.5 MG capsule TAKE 1 CAPSULE BY MOUTH EVERY DAY IN THE MORNING   Multiple Vitamins-Minerals (CENTRUM SILVER 50+MEN PO) Take by mouth.   Omega-3 Fatty Acids (FISH OIL) 1000 MG CAPS Take by mouth 2 (two) times daily.      Allergies:   Penicillins   Social History   Socioeconomic History   Marital status: Widowed    Spouse name: Not on file   Number of children: 2   Years of education: Not on file   Highest education level: Not on file  Occupational History   Occupation: IT--systems     Comment: retired  Tobacco Use   Smoking status: Former    Types: Cigarettes    Quit date: 11/11/1968    Years since quitting:  54.2    Passive exposure: Never   Smokeless tobacco: Never  Vaping Use   Vaping Use: Never used  Substance and Sexual Activity   Alcohol use: Yes    Alcohol/week: 0.0 standard drinks of alcohol    Comment: occasionally   Drug use: Never   Sexual activity: Not on file  Other Topics Concern   Not on file  Social History Narrative   Moved from [place]   Widowed 2/20      Has living will    Daughter is health care POA   Would accept resuscitation   No prolonged machines or feeding tube   Social Determinants of Health   Financial Resource Strain: Not on file  Food Insecurity: Not on file  Transportation Needs: Not on file  Physical Activity: Not on file  Stress: Not on file  Social  Connections: Not on file     Family History: The patient's family history includes Alcohol abuse in an other family member; Colon cancer in his mother; Diabetes in his mother; Heart disease in his father. There is no history of Esophageal cancer, Rectal cancer, Stomach cancer, or Pancreatic cancer.  ROS:   Please see the history of present illness.     All other systems reviewed and are negative.  EKGs/Labs/Other Studies Reviewed:    The following studies were reviewed today: ECHO 06/2014  - EF 60-65%, mild LVH, normal RV size and systolic function.      ETT-myoview 8/09  - fixed deficit at basal inferior segment (small), low risk.    Cath 02/2007 in [place]:  - 70% mid LAD, 90% dist RCA managed medically. No PCI    EKG:   02/14/2023-sinus rhythm 80 PVCs noted nonspecific ST-T wave changes. Prior sinus rhythm 79 no other abnormalities.  Recent Labs: 06/18/2022: ALT 28; BUN 25; Creatinine, Ser 1.59; Hemoglobin 14.8; Platelets 230.0; Potassium 3.6; Sodium 140  Recent Lipid Panel    Component Value Date/Time   CHOL 121 06/18/2022 1017   TRIG 73.0 06/18/2022 1017   TRIG 79 03/08/2009 0000   HDL 46.40 06/18/2022 1017   CHOLHDL 3 06/18/2022 1017   VLDL 14.6 06/18/2022 1017   LDLCALC 60 06/18/2022 1017   LDLCALC 62 03/08/2009 0000   LDLDIRECT 159.7 11/28/2009 0918     Risk Assessment/Calculations:          Physical Exam:    VS:  BP 138/65 (BP Location: Left Arm, Patient Position: Sitting, Cuff Size: Normal)   Pulse 80   Ht  (1.727 m)   Wt 184 lb (83.5 kg)   BMI 27.98 kg/m     Wt Readings from Last 3 Encounters:  02/14/23 184 lb (83.5 kg)  06/18/22 181 lb (82.1 kg)  02/07/22 191 lb (86.6 kg)     GEN: Well nourished, well developed, in no acute distress HEENT: normal Neck: no JVD, carotid bruits, or masses Cardiac: RRR; no murmurs, rubs, or gallops,no edema  Respiratory:  clear to auscultation bilaterally, normal work of breathing GI: soft, nontender,  nondistended, + BS MS: no deformity or atrophy Skin: warm and dry, no rash Neuro:  Alert and Oriented x 3, Strength and sensation are intact Psych: euthymic mood, full affect   ASSESSMENT:    1. Atherosclerosis of native coronary artery of native heart without angina pectoris   2. Essential (primary) hypertension   3. Stage 3a chronic kidney disease   4. Bilateral low back pain without sciatica, unspecified chronicity     PLAN:  In order of problems listed above:  CAD in native artery Currently doing well without any chest pain no anginal symptoms.  Back in 2015 echocardiogram showed normal ejection fraction.  Continue with his low-dose aspirin, no bleeding, statin on atorvastatin 20 mg, enalapril.  Prior cardiac catheterization reviewed as above.  Explained to him the nature of his coronary artery disease.  If symptoms change, we can always pursue further testing.  He is also taking fish oil.  This is reasonable as long as he is not having any bleeding issues.  Medical management has served him well since 2008.  Essential (primary) hypertension Continue with current blood pressure control.  Here in the office it is often high but at home it is normal.  Overall doing very well, continue with current medication management which includes amlodipine and enalapril hydrochlorothiazide.  Stage 3a chronic kidney disease (HCC) Currently on enalapril, ACE inhibitor.  No changes made. No NSAID. Has single kidney. Back pain. Tylenol 2 tabs a day.   Low back pain Currently working out at the gym.  Helping with his back pain.  Continue with exercise.  Hyperlipidemia LDL 60 2023.  Excellent.  At goal.  Continue with statin.   Medication Adjustments/Labs and Tests Ordered: Current medicines are reviewed at length with the patient today.  Concerns regarding medicines are outlined above.  Orders Placed This Encounter  Procedures   EKG 12-Lead   No orders of the defined types were placed in  this encounter.    Patient Instructions  Medication Instructions:  The current medical regimen is effective;  continue present plan and medications.  *If you need a refill on your cardiac medications before your next appointment, please call your pharmacy*  Follow-Up: At [facility], you and your health needs are our priority.  As part of our continuing mission to provide you with exceptional heart care, we have created designated Provider Care Teams.  These Care Teams include your primary Cardiologist (physician) and Advanced Practice Providers (APPs -  Physician Assistants and Nurse Practitioners) who all work together to provide you with the care you need, when you need it.  We recommend signing up for the patient portal called "MyChart".  Sign up information is provided on this After Visit Summary.  MyChart is used to connect with patients for Virtual Visits (Telemedicine).  Patients are able to view lab/test results, encounter notes, upcoming appointments, etc.  Non-urgent messages can be sent to your provider as well.   To learn more about what you can do with MyChart, go to [email or web address].    Your next appointment:   1 year(s)  Provider:   Donato Schultz, MD        Signed, Donato Schultz, MD  02/14/2023 10:06 AM    [facility]

## 2023-02-14 ENCOUNTER — Encounter: Payer: Self-pay | Admitting: Cardiology

## 2023-02-14 ENCOUNTER — Ambulatory Visit: Payer: Medicare HMO | Attending: Cardiology | Admitting: Cardiology

## 2023-02-14 VITALS — BP 138/65 | HR 80 | Ht 68.0 in | Wt 184.0 lb

## 2023-02-14 DIAGNOSIS — M545 Low back pain, unspecified: Secondary | ICD-10-CM

## 2023-02-14 DIAGNOSIS — I2510 Atherosclerotic heart disease of native coronary artery without angina pectoris: Secondary | ICD-10-CM | POA: Diagnosis not present

## 2023-02-14 DIAGNOSIS — N1831 Chronic kidney disease, stage 3a: Secondary | ICD-10-CM | POA: Diagnosis not present

## 2023-02-14 DIAGNOSIS — I10 Essential (primary) hypertension: Secondary | ICD-10-CM

## 2023-02-14 NOTE — Patient Instructions (Signed)
Medication Instructions:  The current medical regimen is effective;  continue present plan and medications.  *If you need a refill on your cardiac medications before your next appointment, please call your pharmacy*  Follow-Up: At [facility], you and your health needs are our priority.  As part of our continuing mission to provide you with exceptional heart care, we have created designated Provider Care Teams.  These Care Teams include your primary Cardiologist (physician) and Advanced Practice Providers (APPs -  Physician Assistants and Nurse Practitioners) who all work together to provide you with the care you need, when you need it.  We recommend signing up for the patient portal called "MyChart".  Sign up information is provided on this After Visit Summary.  MyChart is used to connect with patients for Virtual Visits (Telemedicine).  Patients are able to view lab/test results, encounter notes, upcoming appointments, etc.  Non-urgent messages can be sent to your provider as well.   To learn more about what you can do with MyChart, go to [email or web address].    Your next appointment:   1 year(s)  Provider:   Mark Skains, MD      

## 2023-02-24 ENCOUNTER — Other Ambulatory Visit: Payer: Self-pay | Admitting: Internal Medicine

## 2023-04-09 DIAGNOSIS — I739 Peripheral vascular disease, unspecified: Secondary | ICD-10-CM | POA: Diagnosis not present

## 2023-04-09 DIAGNOSIS — Z8673 Personal history of transient ischemic attack (TIA), and cerebral infarction without residual deficits: Secondary | ICD-10-CM | POA: Diagnosis not present

## 2023-04-09 DIAGNOSIS — I130 Hypertensive heart and chronic kidney disease with heart failure and stage 1 through stage 4 chronic kidney disease, or unspecified chronic kidney disease: Secondary | ICD-10-CM | POA: Diagnosis not present

## 2023-04-09 DIAGNOSIS — R739 Hyperglycemia, unspecified: Secondary | ICD-10-CM | POA: Diagnosis not present

## 2023-04-09 DIAGNOSIS — I2510 Atherosclerotic heart disease of native coronary artery without angina pectoris: Secondary | ICD-10-CM | POA: Diagnosis not present

## 2023-04-09 DIAGNOSIS — E785 Hyperlipidemia, unspecified: Secondary | ICD-10-CM | POA: Diagnosis not present

## 2023-04-09 DIAGNOSIS — I509 Heart failure, unspecified: Secondary | ICD-10-CM | POA: Diagnosis not present

## 2023-04-09 DIAGNOSIS — M1990 Unspecified osteoarthritis, unspecified site: Secondary | ICD-10-CM | POA: Diagnosis not present

## 2023-04-09 DIAGNOSIS — Z87892 Personal history of anaphylaxis: Secondary | ICD-10-CM | POA: Diagnosis not present

## 2023-04-09 DIAGNOSIS — Z87891 Personal history of nicotine dependence: Secondary | ICD-10-CM | POA: Diagnosis not present

## 2023-04-09 DIAGNOSIS — Z880 Allergy status to penicillin: Secondary | ICD-10-CM | POA: Diagnosis not present

## 2023-04-09 DIAGNOSIS — J45909 Unspecified asthma, uncomplicated: Secondary | ICD-10-CM | POA: Diagnosis not present

## 2023-04-14 ENCOUNTER — Other Ambulatory Visit: Payer: Self-pay | Admitting: Internal Medicine

## 2023-05-05 DIAGNOSIS — L814 Other melanin hyperpigmentation: Secondary | ICD-10-CM | POA: Diagnosis not present

## 2023-05-05 DIAGNOSIS — D239 Other benign neoplasm of skin, unspecified: Secondary | ICD-10-CM | POA: Diagnosis not present

## 2023-05-05 DIAGNOSIS — L821 Other seborrheic keratosis: Secondary | ICD-10-CM | POA: Diagnosis not present

## 2023-05-05 DIAGNOSIS — Z85828 Personal history of other malignant neoplasm of skin: Secondary | ICD-10-CM | POA: Diagnosis not present

## 2023-05-05 DIAGNOSIS — Z1283 Encounter for screening for malignant neoplasm of skin: Secondary | ICD-10-CM | POA: Diagnosis not present

## 2023-05-05 DIAGNOSIS — L820 Inflamed seborrheic keratosis: Secondary | ICD-10-CM | POA: Diagnosis not present

## 2023-05-05 DIAGNOSIS — D225 Melanocytic nevi of trunk: Secondary | ICD-10-CM | POA: Diagnosis not present

## 2023-05-05 DIAGNOSIS — D2260 Melanocytic nevi of unspecified upper limb, including shoulder: Secondary | ICD-10-CM | POA: Diagnosis not present

## 2023-05-05 DIAGNOSIS — D2270 Melanocytic nevi of unspecified lower limb, including hip: Secondary | ICD-10-CM | POA: Diagnosis not present

## 2023-05-05 DIAGNOSIS — L578 Other skin changes due to chronic exposure to nonionizing radiation: Secondary | ICD-10-CM | POA: Diagnosis not present

## 2023-06-20 ENCOUNTER — Encounter: Payer: Medicare HMO | Admitting: Internal Medicine

## 2023-06-23 ENCOUNTER — Ambulatory Visit (INDEPENDENT_AMBULATORY_CARE_PROVIDER_SITE_OTHER): Payer: Medicare HMO | Admitting: Internal Medicine

## 2023-06-23 ENCOUNTER — Encounter: Payer: Self-pay | Admitting: Internal Medicine

## 2023-06-23 VITALS — BP 130/64 | HR 57 | Temp 98.0°F | Ht 68.0 in | Wt 180.0 lb

## 2023-06-23 DIAGNOSIS — F39 Unspecified mood [affective] disorder: Secondary | ICD-10-CM | POA: Diagnosis not present

## 2023-06-23 DIAGNOSIS — I2510 Atherosclerotic heart disease of native coronary artery without angina pectoris: Secondary | ICD-10-CM | POA: Diagnosis not present

## 2023-06-23 DIAGNOSIS — M159 Polyosteoarthritis, unspecified: Secondary | ICD-10-CM | POA: Diagnosis not present

## 2023-06-23 DIAGNOSIS — I10 Essential (primary) hypertension: Secondary | ICD-10-CM | POA: Diagnosis not present

## 2023-06-23 DIAGNOSIS — Z0000 Encounter for general adult medical examination without abnormal findings: Secondary | ICD-10-CM

## 2023-06-23 DIAGNOSIS — R7309 Other abnormal glucose: Secondary | ICD-10-CM

## 2023-06-23 DIAGNOSIS — N1832 Chronic kidney disease, stage 3b: Secondary | ICD-10-CM | POA: Diagnosis not present

## 2023-06-23 LAB — CBC
HCT: 41.6 % (ref 39.0–52.0)
Hemoglobin: 14 g/dL (ref 13.0–17.0)
MCHC: 33.8 g/dL (ref 30.0–36.0)
MCV: 96.2 fl (ref 78.0–100.0)
Platelets: 228 10*3/uL (ref 150.0–400.0)
RBC: 4.32 Mil/uL (ref 4.22–5.81)
RDW: 12.8 % (ref 11.5–15.5)
WBC: 5.6 10*3/uL (ref 4.0–10.5)

## 2023-06-23 LAB — RENAL FUNCTION PANEL
Albumin: 4.2 g/dL (ref 3.5–5.2)
BUN: 23 mg/dL (ref 6–23)
CO2: 29 mEq/L (ref 19–32)
Calcium: 10 mg/dL (ref 8.4–10.5)
Chloride: 99 mEq/L (ref 96–112)
Creatinine, Ser: 1.47 mg/dL (ref 0.40–1.50)
GFR: 44.32 mL/min — ABNORMAL LOW (ref >60.00)
Glucose, Bld: 137 mg/dL — ABNORMAL HIGH (ref 70–99)
Phosphorus: 3.1 mg/dL (ref 2.3–4.6)
Potassium: 3.4 mEq/L — ABNORMAL LOW (ref 3.5–5.1)
Sodium: 137 mEq/L (ref 135–145)

## 2023-06-23 LAB — HEPATIC FUNCTION PANEL
ALT: 29 U/L (ref 0–53)
AST: 29 U/L (ref 0–37)
Albumin: 4.2 g/dL (ref 3.5–5.2)
Alkaline Phosphatase: 78 U/L (ref 39–117)
Bilirubin, Direct: 0.3 mg/dL (ref 0.0–0.3)
Total Bilirubin: 1.2 mg/dL (ref 0.2–1.2)
Total Protein: 6.3 g/dL (ref 6.0–8.3)

## 2023-06-23 LAB — LIPID PANEL
Cholesterol: 115 mg/dL (ref 0–200)
HDL: 42.8 mg/dL (ref >39.00)
LDL Cholesterol: 57 mg/dL (ref 0–99)
NonHDL: 72.18
Total CHOL/HDL Ratio: 3
Triglycerides: 74 mg/dL (ref 0.0–149.0)
VLDL: 14.8 mg/dL (ref 0.0–40.0)

## 2023-06-23 LAB — HEMOGLOBIN A1C: Hgb A1c MFr Bld: 6.1 % (ref 4.6–6.5)

## 2023-06-23 LAB — VITAMIN D 25 HYDROXY (VIT D DEFICIENCY, FRACTURES): VITD: 43.58 ng/mL (ref 30.00–100.00)

## 2023-06-23 LAB — EXTRA SPECIMEN

## 2023-06-23 MED ORDER — ALPRAZOLAM 0.25 MG PO TABS: 0.2500 mg | ORAL_TABLET | Freq: Two times a day (BID) | ORAL | 0 refills | Status: AC | PRN

## 2023-06-23 NOTE — Assessment & Plan Note (Signed)
No angina On statin, ASA, enalapril 20

## 2023-06-23 NOTE — Assessment & Plan Note (Signed)
I have personally reviewed the Medicare Annual Wellness questionnaire and have noted 1. The patient's medical and social history 2. Their use of alcohol, tobacco or illicit drugs 3. Their current medications and supplements 4. The patient's functional ability including ADL's, fall risks, home safety risks and hearing or visual             impairment. 5. Diet and physical activities 6. Evidence for depression or mood disorders  The patients weight, height, BMI and visual acuity have been recorded in the chart I have made referrals, counseling and provided education to the patient based review of the above and I have provided the pt with a written personalized care plan for preventive services.  I have provided you with a copy of your personalized plan for preventive services. Please take the time to review along with your updated medication list.  No cancer screening Updated flu and COVID vaccines in the fall On time RSV vaccine Thinks he had Td Needs to increase exercise

## 2023-06-23 NOTE — Assessment & Plan Note (Signed)
Stable in 40's for some time Will recheck

## 2023-06-23 NOTE — Assessment & Plan Note (Signed)
Mostly anxiety--that is chronic but worse Will Rx alprazolam 0.25 for rare use

## 2023-06-23 NOTE — Assessment & Plan Note (Signed)
Does well with low dose tylenol No NSAIDs due to CKD

## 2023-06-23 NOTE — Progress Notes (Signed)
Subjective:    Patient ID: Caleb Gray, male    DOB: 1941/01/15, 82 y.o.   MRN: [id number]  HPI Here for Medicare wellness visit and follow up of chronic health conditions Reviewed advanced directives Reviewed other doctors---Dr Skains--cardiology, Dr Bonnee Quin, Dr Hildred Priest, Dr Perry--GI, Dr Cena Benton No hospitalizations or surgery this year No regular exercise---sporadically goes to gym--discussed increasing Occasional alcohol--rare No tobacco No falls Vision is fine Hearing aides help Hearing is good Independent with instrumental ADLs No memory issues  Does notice some anxiety--this is nothing new Having more trouble "managing it" Seems to affect him more--harder "to get through it" Stressors---daughter getting divorced, trouble with his son, etc Not really depressed Not as much enjoyment in life--but had recent trip to see son and family (grandkids). Doesn't travel very much  No chest pain or SOB No dizziness or syncope No edema No palpitations No headaches   Last GFR 40 Is on vitamin D Voids okay--slow at times. No sig dribbling  Current Outpatient Medications on File Prior to Visit  Medication Sig Dispense Refill   amLODipine (NORVASC) 10 MG tablet TAKE 1 TABLET BY MOUTH EVERY DAY 90 tablet 3   aspirin 81 MG tablet Take 81 mg by mouth daily.       atorvastatin (LIPITOR) 20 MG tablet TAKE 1 TABLET BY MOUTH EVERYDAY AT BEDTIME 90 tablet 3   cholecalciferol (VITAMIN D3) 25 MCG (1000 UNIT) tablet Take 1,000 Units by mouth daily.     enalapril (VASOTEC) 20 MG tablet TAKE 1 TABLET BY MOUTH EVERY DAY 90 tablet 0   hydrochlorothiazide (MICROZIDE) 12.5 MG capsule TAKE 1 CAPSULE BY MOUTH EVERY DAY IN THE MORNING 90 capsule 3   Multiple Vitamins-Minerals (CENTRUM SILVER 50+MEN PO) Take by mouth.     Omega-3 Fatty Acids (FISH OIL) 1000 MG CAPS Take by mouth 2 (two) times daily.      No current facility-administered medications on file prior to visit.     Allergies  Allergen Reactions   Penicillins     Very dry and itchy skin, make throat "feel funny:    Past Medical History:  Diagnosis Date   ANEMIA-NOS    Arthritis    back   ASTHMA    Blood transfusion without reported diagnosis    12 years ago when had surgery for kidney mass   Cataract    Chronic diastolic heart failure (HCC)    Colon cancer (HCC)    [place] 1997 stage III T3, N1 s/p rectosigmoid resection   CORONARY ARTERY DISEASE    HYPERLIPIDEMIA    HYPERTENSION    Low back pain 10/2009   s/p laminectomy/decompression L4-5   Myocardial infarction [facility])    Right kidney mass 02/2007   benign (oncocytoma) s/p resection/total nephrectomy   UNSPECIFIED PERIPHERAL VASCULAR DISEASE     Past Surgical History:  Procedure Laterality Date   COLONOSCOPY     LUMBAR LAMINECTOMY/DECOMPRESSION MICRODISCECTOMY  10/2009   NEPHRECTOMY  02/2007   Right   POLYPECTOMY     Rectosigmoidectomy  1997    Family History  Problem Relation Age of Onset   Diabetes Mother    Colon cancer Mother    Heart disease Father    Alcohol abuse Other        Parents   Esophageal cancer Neg Hx    Rectal cancer Neg Hx    Stomach cancer Neg Hx    Pancreatic cancer Neg Hx     Social History   Socioeconomic History  Marital status: Widowed    Spouse name: Not on file   Number of children: 2   Years of education: Not on file   Highest education level: Not on file  Occupational History   Occupation: IT--systems     Comment: retired  Tobacco Use   Smoking status: Former    Current packs/day: 0.00    Types: Cigarettes    Quit date: 11/11/1968    Years since quitting: 54.6    Passive exposure: Never   Smokeless tobacco: Never  Vaping Use   Vaping status: Never Used  Substance and Sexual Activity   Alcohol use: Yes    Alcohol/week: 0.0 standard drinks of alcohol    Comment: occasionally   Drug use: Never   Sexual activity: Not on file  Other Topics Concern   Not on file  Social  History Narrative   Moved from [place]   Widowed 2/20      Has living will    Daughter is health care POA   Would accept resuscitation   No prolonged machines or feeding tube   Social Determinants of Health   Financial Resource Strain: Not on file  Food Insecurity: Not on file  Transportation Needs: Not on file  Physical Activity: Not on file  Stress: Not on file  Social Connections: Not on file  Intimate Partner Violence: Not on file   Review of Systems Appetite is good Weight slightly down Sleeps well Wears seat belt Teeth okay---keeps up with dentist Occasional heartburn--no meds. No dysphagia Bowels are okay--no blood Chronic back pain--tylenol helps (usually 2 per day)      Objective:   Physical Exam Constitutional:      Appearance: Normal appearance.  HENT:     Mouth/Throat:     Pharynx: No oropharyngeal exudate or posterior oropharyngeal erythema.  Eyes:     Conjunctiva/sclera: Conjunctivae normal.     Pupils: Pupils are equal, round, and reactive to light.  Cardiovascular:     Rate and Rhythm: Normal rate.     Pulses: Normal pulses.     Heart sounds: No murmur heard.    No gallop.     Comments: Bigeminy and freq other extra beats Pulmonary:     Effort: Pulmonary effort is normal.     Breath sounds: Normal breath sounds. No wheezing or rales.  Abdominal:     Palpations: Abdomen is soft.     Tenderness: There is no abdominal tenderness.  Musculoskeletal:     Cervical back: Neck supple.     Right lower leg: No edema.     Left lower leg: No edema.  Lymphadenopathy:     Cervical: No cervical adenopathy.  Skin:    Findings: No lesion or rash.  Neurological:     General: No focal deficit present.     Mental Status: He is alert and oriented to person, place, and time.     Comments: Word naming--13/1 minute Recall 3/3  Psychiatric:        Mood and Affect: Mood normal.        Behavior: Behavior normal.            Assessment & Plan:

## 2023-06-23 NOTE — Assessment & Plan Note (Signed)
BP Readings from Last 3 Encounters:  06/23/23 130/64  02/14/23 138/65  06/18/22 122/74   Okay on enalapril 20, hydrochlorothiazide 12.5, amlodipine 10mg  daily

## 2023-06-23 NOTE — Progress Notes (Signed)
Hearing Screening - Comments:: Has hearing aids. Wearing them today. Vision Screening - Comments:: March 2024

## 2023-06-25 NOTE — Addendum Note (Signed)
Addended by: Alvina Chou on: 06/25/2023 09:34 AM   Modules accepted: Orders

## 2023-06-26 ENCOUNTER — Other Ambulatory Visit: Payer: Medicare HMO

## 2023-06-26 DIAGNOSIS — I2510 Atherosclerotic heart disease of native coronary artery without angina pectoris: Secondary | ICD-10-CM

## 2023-06-26 DIAGNOSIS — I10 Essential (primary) hypertension: Secondary | ICD-10-CM

## 2023-06-26 DIAGNOSIS — M159 Polyosteoarthritis, unspecified: Secondary | ICD-10-CM

## 2023-06-26 DIAGNOSIS — R7309 Other abnormal glucose: Secondary | ICD-10-CM | POA: Diagnosis not present

## 2023-06-26 DIAGNOSIS — N1832 Chronic kidney disease, stage 3b: Secondary | ICD-10-CM

## 2023-06-26 DIAGNOSIS — F39 Unspecified mood [affective] disorder: Secondary | ICD-10-CM | POA: Diagnosis not present

## 2023-06-26 DIAGNOSIS — Z0000 Encounter for general adult medical examination without abnormal findings: Secondary | ICD-10-CM | POA: Diagnosis not present

## 2023-06-27 LAB — PARATHYROID HORMONE, INTACT (NO CA): PTH: 60 pg/mL (ref 16–77)

## 2023-07-15 ENCOUNTER — Other Ambulatory Visit: Payer: Self-pay | Admitting: Internal Medicine

## 2023-09-01 ENCOUNTER — Other Ambulatory Visit: Payer: Self-pay | Admitting: Internal Medicine

## 2023-09-25 ENCOUNTER — Encounter: Payer: Self-pay | Admitting: Internal Medicine

## 2023-09-25 ENCOUNTER — Ambulatory Visit (INDEPENDENT_AMBULATORY_CARE_PROVIDER_SITE_OTHER): Payer: Medicare HMO | Admitting: Internal Medicine

## 2023-09-25 VITALS — BP 138/84 | HR 90 | Temp 99.0°F | Ht 68.0 in | Wt 184.0 lb

## 2023-09-25 DIAGNOSIS — M5431 Sciatica, right side: Secondary | ICD-10-CM | POA: Diagnosis not present

## 2023-09-25 MED ORDER — PREDNISONE 20 MG PO TABS: 40.0000 mg | ORAL_TABLET | Freq: Every day | ORAL | 0 refills | Status: DC

## 2023-09-25 NOTE — Progress Notes (Signed)
Subjective:    Patient ID: Caleb Gray, male    DOB: [birth year], 82 y.o.   MRN: [id number]  HPI Here due to right hip pain  Noted pain in right hip--points laterally Thinks it relates to the prior back surgery at times Then he noted pain posteriorly and towards pelvis anteriorly Tough to walk at first--but would loosen up over time Area above hip has been more sensitive at times--worse this morning so called in Does improve with walking around---though didn't get better 3 days ago At most radiates halfway down leg--and some tingling  Tried ibuprofen 400 bid--and that helped Tylenol didn't help the same way  Current Outpatient Medications on File Prior to Visit  Medication Sig Dispense Refill   ALPRAZolam (XANAX) 0.25 MG tablet Take 1 tablet (0.25 mg total) by mouth 2 (two) times daily as needed for anxiety. 20 tablet 0   amLODipine (NORVASC) 10 MG tablet TAKE 1 TABLET BY MOUTH EVERY DAY 90 tablet 3   aspirin 81 MG tablet Take 81 mg by mouth daily.       atorvastatin (LIPITOR) 20 MG tablet TAKE 1 TABLET BY MOUTH EVERYDAY AT BEDTIME 90 tablet 3   cholecalciferol (VITAMIN D3) 25 MCG (1000 UNIT) tablet Take 1,000 Units by mouth daily.     enalapril (VASOTEC) 20 MG tablet TAKE 1 TABLET BY MOUTH EVERY DAY 90 tablet 3   hydrochlorothiazide (MICROZIDE) 12.5 MG capsule TAKE 1 CAPSULE BY MOUTH EVERY DAY IN THE MORNING 90 capsule 2   Multiple Vitamins-Minerals (CENTRUM SILVER 50+MEN PO) Take by mouth.     Omega-3 Fatty Acids (FISH OIL) 1000 MG CAPS Take by mouth 2 (two) times daily.      No current facility-administered medications on file prior to visit.    Allergies  Allergen Reactions   Penicillins     Very dry and itchy skin, make throat "feel funny:    Past Medical History:  Diagnosis Date   ANEMIA-NOS    Arthritis    back   ASTHMA    Blood transfusion without reported diagnosis    12 years ago when had surgery for kidney mass   Cataract    Chronic diastolic heart  failure (HCC)    Colon cancer (HCC)    Aden[place] 1997 stage III T3, N1 s/p rectosigmoid resection   CORONARY ARTERY DISEASE    HYPERLIPIDEMIA    HYPERTENSION    Low back pain 10/2009   s/p laminectomy/decompression L4-5   Myocardial infarction [facility])    Right kidney mass 02/2007   benign (oncocytoma) s/p resection/total nephrectomy   UNSPECIFIED PERIPHERAL VASCULAR DISEASE     Past Surgical History:  Procedure Laterality Date   COLONOSCOPY     LUMBAR LAMINECTOMY/DECOMPRESSION MICRODISCECTOMY  10/2009   NEPHRECTOMY  02/2007   Right   POLYPECTOMY     Rectosigmoidectomy  1997    Family History  Problem Relation Age of Onset   Diabetes Mother    Colon cancer Mother    Heart disease Father    Alcohol abuse Other        Parents   Esophageal cancer Neg Hx    Rectal cancer Neg Hx    Stomach cancer Neg Hx    Pancreatic cancer Neg Hx     Social History   Socioeconomic History   Marital status: Widowed    Spouse name: Not on file   Number of children: 2   Years of education: Not on file   Highest education level: Not  on file  Occupational History   Occupation: IT--systems     Comment: retired  Tobacco Use   Smoking status: Former    Current packs/day: 0.00    Types: Cigarettes    Quit date: 11/11/1968    Years since quitting: 54.9    Passive exposure: Never   Smokeless tobacco: Never  Vaping Use   Vaping status: Never Used  Substance and Sexual Activity   Alcohol use: Yes    Alcohol/week: 0.0 standard drinks of alcohol    Comment: occasionally   Drug use: Never   Sexual activity: Not on file  Other Topics Concern   Not on file  Social History Narrative   Moved from [employer]   Widowed 2/20      Has living will    Daughter is health care POA   Would accept resuscitation   No prolonged machines or feeding tube   Social Determinants of Health   Financial Resource Strain: Not on file  Food Insecurity: Not on file  Transportation Needs: Not on file   Physical Activity: Not on file  Stress: Not on file  Social Connections: Not on file  Intimate Partner Violence: Not on file   Review of Systems No leg weakness    Objective:   Physical Exam Musculoskeletal:     Comments: No spine tenderness Some lateral left lumbar tenderness No hip bursa tenderness Fairly normal ROM in hips SLR negative  Neurological:     Comments: Gait--small steps but fairly normal No leg weakness            Assessment & Plan:

## 2023-09-25 NOTE — Assessment & Plan Note (Signed)
Discussed that this is generally self limited Should limit ibuprofen due to CKD Will try brief course of prednisone 40mg  x 3, 20mg  x 3

## 2023-12-26 DIAGNOSIS — H26493 Other secondary cataract, bilateral: Secondary | ICD-10-CM | POA: Diagnosis not present

## 2023-12-26 DIAGNOSIS — H353131 Nonexudative age-related macular degeneration, bilateral, early dry stage: Secondary | ICD-10-CM | POA: Diagnosis not present

## 2024-01-22 ENCOUNTER — Other Ambulatory Visit: Payer: Self-pay | Admitting: Internal Medicine

## 2024-02-18 ENCOUNTER — Other Ambulatory Visit: Payer: Self-pay | Admitting: Internal Medicine

## 2024-02-20 ENCOUNTER — Encounter: Payer: Self-pay | Admitting: Cardiology

## 2024-02-20 ENCOUNTER — Ambulatory Visit: Payer: Medicare HMO | Attending: Cardiology | Admitting: Cardiology

## 2024-02-20 VITALS — BP 144/80 | HR 107 | Ht 70.0 in | Wt 181.0 lb

## 2024-02-20 DIAGNOSIS — I10 Essential (primary) hypertension: Secondary | ICD-10-CM | POA: Diagnosis not present

## 2024-02-20 DIAGNOSIS — I2510 Atherosclerotic heart disease of native coronary artery without angina pectoris: Secondary | ICD-10-CM | POA: Diagnosis not present

## 2024-02-20 NOTE — Progress Notes (Signed)
 Cardiology Office Note:  .   Date:  02/20/2024  ID:  [name], DOB 1941/09/08, MRN [id number] PCP: Karie Schwalbe, MD  [facility] Providers Cardiologist:  Donato Schultz, MD    History of Present Illness: Caleb Gray is a 83 y.o. male Discussed the use of AI scribe software for clinical note transcription with the patient, who gave verbal consent to proceed.  History of Present Illness Caleb Gray is an 83 year old male with coronary artery disease who presents for follow-up.  He has a history of coronary artery disease, with a cardiac catheterization revealing 70% stenosis in the mid LAD and 90% stenosis in the distal RCA. Despite these findings, he remains asymptomatic and is managed medically. A low-risk nuclear stress test echocardiogram in 2009 showed a normal ejection fraction.  Hypertension is managed with enalapril 20 mg daily, hydrochlorothiazide 12.5 mg daily, and amlodipine 10 mg daily, with stable blood pressure control. Hyperlipidemia is managed with atorvastatin 20 mg daily, maintaining an LDL of 57 mg/dL. He also takes aspirin 81 mg daily.  He experiences dizziness upon rising in the morning, lasting about 30 seconds, which began approximately two weeks ago. He manages this by getting up slowly to allow time for balance adjustment.  He has stage 3A chronic kidney disease with a creatinine level of 1.47 mg/dL and a history of right nephrectomy in 2008. He avoids NSAIDs and manages low back pain with Tylenol. He maintains an active lifestyle, though limited by back pain and a recent sciatic nerve issue.  Socially, he is active, frequently caring for his granddaughters and engaging in activities such as walking at soccer games. 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      Studies Reviewed: Caleb Gray Kitchen   EKG Interpretation Date/Time:  Friday February 20 2024 10:41:33 EDT Ventricular Rate:  107 PR Interval:     QRS Duration:  94 QT Interval:  556 QTC Calculation: 742 R Axis:   80  Text Interpretation: Multifocal atrial tachycardia ST & T wave abnormality, consider inferior ischemia Possible Prolonged QT When compared with ECG of 10-Nov-2019 18:22, Nonspecific T wave abnormality, improved in Lateral leads Confirmed by Donato Schultz ([id number]) on 02/20/2024 11:10:00 AM    Results LABS HbA1c: 6.1% LDL: 57 mg/dL Creatinine: 1.91 mg/dL  DIAGNOSTIC Cardiac catheterization: 70% mid LAD stenosis, 90% distal RCA stenosis (02/11/2007) Nuclear stress test: Low risk (2009) Echocardiogram: Normal ejection fraction (2009) EKG: Normal Risk Assessment/Calculations:           Physical Exam:   VS:  BP (!) 144/80   Pulse (!) 107   Ht 5\' 10"  (1.778 m)   Wt 181 lb (82.1 kg)   BMI 25.97 kg/m    Wt Readings from Last 3 Encounters:  02/20/24 181 lb (82.1 kg)  09/25/23 184 lb (83.5 kg)  06/23/23 180 lb (81.6 kg)    GEN: Well nourished, well developed in no acute distress NECK: No JVD; No carotid bruits CARDIAC: RRR, no murmurs, no rubs, no gallops RESPIRATORY:  Clear to auscultation without rales, wheezing or rhonchi  ABDOMEN: Soft, non-tender, non-distended EXTREMITIES:  No edema; No deformity   ASSESSMENT AND PLAN: .    Assessment and Plan Assessment & Plan Coronary artery disease Coronary artery disease with 70% stenosis in the mid LAD and 90% stenosis in the distal RCA. Asymptomatic, with medical management deemed appropriate due to lack of significant symptoms. Previous cardiac  catheterization in 02/2007 and low-risk nuclear stress test echocardiogram in 2009 with normal ejection fraction. Current management includes aspirin and atorvastatin, effectively controlling cholesterol levels. - Continue aspirin 81 mg daily - Continue atorvastatin 20 mg daily  Essential hypertension Hypertension managed with enalapril, hydrochlorothiazide, and amlodipine. Blood pressure control is crucial to prevent  further cardiovascular complications. Advised to rise slowly from bed to manage dizziness potentially related to blood pressure fluctuations. - Continue enalapril 20 mg daily - Continue hydrochlorothiazide 12.5 mg daily - Continue amlodipine 10 mg daily  Hyperlipidemia Hyperlipidemia managed with atorvastatin. LDL level is well controlled at 57. - Continue atorvastatin 20 mg daily  Stage 3a chronic kidney disease Stage 3a chronic kidney disease with creatinine level of 1.47. Right nephrectomy. Advised to avoid NSAIDs to prevent further kidney damage. - Avoid NSAIDs  Dizziness Intermittent dizziness upon standing, likely due to postural changes and possibly related to blood pressure fluctuations. Symptoms last for a few seconds and resolve spontaneously. Advised to rise slowly from bed to allow balance centers to adjust. - Advise to rise slowly from bed  Low back pain Chronic low back pain, exacerbated by sciatica before the holidays. Remains active but reports walking is limited due to back pain. Engages in activities such as babysitting and attending soccer games, which help maintain activity levels. - Continue using Tylenol for pain management - Encourage regular physical activity as tolerated         Signed, Donato Schultz, MD

## 2024-02-20 NOTE — Patient Instructions (Signed)

## 2024-02-26 ENCOUNTER — Ambulatory Visit (INDEPENDENT_AMBULATORY_CARE_PROVIDER_SITE_OTHER): Admitting: Family Medicine

## 2024-02-26 ENCOUNTER — Ambulatory Visit: Payer: Self-pay

## 2024-02-26 ENCOUNTER — Encounter: Payer: Self-pay | Admitting: Family Medicine

## 2024-02-26 VITALS — BP 124/70 | HR 104 | Temp 98.6°F | Ht 70.0 in | Wt 182.0 lb

## 2024-02-26 DIAGNOSIS — J0100 Acute maxillary sinusitis, unspecified: Secondary | ICD-10-CM

## 2024-02-26 MED ORDER — AZITHROMYCIN 250 MG PO TABS: ORAL_TABLET | ORAL | 0 refills | Status: AC

## 2024-02-26 NOTE — Progress Notes (Signed)
 Caleb Newstrom T. Drenda Sobecki, MD, CAQ Sports Medicine [facility] at [facility] [address] [place], [address]  Phone: [phone]  FAX: [phone]  Caleb Gray - 83 y.o. male  MRN [id number]  Date of Birth: 1941/06/06  Date: 02/26/2024  PCP: Helaine Llanos, MD  Referral: Helaine Llanos, MD  Chief Complaint  Patient presents with   Facial Tenderness    Noticed some forehead tenderness 5 days ago. Also had some "streaking" head pain. The next day tenderness around left eye, ear, cheek, and upper jaw.  Was outside about 2 weeks ago and wondered if it was from allergies or sinus issues.    Subjective:   Caleb Gray is a 83 y.o. very pleasant male patient with Body mass index is 26.11 kg/m. who presents with the following:  2 wweeks ago was at a soccer game and came back with itchy eyes.  Usually would blow and sneeze.  Friday, went to cardiology and that was ok. Went home and did his luaundry Hit his L forehead.  He has some mild pain locally above the left eyebrow.  Sunday night was watching his TV and had some pain across his eyes. Some streaking in the headache - zapped Not a constant headache  Forehead was sore to the touch The next two days was moving some   Now he has some pain behind the left eye, as well L ear is hurtiing some more Jaw was bothering him and the L ear  Allergies not had too much, but he often does have allergies this time of year  Review of Systems is noted in the HPI, as appropriate  Objective:   BP 124/70 (BP Location: Left Arm, Patient Position: Sitting, Cuff Size: Large)   Pulse (!) 104   Temp 98.6 F (37 C) (Oral)   Ht 5\' 10"  (1.778 m)   Wt 182 lb (82.6 kg)   SpO2 97%   BMI 26.11 kg/m    Gen: WDWN, NAD; alert,appropriate and cooperative throughout exam  HEENT: Normocephalic and atraumatic. Throat clear, w/o exudate, no LAD, R TM clear, L TM - good landmarks, No fluid present. rhinnorhea.   Left frontal and maxillary sinuses: Tender, frontal and max Right frontal and maxillary sinuses: non-Tender  Neck: No ant or post LAD CV: RRR, No M/G/R Pulm: Breathing comfortably in no resp distress. no w/c/r Psych: full affect, pleasant   Laboratory and Imaging Data:  Assessment and Plan:     ICD-10-CM   1. Acute non-recurrent maxillary sinusitis  J01.00      Think he has some early sinusitis with some inciting allergies prior to this.  The good can be fairly reassured that the soft tissue injury from bumping his head is relatively minor.  Medication Management during today's office visit: Meds ordered this encounter  Medications   azithromycin  (ZITHROMAX ) 250 MG tablet    Sig: Take 2 tablets (500 mg total) by mouth daily for 1 day, THEN 1 tablet (250 mg total) daily for 4 days.    Dispense:  6 tablet    Refill:  0   There are no discontinued medications.  Orders placed today for conditions managed today: No orders of the defined types were placed in this encounter.   Disposition: No follow-ups on file.  [employer] speech-to-text software was used for transcription in this dictation.  Possible transcriptional errors can occur using Animal nutritionist.   Signed,  Ranny Bye. Elain Wixon, MD  Outpatient Encounter Medications as of 02/26/2024  Medication Sig   ALPRAZolam  (XANAX ) 0.25 MG tablet Take 1 tablet (0.25 mg total) by mouth 2 (two) times daily as needed for anxiety.   amLODipine  (NORVASC ) 10 MG tablet TAKE 1 TABLET BY MOUTH EVERY DAY   aspirin 81 MG tablet Take 81 mg by mouth daily.     atorvastatin  (LIPITOR) 20 MG tablet TAKE 1 TABLET BY MOUTH EVERYDAY AT BEDTIME   azithromycin  (ZITHROMAX ) 250 MG tablet Take 2 tablets (500 mg total) by mouth daily for 1 day, THEN 1 tablet (250 mg total) daily for 4 days.   cholecalciferol (VITAMIN D3) 25 MCG (1000 UNIT) tablet Take 1,000 Units by mouth daily.   enalapril  (VASOTEC ) 20 MG tablet TAKE 1 TABLET BY MOUTH EVERY  DAY   hydrochlorothiazide  (MICROZIDE ) 12.5 MG capsule TAKE 1 CAPSULE BY MOUTH EVERY DAY IN THE MORNING   Multiple Vitamins-Minerals (CENTRUM SILVER 50+MEN PO) Take by mouth.   Omega-3 Fatty Acids (FISH OIL) 1000 MG CAPS Take by mouth 2 (two) times daily.    No facility-administered encounter medications on file as of 02/26/2024.

## 2024-02-26 NOTE — Telephone Encounter (Signed)
 Copied from CRM [id number]. Topic: Clinical - Red Word Triage >> Feb 26, 2024  8:15 AM Gurney Maxin H wrote: [facility] that prompted transfer to Nurse Triage: Soreness and pain to the touch on left side of face from forehead to jaw.   Chief Complaint: facial pain Symptoms: sharp shooting pain "like lightning bolt" from forehead and other spots down the face to jaw, sensitivity to touch and facial movement, prior dizziness  Frequency: intermittent Pertinent Negatives: Patient denies severe pain, rash, redness, fever, swelling, toothache, nasal discharge, nasal congestion, clicking sensation in jaw joint, SOB, chest pain, back pain different from usual, sweating, chest/jaw/shoulder/arm/neck pain Disposition: [] 911 / [] ED /[] Urgent Care (no appt availability in office) / [] Appointment(In office/virtual)/ []  [facility]/ [] Home Care/ [x] Refused Recommended Disposition /[] [facility]/ []  Follow-up with PCP Additional Notes: Pt reporting that he started having pain on Sunday night that comes and goes, sharp and shooting pain "like lightning bolt" down the face, sometimes starts in forehead, other times elsewhere on the face, shoots down face into jaw. Pt reporting the pain has "dulled down" this morning but not experienced this sensation before. Pt confirms no chest pain, SOB, fever, rash, swelling. Pt reporting that he bumped his head on Friday afternoon in laundry room but no symptoms until Sunday night. Pt confirms doing slow position changes for orthostatic dizziness but got "very dizzy" the other night "delayed" from usual after standing up. Advised pt go to ED for symptoms especially with hx of MI. Pt refusing, pt thinks he has a sinus infection, confirms no nasal discharge or congestion. Advised that current recommendation is to go to ED, sending [facility] message to office for them to call pt back as pt requests. Alerted [facility] of ED refusal. Please advise further recommendations.   Reason for  Disposition  [1] New-onset jaw pain AND [2] unknown cause AND [3] at least one cardiac risk factor (e.g., 45 years or older, diabetes, high cholesterol, hypertension, obesity, smoker or strong family history of heart disease)  Answer Assessment - Initial Assessment Questions 1. ONSET: "When did the pain start?" (e.g., minutes, hours, days)     Sunday night 2. ONSET: "Does the pain come and go, or has it been constant since it started?" (e.g., constant, intermittent, fleeting)     Comes and goes like a stabbing pain and no pain in between 3. SEVERITY: "How bad is the pain?"   (Scale 1-10; mild, moderate or severe)   - MILD (1-3): doesn't interfere with normal activities    - MODERATE (4-7): interferes with normal activities or awakens from sleep    - SEVERE (8-10): excruciating pain, unable to do any normal activities      3-4-5 middle 4. LOCATION: "Where does it hurt?"      Forehead and shoots down the face, centralizes in forehead but has moved from one spot to other some tenderness at eyes as well, movement of face worsens pain 5. RASH: "Is there any redness, rash, or swelling of the face?"     denies 6. FEVER: "Do you have a fever?" If Yes, ask: "What is it, how was it measured, and when did it start?"      denies 7. OTHER SYMPTOMS: "Do you have any other symptoms?" (e.g., fever, toothache, nasal discharge, nasal congestion, clicking sensation in jaw joint)     Denies  Bumped head Friday afternoon in laundry room, no symptoms that day Pain started Sunday night Painful if touch forehead and shoots across face  like lightning bolt, pretty sharp Pain dulled down this morning Not felt this pain the way it's acting now can't say I've ever experienced this before Not having dizziness with waking up in last few mornings because doing slow position changes Got very dizzy the other night when urinating during night Not trying to fight with you but sounds like sinus infection to me Don't have  emergency situation  Protocols used: Face Pain-A-AH

## 2024-02-26 NOTE — Patient Instructions (Signed)
 Flonase (Fluticasone) over the counter - 2 sprays in each nostril once a day

## 2024-02-26 NOTE — Telephone Encounter (Signed)
 I spoke with Caleb Gray Caleb Gray said having headaches on and off for a while today dull pain on lt side of face that is starting to subside. Slight  H/A but no weakness in arms or legs,no difficulty in talking. And no rash,CP or SOB.Caleb Gray thinks this is more allergy related than anything else and declines ED until sees Dr Geralyn Knee 02/26/24 at 2:40. UC & ED precautions given and Caleb Gray voiced understanding. sending note to Dr Geralyn Knee.

## 2024-02-27 ENCOUNTER — Encounter: Payer: Self-pay | Admitting: Family Medicine

## 2024-02-27 NOTE — Telephone Encounter (Signed)
 I appreciate Dr Geralyn Knee being able to see him

## 2024-02-29 ENCOUNTER — Emergency Department
Admission: EM | Admit: 2024-02-29 | Discharge: 2024-02-29 | Disposition: A | Discharge status: Home / Self Care | Attending: Emergency Medicine | Admitting: Emergency Medicine

## 2024-02-29 ENCOUNTER — Other Ambulatory Visit: Payer: Self-pay

## 2024-02-29 DIAGNOSIS — Z85828 Personal history of other malignant neoplasm of skin: Secondary | ICD-10-CM | POA: Diagnosis not present

## 2024-02-29 DIAGNOSIS — N189 Chronic kidney disease, unspecified: Secondary | ICD-10-CM | POA: Insufficient documentation

## 2024-02-29 DIAGNOSIS — J45909 Unspecified asthma, uncomplicated: Secondary | ICD-10-CM | POA: Diagnosis not present

## 2024-02-29 DIAGNOSIS — I2510 Atherosclerotic heart disease of native coronary artery without angina pectoris: Secondary | ICD-10-CM | POA: Insufficient documentation

## 2024-02-29 DIAGNOSIS — R21 Rash and other nonspecific skin eruption: Secondary | ICD-10-CM | POA: Diagnosis present

## 2024-02-29 DIAGNOSIS — Z85038 Personal history of other malignant neoplasm of large intestine: Secondary | ICD-10-CM | POA: Insufficient documentation

## 2024-02-29 DIAGNOSIS — I129 Hypertensive chronic kidney disease with stage 1 through stage 4 chronic kidney disease, or unspecified chronic kidney disease: Secondary | ICD-10-CM | POA: Diagnosis not present

## 2024-02-29 DIAGNOSIS — B028 Zoster with other complications: Secondary | ICD-10-CM

## 2024-02-29 DIAGNOSIS — B029 Zoster without complications: Secondary | ICD-10-CM | POA: Diagnosis not present

## 2024-02-29 MED ORDER — VALACYCLOVIR HCL 500 MG PO TABS: 500.0000 mg | ORAL_TABLET | Freq: Two times a day (BID) | ORAL | 0 refills | Status: AC

## 2024-02-29 MED ORDER — HYDROCODONE-ACETAMINOPHEN 5-325 MG PO TABS: 1.0000 | ORAL_TABLET | Freq: Three times a day (TID) | ORAL | 0 refills | Status: DC | PRN

## 2024-02-29 MED ORDER — FLUORESCEIN SODIUM 1 MG OP STRP
1.0000 | ORAL_STRIP | Freq: Once | OPHTHALMIC | Status: AC
  Administered 2024-02-29: 1 via OPHTHALMIC
  Filled 2024-02-29: qty 1

## 2024-02-29 NOTE — ED Provider Notes (Signed)
 [facility] Provider Note    Event Date/Time   First MD Initiated Contact with Patient 02/29/24 1001     (approximate)   History   Rash   HPI  Caleb Gray is a 83 y.o. male   presents to the ED with complaint of left forehead rash and some edema around the left eye for approximately 1 week.  He was seen by his PCP on Thursday which time he was thought to have a sinus infection and prescribed Zithromax  and Flonase.  Patient states that the rash on his forehead has continued but he does not have a rash anyplace else on his body.  He believes that he is having allergic reaction to the Flonase which has not helped with his symptoms.  Patient denies any visual changes and reports that the rash on his forehead only hurts when it is touched.  Patient has a history of CAD, asthma, hypertension, right kidney mass, chronic kidney disease, history of colon cancer, basal cell carcinoma.      Physical Exam   Triage Vital Signs: ED Triage Vitals [02/29/24 0948]  Encounter Vitals Group     BP 130/83     Systolic BP Percentile      Diastolic BP Percentile      Pulse Rate (!) 104     Resp 20     Temp 98.3 F (36.8 C)     Temp Source Oral     SpO2 95 %     Weight 175 lb (79.4 kg)     Height 5\' 10"  (1.778 m)     Head Circumference      Peak Flow      Pain Score 0     Pain Loc      Pain Education      Exclude from Growth Chart     Most recent vital signs: Vitals:   02/29/24 0948 02/29/24 1202  BP: 130/83 (!) 168/87  Pulse: (!) 104 93  Resp: 20 17  Temp: 98.3 F (36.8 C) 98.5 F (36.9 C)  SpO2: 95% 96%     General: Awake, no distress.  Alert, talkative, non toxic in appearance. CV:  Good peripheral perfusion.  Resp:  Normal effort.  Abd:  No distention.  Other:  Left forehead with erythematous area that does not cross the midline.  There are some pustules and some crusting within the eyebrow.  Mild erythema to the upper lid.  Fluorescein  stain  was applied to the left and no fluorescein  take up on the cornea was noted.  There is minimal inflammation to the conjunctiva.  PERRLA, EOMI's.   ED Results / Procedures / Treatments   Labs (all labs ordered are listed, but only abnormal results are displayed) Labs Reviewed - No data to display    PROCEDURES:  Critical Care performed:   Procedures   MEDICATIONS ORDERED IN ED: Medications  fluorescein  ophthalmic strip 1 strip (1 strip Left Eye Given by Other 02/29/24 1152)     IMPRESSION / MDM / ASSESSMENT AND PLAN / ED COURSE  I reviewed the triage vital signs and the nursing notes.   Differential diagnosis includes, but is not limited to, contact dermatitis, allergic dermatitis, herpes zoster, left frontal sinusitis.  83 year old male presents to the ED with complaint of a rash to his left forehead that has not improved with Zithromax  and Flonase that was prescribed for him by his PCP on Thursday.  On exam rash is consistent with herpes  zoster.  I explained to him the need to follow-up with his ophthalmologist even though currently he denies any visual changes.  He agrees that he will call his ophthalmologist in [place] to make an appointment.  A prescription for hydrocodone  was sent to the pharmacy to take if needed especially at night to sleep.  He is aware that this is a narcotic and should not drive while taking the medication.  We also discussed taking a antiviral to reduce chances of postherpetic pain.  Patient is slightly outside of the 72-hour window but patient agrees to medication.  He is return to the emergency department if he worsening of his symptoms or urgent concerns over the holiday weekend.      Patient's presentation is most consistent with acute illness / injury with system symptoms.  FINAL CLINICAL IMPRESSION(S) / ED DIAGNOSES   Final diagnoses:  Herpes zoster dermatitis     Rx / DC Orders   ED Discharge Orders          Ordered     HYDROcodone -acetaminophen  (NORCO/VICODIN) 5-325 MG tablet  Every 8 hours PRN        02/29/24 1155    valACYclovir  (VALTREX ) 500 MG tablet  2 times daily        02/29/24 1155             Note:  This document was prepared using Dragon voice recognition software and may include unintentional dictation errors.   Stafford Eagles, PA-C 02/29/24 1500    Ruth Cove, MD 02/29/24 [phone]

## 2024-02-29 NOTE — ED Triage Notes (Signed)
 Pt to ED stating he is having allergic rx to flonase.   Pt has rash on L forehead and swelling above L eye since 1 week. States this area is tender. States this was already present before he started the abx and flonase. Saw PCP on Thursday (3d ago) who prescribed zithromax  and flonase. Pt states rash to face has just gotten progressively worse and that swelling around L eye started yesterday. Pt denies other areas on his body with same type of rash. Rash is raised.   Pt states he thinks having allergic rx to flonase because it didn't help him blow his nose.

## 2024-02-29 NOTE — Discharge Instructions (Signed)
 Call make an appointment with your ophthalmologist at [facility].  Let them know that you were diagnosed with shingles.  If any severe worsening of your symptoms return to the emergency department. Prescriptions were sent to your pharmacy to begin taking today.  The pain medication is every 8 hours if needed for severe pain.  This medication could cause drowsiness and increase your risk for falling.  Do not drive or operate machinery while taking this medication.

## 2024-03-01 DIAGNOSIS — B0230 Zoster ocular disease, unspecified: Secondary | ICD-10-CM | POA: Diagnosis not present

## 2024-03-08 ENCOUNTER — Telehealth: Payer: Self-pay

## 2024-03-08 ENCOUNTER — Telehealth: Payer: Self-pay | Admitting: *Deleted

## 2024-03-08 DIAGNOSIS — I10 Essential (primary) hypertension: Secondary | ICD-10-CM

## 2024-03-08 DIAGNOSIS — I2510 Atherosclerotic heart disease of native coronary artery without angina pectoris: Secondary | ICD-10-CM

## 2024-03-08 NOTE — Progress Notes (Signed)
 Complex Care Management Note  Care Guide Note 03/08/2024 Name: KAHLIL KIRN MRN: [id number] DOB: 09/01/1941  Oscar Blazing is a 83 y.o. year old male who sees Helaine Llanos, MD for primary care. I reached out to Oscar Blazing by phone today to offer complex care management services.  Mr. Starosta was given information about Complex Care Management services today including:   The Complex Care Management services include support from the care team which includes your Nurse Care Manager, Clinical Social Worker, or Pharmacist.  The Complex Care Management team is here to help remove barriers to the health concerns and goals most important to you. Complex Care Management services are voluntary, and the patient may decline or stop services at any time by request to their care team member.   Complex Care Management Consent Status: Patient did not agree to participate in complex care management services at this time.  Encounter Outcome:  Patient Refused  Kandis Ormond, CMA   [facility], [facility] Guide Direct Dial: [phone]  Fax: [phone] Website: [email or web address]

## 2024-03-22 DIAGNOSIS — B0230 Zoster ocular disease, unspecified: Secondary | ICD-10-CM | POA: Diagnosis not present

## 2024-03-22 DIAGNOSIS — H02884 Meibomian gland dysfunction left upper eyelid: Secondary | ICD-10-CM | POA: Diagnosis not present

## 2024-03-22 DIAGNOSIS — H02881 Meibomian gland dysfunction right upper eyelid: Secondary | ICD-10-CM | POA: Diagnosis not present

## 2024-05-04 DIAGNOSIS — D2270 Melanocytic nevi of unspecified lower limb, including hip: Secondary | ICD-10-CM | POA: Diagnosis not present

## 2024-05-04 DIAGNOSIS — L821 Other seborrheic keratosis: Secondary | ICD-10-CM | POA: Diagnosis not present

## 2024-05-04 DIAGNOSIS — D2260 Melanocytic nevi of unspecified upper limb, including shoulder: Secondary | ICD-10-CM | POA: Diagnosis not present

## 2024-05-04 DIAGNOSIS — Z1283 Encounter for screening for malignant neoplasm of skin: Secondary | ICD-10-CM | POA: Diagnosis not present

## 2024-05-04 DIAGNOSIS — D239 Other benign neoplasm of skin, unspecified: Secondary | ICD-10-CM | POA: Diagnosis not present

## 2024-05-04 DIAGNOSIS — L814 Other melanin hyperpigmentation: Secondary | ICD-10-CM | POA: Diagnosis not present

## 2024-05-04 DIAGNOSIS — L578 Other skin changes due to chronic exposure to nonionizing radiation: Secondary | ICD-10-CM | POA: Diagnosis not present

## 2024-05-04 DIAGNOSIS — D225 Melanocytic nevi of trunk: Secondary | ICD-10-CM | POA: Diagnosis not present

## 2024-05-04 DIAGNOSIS — B0229 Other postherpetic nervous system involvement: Secondary | ICD-10-CM | POA: Diagnosis not present

## 2024-05-04 DIAGNOSIS — Z85828 Personal history of other malignant neoplasm of skin: Secondary | ICD-10-CM | POA: Diagnosis not present

## 2024-05-26 DIAGNOSIS — I252 Old myocardial infarction: Secondary | ICD-10-CM | POA: Diagnosis not present

## 2024-05-26 DIAGNOSIS — Z809 Family history of malignant neoplasm, unspecified: Secondary | ICD-10-CM | POA: Diagnosis not present

## 2024-05-26 DIAGNOSIS — E785 Hyperlipidemia, unspecified: Secondary | ICD-10-CM | POA: Diagnosis not present

## 2024-05-26 DIAGNOSIS — Z833 Family history of diabetes mellitus: Secondary | ICD-10-CM | POA: Diagnosis not present

## 2024-05-26 DIAGNOSIS — M1990 Unspecified osteoarthritis, unspecified site: Secondary | ICD-10-CM | POA: Diagnosis not present

## 2024-05-26 DIAGNOSIS — I110 Hypertensive heart disease with heart failure: Secondary | ICD-10-CM | POA: Diagnosis not present

## 2024-05-26 DIAGNOSIS — J45909 Unspecified asthma, uncomplicated: Secondary | ICD-10-CM | POA: Diagnosis not present

## 2024-05-26 DIAGNOSIS — I509 Heart failure, unspecified: Secondary | ICD-10-CM | POA: Diagnosis not present

## 2024-05-26 DIAGNOSIS — Z7982 Long term (current) use of aspirin: Secondary | ICD-10-CM | POA: Diagnosis not present

## 2024-05-26 DIAGNOSIS — Z87891 Personal history of nicotine dependence: Secondary | ICD-10-CM | POA: Diagnosis not present

## 2024-05-26 DIAGNOSIS — I2510 Atherosclerotic heart disease of native coronary artery without angina pectoris: Secondary | ICD-10-CM | POA: Diagnosis not present

## 2024-05-26 DIAGNOSIS — Z8249 Family history of ischemic heart disease and other diseases of the circulatory system: Secondary | ICD-10-CM | POA: Diagnosis not present

## 2024-06-23 ENCOUNTER — Encounter: Payer: Self-pay | Admitting: Internal Medicine

## 2024-06-23 ENCOUNTER — Ambulatory Visit (INDEPENDENT_AMBULATORY_CARE_PROVIDER_SITE_OTHER): Payer: Medicare HMO | Admitting: Internal Medicine

## 2024-06-23 VITALS — BP 136/84 | HR 96 | Temp 98.5°F | Ht 68.0 in | Wt 178.0 lb

## 2024-06-23 DIAGNOSIS — I10 Essential (primary) hypertension: Secondary | ICD-10-CM | POA: Diagnosis not present

## 2024-06-23 DIAGNOSIS — N1831 Chronic kidney disease, stage 3a: Secondary | ICD-10-CM | POA: Diagnosis not present

## 2024-06-23 DIAGNOSIS — Z0000 Encounter for general adult medical examination without abnormal findings: Secondary | ICD-10-CM

## 2024-06-23 DIAGNOSIS — R7303 Prediabetes: Secondary | ICD-10-CM

## 2024-06-23 DIAGNOSIS — E785 Hyperlipidemia, unspecified: Secondary | ICD-10-CM

## 2024-06-23 LAB — RENAL FUNCTION PANEL
Albumin: 4.2 g/dL (ref 3.5–5.2)
BUN: 19 mg/dL (ref 6–23)
CO2: 30 meq/L (ref 19–32)
Calcium: 9.6 mg/dL (ref 8.4–10.5)
Chloride: 101 meq/L (ref 96–112)
Creatinine, Ser: 1.37 mg/dL (ref 0.40–1.50)
GFR: 47.89 mL/min — ABNORMAL LOW (ref >60.00)
Glucose, Bld: 127 mg/dL — ABNORMAL HIGH (ref 70–99)
Phosphorus: 3.2 mg/dL (ref 2.3–4.6)
Potassium: 3.5 meq/L (ref 3.5–5.1)
Sodium: 140 meq/L (ref 135–145)

## 2024-06-23 LAB — HEPATIC FUNCTION PANEL
ALT: 28 U/L (ref 0–53)
AST: 26 U/L (ref 0–37)
Albumin: 4.2 g/dL (ref 3.5–5.2)
Alkaline Phosphatase: 71 U/L (ref 39–117)
Bilirubin, Direct: 0.2 mg/dL (ref 0.0–0.3)
Total Bilirubin: 1 mg/dL (ref 0.2–1.2)
Total Protein: 6.5 g/dL (ref 6.0–8.3)

## 2024-06-23 LAB — CBC
HCT: 41.7 % (ref 39.0–52.0)
Hemoglobin: 14.5 g/dL (ref 13.0–17.0)
MCHC: 34.7 g/dL (ref 30.0–36.0)
MCV: 94.9 fl (ref 78.0–100.0)
Platelets: 247 K/uL (ref 150.0–400.0)
RBC: 4.39 Mil/uL (ref 4.22–5.81)
RDW: 13.3 % (ref 11.5–15.5)
WBC: 5.7 K/uL (ref 4.0–10.5)

## 2024-06-23 LAB — LIPID PANEL
Cholesterol: 127 mg/dL (ref 0–200)
HDL: 51.2 mg/dL (ref >39.00)
LDL Cholesterol: 61 mg/dL (ref 0–99)
NonHDL: 75.82
Total CHOL/HDL Ratio: 2
Triglycerides: 72 mg/dL (ref 0.0–149.0)
VLDL: 14.4 mg/dL (ref 0.0–40.0)

## 2024-06-23 LAB — VITAMIN D 25 HYDROXY (VIT D DEFICIENCY, FRACTURES): VITD: 36.7 ng/mL (ref 30.00–100.00)

## 2024-06-23 LAB — HEMOGLOBIN A1C: Hgb A1c MFr Bld: 6.2 % (ref 4.6–6.5)

## 2024-06-23 NOTE — Progress Notes (Signed)
 Subjective:    Patient ID: Caleb Gray, male    DOB: [birth year], 83 y.o.   MRN: [id number]  HPI Here for Medicare wellness visit and follow up of chronic health conditions Reviewed advanced directives Reviewed other doctors--Dr Kozlowski--ophthal, Dr Esmeralda, Dr Skains--cardiology, Dr Christena, Dr Jeri No hospitalizations or surgery in the past year No regular exercise--just keeping up house No tobacco  Rare beer Vision is okay New hearing aides No falls Mood generally okay but has stressors Independent with instrumental ADLs No memory issues  Had shingles in April---seen in ER Ophthalmology follow up since in V1 dermatome Still some sensitivity  Sciatica did clear up  No chest pain or SOB No dizziness or syncope No edema No palpitations  No headache  Mood is better Still grieves for wife--that is better Now daughter getting divorce---he is a resource for grandkids Has never used the xanax   GFR generally in the 50's lately Up and down some  Current Outpatient Medications on File Prior to Visit  Medication Sig Dispense Refill   ALPRAZolam  (XANAX ) 0.25 MG tablet Take 1 tablet (0.25 mg total) by mouth 2 (two) times daily as needed for anxiety. 20 tablet 0   amLODipine  (NORVASC ) 10 MG tablet TAKE 1 TABLET BY MOUTH EVERY DAY 90 tablet 3   aspirin 81 MG tablet Take 81 mg by mouth daily.       atorvastatin  (LIPITOR) 20 MG tablet TAKE 1 TABLET BY MOUTH EVERYDAY AT BEDTIME 90 tablet 3   cholecalciferol (VITAMIN D3) 25 MCG (1000 UNIT) tablet Take 1,000 Units by mouth daily.     enalapril  (VASOTEC ) 20 MG tablet TAKE 1 TABLET BY MOUTH EVERY DAY 90 tablet 3   hydrochlorothiazide  (MICROZIDE ) 12.5 MG capsule TAKE 1 CAPSULE BY MOUTH EVERY DAY IN THE MORNING 90 capsule 2   Multiple Vitamins-Minerals (CENTRUM SILVER 50+MEN PO) Take by mouth.     Omega-3 Fatty Acids (FISH OIL) 1000 MG CAPS Take by mouth 2 (two) times daily.      No current facility-administered  medications on file prior to visit.    Allergies  Allergen Reactions   Penicillins     Very dry and itchy skin, make throat feel funny:    Past Medical History:  Diagnosis Date   ANEMIA-NOS    Arthritis    back   ASTHMA    Blood transfusion without reported diagnosis    12 years ago when had surgery for kidney mass   Cataract    Chronic diastolic heart failure (HCC)    Colon cancer (HCC)    [place] 1997 stage III T3, N1 s/p rectosigmoid resection   CORONARY ARTERY DISEASE    HYPERLIPIDEMIA    HYPERTENSION    Low back pain 10/2009   s/p laminectomy/decompression L4-5   Myocardial infarction [facility])    Right kidney mass 02/2007   benign (oncocytoma) s/p resection/total nephrectomy   UNSPECIFIED PERIPHERAL VASCULAR DISEASE     Past Surgical History:  Procedure Laterality Date   COLONOSCOPY     LUMBAR LAMINECTOMY/DECOMPRESSION MICRODISCECTOMY  10/2009   NEPHRECTOMY  02/2007   Right   POLYPECTOMY     Rectosigmoidectomy  1997    Family History  Problem Relation Age of Onset   Diabetes Mother    Colon cancer Mother    Heart disease Father    Alcohol abuse Other        Parents   Esophageal cancer Neg Hx    Rectal cancer Neg Hx    Stomach  cancer Neg Hx    Pancreatic cancer Neg Hx     Social History   Socioeconomic History   Marital status: Widowed    Spouse name: Not on file   Number of children: 2   Years of education: Not on file   Highest education level: Not on file  Occupational History   Occupation: IT--systems     Comment: retired  Tobacco Use   Smoking status: Former    Current packs/day: 0.00    Types: Cigarettes    Quit date: 11/11/1968    Years since quitting: 55.6    Passive exposure: Never   Smokeless tobacco: Never  Vaping Use   Vaping status: Never Used  Substance and Sexual Activity   Alcohol use: Yes    Alcohol/week: 0.0 standard drinks of alcohol    Comment: occasionally   Drug use: Never   Sexual activity: Not on file  Other  Topics Concern   Not on file  Social History Narrative   Moved from [place]   Widowed 2/20      Has living will    Daughter is health care POA   Would accept resuscitation   No prolonged machines or feeding tube   Social Drivers of Health   Financial Resource Strain: Not on file  Food Insecurity: Not on file  Transportation Needs: Not on file  Physical Activity: Not on file  Stress: Not on file  Social Connections: Not on file  Intimate Partner Violence: Not on file   Review of Systems Appetite is fine Weight is stable Sleep is variable---early morning awakening and trouble getting back. Not tired during day Wears seat belt Teeth are okay--keeps up with dentist No suspicious skin lesions Rare heartburn--no meds. No dysphagia Bowels move okay Voids okay--slow stream but seems to empty Some right hip/back pain--not bad    Objective:   Physical Exam Constitutional:      Appearance: Normal appearance.  HENT:     Mouth/Throat:     Pharynx: No oropharyngeal exudate or posterior oropharyngeal erythema.  Eyes:     Conjunctiva/sclera: Conjunctivae normal.     Pupils: Pupils are equal, round, and reactive to light.  Cardiovascular:     Rate and Rhythm: Normal rate and regular rhythm.     Pulses: Normal pulses.     Heart sounds:     No gallop.     Comments: Faint systolic murmur Pulmonary:     Effort: Pulmonary effort is normal.     Breath sounds: Normal breath sounds. No wheezing or rales.  Abdominal:     Palpations: Abdomen is soft.     Tenderness: There is no abdominal tenderness.  Musculoskeletal:     Cervical back: Neck supple.     Comments: Trace ankle edema  Lymphadenopathy:     Cervical: No cervical adenopathy.  Skin:    Findings: No lesion or rash.  Neurological:     General: No focal deficit present.     Mental Status: He is alert and oriented to person, place, and time.     Comments: Mini-cog---normal  Psychiatric:        Mood and Affect: Mood  normal.        Behavior: Behavior normal.            Assessment & Plan:

## 2024-06-23 NOTE — Assessment & Plan Note (Signed)
 He needs to give up sugared drinks, etc

## 2024-06-23 NOTE — Progress Notes (Signed)
 Hearing Screening - Comments:: Has new hearing aids. Wearing them today.  Vision Screening - Comments:: May 2025

## 2024-06-23 NOTE — Assessment & Plan Note (Signed)
 BP Readings from Last 3 Encounters:  06/23/24 136/84  02/29/24 (!) 168/87  02/26/24 124/70   Controlled on the hydrochlorothiazide  12.5mg  and enalapril  20 daily. Also amlodipine  10

## 2024-06-23 NOTE — Assessment & Plan Note (Signed)
 Will recheck Is on the enalapril 

## 2024-06-23 NOTE — Assessment & Plan Note (Signed)
No problems with atorvastatin 

## 2024-06-23 NOTE — Assessment & Plan Note (Signed)
 I have personally reviewed the Medicare Annual Wellness questionnaire and have noted 1. The patient's medical and social history 2. Their use of alcohol, tobacco or illicit drugs 3. Their current medications and supplements 4. The patient's functional ability including ADL's, fall risks, home safety risks and hearing or visual             impairment. 5. Diet and physical activities 6. Evidence for depression or mood disorders  The patients weight, height, BMI and visual acuity have been recorded in the chart I have made referrals, counseling and provided education to the patient based review of the above and I have provided the pt with a written personalized care plan for preventive services.  I have provided you with a copy of your personalized plan for preventive services. Please take the time to review along with your updated medication list.  Done with cancer screening Discussed doing some exercise Flu/COVID/RSV this fall Consider shingrix next year

## 2024-06-24 ENCOUNTER — Ambulatory Visit: Payer: Self-pay | Admitting: Internal Medicine

## 2024-06-24 LAB — PARATHYROID HORMONE, INTACT (NO CA): PTH: 23 pg/mL (ref 16–77)

## 2024-06-28 ENCOUNTER — Other Ambulatory Visit: Payer: Self-pay | Admitting: Internal Medicine

## 2024-07-28 ENCOUNTER — Other Ambulatory Visit: Payer: Self-pay

## 2024-07-28 MED ORDER — ENALAPRIL MALEATE 20 MG PO TABS: 20.0000 mg | ORAL_TABLET | Freq: Every day | ORAL | 3 refills | Status: AC

## 2024-09-23 ENCOUNTER — Telehealth: Payer: Self-pay

## 2024-09-23 NOTE — Telephone Encounter (Signed)
 Pt needs TOC in the next few months. Thank you

## 2025-06-29 ENCOUNTER — Encounter
